# Patient Record
Sex: Male | Born: 1955 | Race: White | Hispanic: No | Marital: Single | Smoking: Former smoker
Health system: Southern US, Community
[De-identification: ages and names within clinical notes are randomized; demographics above are authoritative.]

## PROBLEM LIST (undated history)

## (undated) DIAGNOSIS — E669 Obesity, unspecified: Secondary | ICD-10-CM

## (undated) DIAGNOSIS — K219 Gastro-esophageal reflux disease without esophagitis: Secondary | ICD-10-CM

## (undated) DIAGNOSIS — I10 Essential (primary) hypertension: Secondary | ICD-10-CM

## (undated) DIAGNOSIS — E785 Hyperlipidemia, unspecified: Secondary | ICD-10-CM

## (undated) DIAGNOSIS — R0602 Shortness of breath: Secondary | ICD-10-CM

## (undated) DIAGNOSIS — I959 Hypotension, unspecified: Secondary | ICD-10-CM

## (undated) DIAGNOSIS — N183 Chronic kidney disease, stage 3 (moderate): Secondary | ICD-10-CM

## (undated) DIAGNOSIS — I2510 Atherosclerotic heart disease of native coronary artery without angina pectoris: Secondary | ICD-10-CM

## (undated) DIAGNOSIS — Z87891 Personal history of nicotine dependence: Secondary | ICD-10-CM

## (undated) DIAGNOSIS — T462X5A Adverse effect of other antidysrhythmic drugs, initial encounter: Secondary | ICD-10-CM

## (undated) DIAGNOSIS — N189 Chronic kidney disease, unspecified: Secondary | ICD-10-CM

## (undated) DIAGNOSIS — I739 Peripheral vascular disease, unspecified: Secondary | ICD-10-CM

## (undated) DIAGNOSIS — Z9989 Dependence on other enabling machines and devices: Secondary | ICD-10-CM

## (undated) DIAGNOSIS — I779 Disorder of arteries and arterioles, unspecified: Secondary | ICD-10-CM

## (undated) DIAGNOSIS — F329 Major depressive disorder, single episode, unspecified: Secondary | ICD-10-CM

## (undated) DIAGNOSIS — J984 Other disorders of lung: Secondary | ICD-10-CM

## (undated) DIAGNOSIS — I428 Other cardiomyopathies: Secondary | ICD-10-CM

## (undated) DIAGNOSIS — F32A Depression, unspecified: Secondary | ICD-10-CM

## (undated) DIAGNOSIS — G4733 Obstructive sleep apnea (adult) (pediatric): Secondary | ICD-10-CM

## (undated) DIAGNOSIS — I4819 Other persistent atrial fibrillation: Secondary | ICD-10-CM

## (undated) DIAGNOSIS — Z7901 Long term (current) use of anticoagulants: Secondary | ICD-10-CM

## (undated) DIAGNOSIS — E781 Pure hyperglyceridemia: Secondary | ICD-10-CM

## (undated) HISTORY — DX: Dependence on other enabling machines and devices: Z99.89

## (undated) HISTORY — DX: Other disorders of lung: T46.2X5A

## (undated) HISTORY — DX: Long term (current) use of anticoagulants: Z79.01

## (undated) HISTORY — DX: Hyperlipidemia, unspecified: E78.5

## (undated) HISTORY — DX: Atherosclerotic heart disease of native coronary artery without angina pectoris: I25.10

## (undated) HISTORY — DX: Peripheral vascular disease, unspecified: I73.9

## (undated) HISTORY — DX: Obesity, unspecified: E66.9

## (undated) HISTORY — DX: Pure hyperglyceridemia: E78.1

## (undated) HISTORY — DX: Gastro-esophageal reflux disease without esophagitis: K21.9

## (undated) HISTORY — DX: Essential (primary) hypertension: I10

## (undated) HISTORY — DX: Depression, unspecified: F32.A

## (undated) HISTORY — DX: Personal history of nicotine dependence: Z87.891

## (undated) HISTORY — DX: Other persistent atrial fibrillation: I48.19

## (undated) HISTORY — DX: Disorder of arteries and arterioles, unspecified: I77.9

## (undated) HISTORY — DX: Chronic kidney disease, unspecified: N18.9

## (undated) HISTORY — DX: Major depressive disorder, single episode, unspecified: F32.9

## (undated) HISTORY — DX: Obstructive sleep apnea (adult) (pediatric): G47.33

## (undated) HISTORY — DX: Other cardiomyopathies: I42.8

## (undated) HISTORY — DX: Other disorders of lung: J98.4

## (undated) HISTORY — DX: Chronic kidney disease, stage 3 (moderate): N18.3

---

## 1981-07-04 HISTORY — PX: APPENDECTOMY: SHX54

## 1997-10-02 ENCOUNTER — Encounter (HOSPITAL_COMMUNITY): Admission: RE | Admit: 1997-10-02 | Discharge: 1997-12-31 | Payer: Self-pay | Admitting: Cardiology

## 1997-10-11 ENCOUNTER — Inpatient Hospital Stay (HOSPITAL_COMMUNITY): Admission: EM | Admit: 1997-10-11 | Discharge: 1997-10-16 | Payer: Self-pay | Admitting: Emergency Medicine

## 1997-10-29 ENCOUNTER — Ambulatory Visit (HOSPITAL_COMMUNITY): Admission: RE | Admit: 1997-10-29 | Discharge: 1997-10-29 | Payer: Self-pay | Admitting: Family Medicine

## 1998-09-07 ENCOUNTER — Ambulatory Visit (HOSPITAL_COMMUNITY): Admission: RE | Admit: 1998-09-07 | Discharge: 1998-09-07 | Payer: Self-pay | Admitting: Cardiology

## 1998-09-07 ENCOUNTER — Encounter: Payer: Self-pay | Admitting: Cardiology

## 1999-09-09 ENCOUNTER — Ambulatory Visit (HOSPITAL_COMMUNITY): Admission: RE | Admit: 1999-09-09 | Discharge: 1999-09-09 | Payer: Self-pay | Admitting: Cardiology

## 1999-09-09 ENCOUNTER — Encounter: Payer: Self-pay | Admitting: Cardiology

## 2002-05-21 ENCOUNTER — Emergency Department (HOSPITAL_COMMUNITY): Admission: EM | Admit: 2002-05-21 | Discharge: 2002-05-21 | Payer: Self-pay | Admitting: Emergency Medicine

## 2002-05-21 ENCOUNTER — Encounter: Payer: Self-pay | Admitting: Emergency Medicine

## 2003-02-05 ENCOUNTER — Ambulatory Visit: Admission: RE | Admit: 2003-02-05 | Discharge: 2003-02-05 | Payer: Self-pay | Admitting: Family Medicine

## 2004-05-03 ENCOUNTER — Encounter: Admission: RE | Admit: 2004-05-03 | Discharge: 2004-05-03 | Payer: Self-pay | Admitting: Cardiology

## 2004-05-07 ENCOUNTER — Ambulatory Visit (HOSPITAL_COMMUNITY): Admission: RE | Admit: 2004-05-07 | Discharge: 2004-05-07 | Payer: Self-pay | Admitting: Cardiology

## 2004-05-11 ENCOUNTER — Inpatient Hospital Stay (HOSPITAL_COMMUNITY): Admission: RE | Admit: 2004-05-11 | Discharge: 2004-05-16 | Payer: Self-pay | Admitting: Cardiothoracic Surgery

## 2004-05-11 HISTORY — PX: CORONARY ARTERY BYPASS GRAFT: SHX141

## 2004-06-11 ENCOUNTER — Encounter: Admission: RE | Admit: 2004-06-11 | Discharge: 2004-06-11 | Payer: Self-pay | Admitting: Cardiothoracic Surgery

## 2005-12-16 ENCOUNTER — Ambulatory Visit: Payer: Self-pay | Admitting: Family Medicine

## 2006-03-24 ENCOUNTER — Ambulatory Visit: Payer: Self-pay | Admitting: Family Medicine

## 2006-11-10 ENCOUNTER — Ambulatory Visit: Payer: Self-pay | Admitting: Family Medicine

## 2006-11-28 ENCOUNTER — Ambulatory Visit: Payer: Self-pay | Admitting: Family Medicine

## 2007-04-06 ENCOUNTER — Ambulatory Visit: Payer: Self-pay | Admitting: Family Medicine

## 2007-05-07 ENCOUNTER — Ambulatory Visit: Payer: Self-pay | Admitting: Family Medicine

## 2007-06-29 ENCOUNTER — Ambulatory Visit: Payer: Self-pay | Admitting: Family Medicine

## 2007-10-25 ENCOUNTER — Ambulatory Visit: Payer: Self-pay | Admitting: Family Medicine

## 2007-10-26 ENCOUNTER — Ambulatory Visit: Payer: Self-pay | Admitting: Family Medicine

## 2008-01-03 ENCOUNTER — Ambulatory Visit: Payer: Self-pay | Admitting: Family Medicine

## 2008-06-30 ENCOUNTER — Ambulatory Visit: Payer: Self-pay | Admitting: Family Medicine

## 2008-08-14 ENCOUNTER — Ambulatory Visit: Payer: Self-pay | Admitting: Family Medicine

## 2009-03-10 ENCOUNTER — Ambulatory Visit: Payer: Self-pay | Admitting: Family Medicine

## 2009-08-13 ENCOUNTER — Ambulatory Visit: Payer: Self-pay | Admitting: Family Medicine

## 2009-08-25 ENCOUNTER — Encounter (INDEPENDENT_AMBULATORY_CARE_PROVIDER_SITE_OTHER): Payer: Self-pay

## 2009-08-26 ENCOUNTER — Ambulatory Visit: Payer: Self-pay | Admitting: Gastroenterology

## 2009-09-17 HISTORY — PX: NM MYOVIEW LTD: HXRAD82

## 2009-09-22 ENCOUNTER — Ambulatory Visit: Payer: Self-pay | Admitting: Gastroenterology

## 2009-09-24 ENCOUNTER — Encounter: Payer: Self-pay | Admitting: Gastroenterology

## 2010-04-09 ENCOUNTER — Ambulatory Visit: Payer: Self-pay | Admitting: Family Medicine

## 2010-04-26 ENCOUNTER — Ambulatory Visit: Payer: Self-pay | Admitting: Family Medicine

## 2010-08-03 NOTE — Miscellaneous (Signed)
Summary: Lec previsit  Clinical Lists Changes  Medications: Added new medication of MOVIPREP 100 GM  SOLR (PEG-KCL-NACL-NASULF-NA ASC-C) As per prep instructions. - Signed Rx of MOVIPREP 100 GM  SOLR (PEG-KCL-NACL-NASULF-NA ASC-C) As per prep instructions.;  #1 x 0;  Signed;  Entered by: Ulis Rias RN;  Authorized by: Mardella Layman MD [facility];  Method used: [facility] [phone] *, [address] [place], [place]  [address], Ph: [phone] or [phone], Fax: [phone] Observations: Added new observation of NKA: T (08/26/2009 14:19)    Prescriptions: MOVIPREP 100 GM  SOLR (PEG-KCL-NACL-NASULF-NA ASC-C) As per prep instructions.  #1 x 0   Entered by:   Ulis Rias RN   Authorized by:   Mardella Layman MD [facility]   Signed by:   Ulis Rias RN on 08/26/2009   Method used:   [facility]ronically to        [facility] [phone] * (retail)       [address]       [place], [place]  [address]       Ph: [phone] or [phone]       Fax: [phone]   RxID:   [id number]

## 2010-08-03 NOTE — Letter (Signed)
Summary: [facility] Instructions  [facility]  [address] [place], [place] [address]   Phone: [phone]  Fax: [phone]       Kennie Galati    September 25, 1955    MRN: [id number]        Procedure Day /Date:  Tuesday 09/22/2009     Arrival Time: 8:00 am      Procedure Time: 9:00 am     Location of Procedure:                    _x _  [facility] (4th Floor)                        PREPARATION FOR COLONOSCOPY WITH MOVIPREP   Starting 5 days prior to your procedure Thursday 3/17 do not eat nuts, seeds, popcorn, corn, beans, peas,  salads, or any raw vegetables.  Do not take any fiber supplements (e.g. Metamucil, Citrucel, and Benefiber).  THE DAY BEFORE YOUR PROCEDURE         DATE: Monday 3/21 1.  Drink clear liquids the entire day-NO SOLID FOOD  2.  Do not drink anything colored red or purple.  Avoid juices with pulp.  No orange juice.  3.  Drink at least 64 oz. (8 glasses) of fluid/clear liquids during the day to prevent dehydration and help the prep work efficiently.  CLEAR LIQUIDS INCLUDE: Water Jello Ice Popsicles Tea (sugar ok, no milk/cream) Powdered fruit flavored drinks Coffee (sugar ok, no milk/cream) Gatorade Juice: apple, white grape, white cranberry  Lemonade Clear bullion, consomm, broth Carbonated beverages (any kind) Strained chicken noodle soup Hard Candy                             4.  In the morning, mix first dose of MoviPrep solution:    Empty 1 Pouch A and 1 Pouch B into the disposable container    Add lukewarm drinking water to the top line of the container. Mix to dissolve    Refrigerate (mixed solution should be used within 24 hrs)  5.  Begin drinking the prep at 5:00 p.m. The MoviPrep container is divided by 4 marks.   Every 15 minutes drink the solution down to the next mark (approximately 8 oz) until the full liter is complete.   6.  Follow completed prep with 16 oz of clear liquid of your choice (Nothing red  or purple).  Continue to drink clear liquids until bedtime.  7.  Before going to bed, mix second dose of MoviPrep solution:    Empty 1 Pouch A and 1 Pouch B into the disposable container    Add lukewarm drinking water to the top line of the container. Mix to dissolve    Refrigerate  THE DAY OF YOUR PROCEDURE      DATE: Tuesday 3/22  Beginning at 4:00 a.m. (5 hours before procedure):         1. Every 15 minutes, drink the solution down to the next mark (approx 8 oz) until the full liter is complete.  2. Follow completed prep with 16 oz. of clear liquid of your choice.    3. You may drink clear liquids until 7:00 am  (2 HOURS BEFORE PROCEDURE).   MEDICATION INSTRUCTIONS  Unless otherwise instructed, you should take regular prescription medications with a small sip of water   as early as possible the morning of  your procedure..  .  Additional medication instructions: Do not take Diovan am of procedure         OTHER INSTRUCTIONS  You will need a responsible adult at least 56 years of age to accompany you and drive you home.   This person must remain in the waiting room during your procedure.  Wear loose fitting clothing that is easily removed.  Leave jewelry and other valuables at home.  However, you may wish to bring a book to read or  an iPod/MP3 player to listen to music as you wait for your procedure to start.  Remove all body piercing jewelry and leave at home.  Total time from sign-in until discharge is approximately 2-3 hours.  You should go home directly after your procedure and rest.  You can resume normal activities the  day after your procedure.  The day of your procedure you should not:   Drive   Make legal decisions   Operate machinery   Drink alcohol   Return to work  You will receive specific instructions about eating, activities and medications before you leave.    The above instructions have been reviewed and explained to me by   Ulis Rias RN  August 26, 2009 2:36 PM     I fully understand and can verbalize these instructions _____________________________ Date _________

## 2010-08-03 NOTE — Procedures (Signed)
Summary: Colonoscopy  Patient: [name] [name] Note: All result statuses are Final unless otherwise noted.  Tests: (1) Colonoscopy (COL)   COL Colonoscopy           DONE     [facility]     [address]     [place], [place]  [address]           COLONOSCOPY PROCEDURE REPORT           PATIENT:  [name] [name], [name] [name]  MR#:  [id number]     BIRTHDATE:  1955-08-31, 53 yrs. old  GENDER:  male           ENDOSCOPIST:  Refoel Palladino. Jarold Motto, MD, [facility]     Referred by:  Sharlot Gowda, M.D.           PROCEDURE DATE:  09/22/2009     PROCEDURE:  Colonoscopy with snare polypectomy     ASA CLASS:  Class II     INDICATIONS:  Routine Risk Screening           MEDICATIONS:   Fentanyl 50 mcg IV, Versed 5 mg           DESCRIPTION OF PROCEDURE:   After the risks benefits and     alternatives of the procedure were thoroughly explained, informed     consent was obtained.  Digital rectal exam was performed and     revealed no abnormalities.   The LB CF-[id number] endoscope     was introduced through the anus and advanced to the cecum, which     was identified by both the appendix and ileocecal valve, without     limitations.  The quality of the prep was excellent, using     MoviPrep.  The instrument was then slowly withdrawn as the colon     was fully examined.     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     Moderate diverticulosis was found sigmoid to descending  There     were multiple polyps identified and removed. sigmoid to descending     2-6 mm flat polyps hot snare excised  This was otherwise a normal     examination of the colon.   Retroflexed views in the rectum     revealed no abnormalities.    The scope was then withdrawn from     the patient and the procedure completed.           COMPLICATIONS:  None           ENDOSCOPIC IMPRESSION:     1) Av malformation     2)  Moderate diverticulosis in the sigmoid to descending     3) Polyps, multiple in the sigmoid to descending     4) Otherwise normal examination     RECOMMENDATIONS:     1) If the polyp(s) removed today are proven to be adenomatous     (pre-cancerous) polyps, you will need a repeat colonoscopy in 5     years. Otherwise you should continue to follow colorectal cancer     screening guidelines for "routine risk" patients with colonoscopy     in 10 years.     2) high fiber diet           REPEAT EXAM:  No  ______________________________     Vania Rea. Jarold Motto, MD, [facility]           CC:           n.     eSIGNED:   Vania Rea. Patterson at 09/22/2009 09:26 AM           Marjorie Smolder, [id number]  Note: An exclamation mark (!) indicates a result that was not dispersed into the flowsheet. Document Creation Date: 09/22/2009 9:26 AM _______________________________________________________________________  (1) Order result status: Final Collection or observation date-time: 09/22/2009 09:19 Requested date-time:  Receipt date-time:  Reported date-time:  Referring Physician:   Ordering Physician: Sheryn Bison [phone]) Specimen Source:  Source: Launa Grill Order Number: [id number] Lab site:   Appended Document: Colonoscopy     Procedures Next Due Date:    Colonoscopy: 09/2014

## 2010-08-03 NOTE — Letter (Signed)
Summary: Patient Notice- Polyp Results  [facility]  [address] [place], [place] [address]   Phone: [phone]  Fax: [phone]        September 24, 2009 MRN: [id number]    [name] [name] [address] [place], [place]  [address]    Dear Mr. Mertens,  I am pleased to inform you that the colon polyp(s) removed during your recent colonoscopy was (were) found to be benign (no cancer detected) upon pathologic examination.  I recommend you have a repeat colonoscopy examination in 5_ years to look for recurrent polyps, as having colon polyps increases your risk for having recurrent polyps or even colon cancer in the future.  Should you develop new or worsening symptoms of abdominal pain, bowel habit changes or bleeding from the rectum or bowels, please schedule an evaluation with either your primary care physician or with me.  Additional information/recommendations:  xx__ No further action with gastroenterology is needed at this time. Please      follow-up with your primary care physician for your other healthcare      needs.  __ Please call [phone] to schedule a return visit to review your      situation.  __ Please keep your follow-up visit as already scheduled.  __ Continue treatment plan as outlined the day of your exam.  Please call us if you are having persistent problems or have questions about your condition that have not been fully answered at this time.  Sincerely,  Mardella Layman MD [facility]  This letter has been electronically signed by your physician.  Appended Document: Patient Notice- Polyp Results Letter mailed 3.25.11

## 2010-08-09 ENCOUNTER — Encounter (INDEPENDENT_AMBULATORY_CARE_PROVIDER_SITE_OTHER): Payer: Managed Care, Other (non HMO) | Admitting: Family Medicine

## 2010-08-09 DIAGNOSIS — I10 Essential (primary) hypertension: Secondary | ICD-10-CM

## 2010-08-09 DIAGNOSIS — F17200 Nicotine dependence, unspecified, uncomplicated: Secondary | ICD-10-CM

## 2010-08-09 DIAGNOSIS — I2510 Atherosclerotic heart disease of native coronary artery without angina pectoris: Secondary | ICD-10-CM

## 2010-08-09 DIAGNOSIS — Z79899 Other long term (current) drug therapy: Secondary | ICD-10-CM

## 2010-09-02 ENCOUNTER — Ambulatory Visit (INDEPENDENT_AMBULATORY_CARE_PROVIDER_SITE_OTHER): Payer: Managed Care, Other (non HMO) | Admitting: Family Medicine

## 2010-09-02 DIAGNOSIS — T887XXA Unspecified adverse effect of drug or medicament, initial encounter: Secondary | ICD-10-CM

## 2010-11-16 ENCOUNTER — Other Ambulatory Visit: Payer: Self-pay | Admitting: Cardiology

## 2010-11-16 ENCOUNTER — Ambulatory Visit
Admission: RE | Admit: 2010-11-16 | Discharge: 2010-11-16 | Disposition: A | Discharge status: Home / Self Care | Payer: Managed Care, Other (non HMO) | Source: Ambulatory Visit | Attending: Cardiology | Admitting: Cardiology

## 2010-11-16 DIAGNOSIS — I2510 Atherosclerotic heart disease of native coronary artery without angina pectoris: Secondary | ICD-10-CM

## 2010-11-18 ENCOUNTER — Ambulatory Visit (HOSPITAL_COMMUNITY)
Admission: RE | Admit: 2010-11-18 | Discharge: 2010-11-19 | Disposition: A | Discharge status: Home / Self Care | Payer: Managed Care, Other (non HMO) | Source: Ambulatory Visit | Attending: Cardiology | Admitting: Cardiology

## 2010-11-18 DIAGNOSIS — F3289 Other specified depressive episodes: Secondary | ICD-10-CM | POA: Insufficient documentation

## 2010-11-18 DIAGNOSIS — Z9861 Coronary angioplasty status: Secondary | ICD-10-CM | POA: Insufficient documentation

## 2010-11-18 DIAGNOSIS — E785 Hyperlipidemia, unspecified: Secondary | ICD-10-CM | POA: Insufficient documentation

## 2010-11-18 DIAGNOSIS — I10 Essential (primary) hypertension: Secondary | ICD-10-CM | POA: Insufficient documentation

## 2010-11-18 DIAGNOSIS — I739 Peripheral vascular disease, unspecified: Secondary | ICD-10-CM | POA: Insufficient documentation

## 2010-11-18 DIAGNOSIS — Q602 Renal agenesis, unspecified: Secondary | ICD-10-CM | POA: Insufficient documentation

## 2010-11-18 DIAGNOSIS — R9439 Abnormal result of other cardiovascular function study: Secondary | ICD-10-CM | POA: Insufficient documentation

## 2010-11-18 DIAGNOSIS — F17200 Nicotine dependence, unspecified, uncomplicated: Secondary | ICD-10-CM | POA: Insufficient documentation

## 2010-11-18 DIAGNOSIS — Z951 Presence of aortocoronary bypass graft: Secondary | ICD-10-CM | POA: Insufficient documentation

## 2010-11-18 DIAGNOSIS — I2510 Atherosclerotic heart disease of native coronary artery without angina pectoris: Secondary | ICD-10-CM | POA: Insufficient documentation

## 2010-11-18 DIAGNOSIS — Q605 Renal hypoplasia, unspecified: Secondary | ICD-10-CM | POA: Insufficient documentation

## 2010-11-18 DIAGNOSIS — F329 Major depressive disorder, single episode, unspecified: Secondary | ICD-10-CM | POA: Insufficient documentation

## 2010-11-18 HISTORY — PX: CARDIAC CATHETERIZATION: SHX172

## 2010-11-18 LAB — BASIC METABOLIC PANEL
BUN: 32 mg/dL — ABNORMAL HIGH (ref 6–23)
CO2: 25 mEq/L (ref 19–32)
Calcium: 9.2 mg/dL (ref 8.4–10.5)
Chloride: 99 mEq/L (ref 96–112)
Creatinine, Ser: 1.86 mg/dL — ABNORMAL HIGH (ref 0.4–1.5)
GFR calc Af Amer: 46 mL/min — ABNORMAL LOW (ref >60)
GFR calc non Af Amer: 38 mL/min — ABNORMAL LOW (ref >60)
Glucose, Bld: 136 mg/dL — ABNORMAL HIGH (ref 70–99)
Potassium: 3.8 mEq/L (ref 3.5–5.1)
Sodium: 134 mEq/L — ABNORMAL LOW (ref 135–145)

## 2010-11-19 LAB — BASIC METABOLIC PANEL
BUN: 27 mg/dL — ABNORMAL HIGH (ref 6–23)
CO2: 27 mEq/L (ref 19–32)
Calcium: 8.8 mg/dL (ref 8.4–10.5)
Chloride: 101 mEq/L (ref 96–112)
Creatinine, Ser: 1.67 mg/dL — ABNORMAL HIGH (ref 0.4–1.5)
GFR calc Af Amer: 52 mL/min — ABNORMAL LOW (ref >60)
GFR calc non Af Amer: 43 mL/min — ABNORMAL LOW (ref >60)
Glucose, Bld: 107 mg/dL — ABNORMAL HIGH (ref 70–99)
Potassium: 4.3 mEq/L (ref 3.5–5.1)
Sodium: 137 mEq/L (ref 135–145)

## 2010-11-19 NOTE — Discharge Summary (Signed)
NAME:  ANIELLO, CHRISTOPOULOS NO.:  [id number]   MEDICAL RECORD NO.:  [id number]          PATIENT TYPE:  INP   LOCATION:  2023                         FACILITY:  [facility]   PHYSICIAN:  Kerin Perna, M.D.  DATE OF BIRTH:  1955/07/25   DATE OF ADMISSION:  05/11/2004  DATE OF DISCHARGE:  05/16/2004                                 DISCHARGE SUMMARY   ANTICIPATED DATE OF DISCHARGE:  May 16, 2004.   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   DISCHARGE/SECONDARY DIAGNOSES:  1.  Class IV progressive angina with severe three-vessel coronary artery      disease.  2.  Hypertension.  3.  Gout.  4.  Tobacco dependence.  5.  Status post cervical laminectomy for degenerative arthritis.  6.  Status post appendectomy.  7.  Gastrointestinal reflux disease.  8.  Hyperlipidemia.  9.  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  No renal artery stenosis identified.  10. No known drug allergies.  11. Postoperative left base consolidation with history of recent pneumonia.      He was started on a 10-day course of Avelox on May 14, 2004.  12. History of carotid artery bruits with carotid Dopplers at [facility] in October of 2005 showing no significant      carotid artery disease.   PROCEDURES:  On May 11, 2004, coronary artery bypass graft x 4 using a  left internal mammary artery to the left anterior descending, saphenous vein  graft to the diagonal, sequential saphenous vein graft to the first obtuse  marginal and second obtuse marginal, endoscopic vein harvesting from the  right leg.  Surgeon:  Kerin Perna, M.D.   BRIEF HISTORY:  Mr. Dragan is a 55 year old Caucasian male with a history of  smoking.  He has a prior history of coronary artery disease and anterior  myocardial infarction in 1999.  He was treated with an LAD stent.  He  saw  Gaspar Garbe B. Little, M.D., in October of this year with recurrent chest pain.  He had a positive stress test and was therefore scheduled for a cardiac  catheterization by Dr. Clarene Duke, which was done on May 07, 2004.  This  demonstrated restenosis of the LAD and high-grade disease of the dominant  circumflex system.  His ejection fraction was preserved and he was felt to  be a candidate for surgical revascularization.  Kerin Perna, M.D., of  [facility] was consulted.  Dr. Donata Clay examined the patient in the post  catheterization holding area to review the results of the cardiac  catheterization with the patient.  He felt that Mr. Reicher would benefit  from coronary artery bypass graft surgery.  After discussing the risks,  benefits and alternatives, Mr. Gaertner agreed to proceed.  Both Dr. Donata Clay  and Dr. Clarene Duke felt that Mr. Greenstreet was stable for discharge home with plans  to proceed with surgery on May 11, 2004.   HOSPITAL  COURSE:  On May 11, 2004, Mr. Hudock was electively admitted to  [facility] to undergo coronary artery bypass grafting as discussed  above.  Overall he tolerated the procedure well.  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  Dr. Donata Clay felt that exposure of any vessel other  than the LAD for future operations or followup would be futile.  He did not  receive any blood products during the operation.  The mammary artery was a  small vessel with good flow.  The quality of the vein harvested was good.  Postoperatively he was transferred to the surgical intensive care unit.  Later that evening, he remained sedated on the ventilator.  His chest tube  output was minimal.  Postoperative laboratories were stable other than a  slightly elevated blood glucose.  His chest x-ray was also stable.  It was  noted by anesthesia that he was a difficult intubation.  Precautions were   taken prior to extubating him.  However, by postoperative day #1, he had  been extubated and was neurologically intact.  He was hemodynamically stable  in sinus rhythm.  He did show evidence of mild volume excess and diuretic  therapy was initiated.  His followup EKG showed normal sinus rhythm with T-  wave inversion in leads 2, 3 and aVF.  Serial cardiac enzymes were checked  as a precautions.  Although his troponin was elevated at 3.2, it was noted  to decrease to 2.7.  This was felt stable considering he had just undergone  coronary artery bypass grafting.  Otherwise Mr. Eves denied chest pain  other than incisional soreness.  Mr. Dondero remained in the intensive care  unit until postoperative day #2.   By postoperative day #3, Mr. Siegman was on unit 2000.  He was  hemodynamically stable.  His blood pressure was 136/80.  His heart rate was  in the 80s and in sinus rhythm with occasional premature atrial  contractions.  His Lopressor had just been increased to 25 mg twice daily.  He was afebrile.  He was on 2 L per nasal cannula saturating 97%.  Laboratories showed a decreasing blood glucose at 115.  His H&H were stable  at 10.9 and 31.2.  Kidney function was also stable and he was voiding  without difficulty.  His morning chest x-ray, however, showed a tiny left  apical pneumothorax after removal of his chest tubes.  There were also  effusions with mild interstitial edema and a consolidation at the left base.  He was also noted to have a coarse cough.  It was felt he might be  developing a postoperative bronchitis and he was empirically started on  Avelox and methophenazine.  He was also treated with albuterol and Atrovent  nebulizer treatments postoperatively.  On exam, his heart had a regular rate  and rhythm.  His lungs had a few intermittent faint wheezes throughout.  His abdominal exam was benign.  His bowels were working appropriately.  His  extremities showed trace edema.  His  incisions were clean and dry without  signs of infection.  By this time, he was ambulating out in the hallways  with cardiac rehabilitation and was noted to have a steady gait.  He had  also undergo a tobacco cessation consult.  It was felt that if his  respiratory status remained stable over the next 48 hours, he was weaned  from  supplementation oxygen and his heart rhythm remained stable that he  would be ready for discharge home on May 16, 2004.   LABORATORY DATA:  Laboratories on May 14, 2004, showed a decreasing  white blood count of 17.2, a hemoglobin of 11.6, hematocrit 33.7 and  platelet count 168.  Sodium 136, potassium 4.0, blood glucose 115, BUN 16,  creatinine 1.0, chloride 99 and CO2 29.  Liver enzymes on May 07, 2004,  showed a total bilirubin of 0.6, alkaline phosphatase 96, SGOT 14 and SGPT  14.  His total protein was 6.0, albumin 3.3 and calcium 9.0.   DISCHARGE MEDICATIONS:  1.  Enteric-coated aspirin 325 mg one p.o. daily.  2.  Lopressor 25 mg one p.o. b.i.d.  3.  Lipitor 40 mg one p.o. daily.  4.  Allopurinol 300 mg one p.o. daily.  5.  Buspirone 50 mg one p.o. b.i.d.  6.  Celexa 20 mg one p.o. daily.  7.  Prevacid 30 mg one p.o. daily.  8.  Avelox 400 mg one p.o. daily x 7 days.  9.  Guaifenesin 600 mg one p.o. q.12h. p.r.n.  10. Albuterol MDI two puffs q.4h. p.r.n.  11. Tylox one to two tablets p.o. q.4h. p.r.n. pain.   ACTIVITY:  He is instructed to avoid driving or heavy lifting more than 10  pounds.  He was encouraged to continue daily walking and breathing  exercises.   DIET:  He is to follow a low-fat, low-salt diet.   WOUND CARE:  He may shower daily and clean his incisions gently with mild  soap and water.  He should notify the [facility] office if he develops a fever  greater than 101 degrees or redness or drainage from his incision site.   FOLLOWUP:  He is to call to schedule a two-week followup with Gaspar Garbe B.  Little, M.D.  He is to  follow up with Dr. Donata Clay at the [facility] office in  approximately three weeks.  The [facility] office will contact him regarding a  specific appointment date and time.  He is to have a chest x-ray one before  this appointment at the [facility].  He was instructed to  bring his chest x-ray films with him to his appointment with Dr. Donata Clay.  He should schedule a two to four-week followup to see Sharlot Gowda, M.D., to  reevaluate his blood sugars and address any other medical issues.      Revonda Standard   AWZ/MEDQ  D:  05/14/2004  T:  05/15/2004  Job:  [id number]   cc:   Thereasa Solo. Little, M.D.  [address]  [address]  [place]  [place] [address]  Fax: [phone]   Sharlot Gowda, M.D.  [address]  [place], [place] [address]  Fax: [phone]

## 2010-11-19 NOTE — Cardiovascular Report (Signed)
NAME:  [name] [name], [name] [name] NO.:  [id number]   MEDICAL RECORD NO.:  [id number]          PATIENT TYPE:  OIB   LOCATION:  2899                         FACILITY:  [facility]   PHYSICIAN:  Thereasa Solo. Little, M.D. DATE OF BIRTH:  13-Jul-1955   DATE OF PROCEDURE:  05/07/2004  DATE OF DISCHARGE:                              CARDIAC CATHETERIZATION   This 55 year old male has known coronary disease having had a stent placed  in his LAD in 1999.  He had somewhat atypical chest pain and was evaluated  on April 12, 2004.  Nuclear study was performed that showed subtle  anterior ischemia and because of this the patient is brought in for  outpatient cardiac catheterization.  He is a cigarette smoker, has  hypertension, hyperlipidemia, and had a family member die at age 59 of  coronary artery disease.   PROCEDURE:  After obtaining informed consent, the patient was prepped and  draped in the usual sterile fashion.  Following local anesthetic with 1%  Xylocaine, the Seldinger technique was employed and a 5 [place] introducer  sheath was placed into the right femoral artery.  Selective left and right  coronary arteriography and ventriculography in the RAO projection,  evaluation of the left internal mammary artery, and a distal aortogram was  performed.   COMPLICATIONS:  None.   EQUIPMENT:  5 French diagnostic Judkins configuration catheters.   TOTAL CONTRAST:  120 mL.   RESULTS:   HEMODYNAMIC MONITORING:  Central aortic pressure was 123/74.  Left  ventricular pressure was 120/13.  There was no aortic valve gradient noted  at time of pullback.   VENTRICULOGRAPHY:  Ventriculography in the RAO projection revealed posterior  basilar segment to be mildly hypokinetic.  The ejection fraction was 50%.  The end-diastolic pressure was 27, and there was mitral valve prolapse  without mitral regurgitation seen.   DISTAL AORTOGRAM:  Distal aortogram showed a single renal artery to the  right  kidney with no renal artery stenosis.  There is no left kidney.  There  was no significant iliac disease.   Evaluation of the left internal mammary artery showed it to be widely  patent.   On fluoroscopy, there was a stent in the LAD and mild calcification in the  same vessel on fluoro.   1.  Left main normal.  2.  LAD:  The left anterior descending was a very large dominant vessel.  It      crossed the apex of the heart and supplied the entire posterior septum.      There was stent in the most proximal portion almost at the ostium.  In      the distal portion of the stent was a napkin-ring hypolucent area.      There was TIMI-3 flow through this.  There was a small first diagonal.  3.  Circumflex:  The circumflex was a dominant vessel.  The ostium of the      first OM was 70% narrowed and this was a large vessel.  The ongoing      circumflex at the takeoff of OM-2  had a long 90% area proximal to and      distal to the takeoff of OM-2, and OM-2 was a large vessel.  The ostium      of this vessel was also involved.  The ongoing circumflex past this      lesion appeared to be free of significant disease.  4.  The right coronary artery was a small nondominant vessel with moderate      irregularities.   CONCLUSION:  1.  Low normal left ventricular function with 50% ejection fraction.  2.  Elevated left ventricular end-diastolic pressure at 27.  3.  Single right kidney.  4.  No significant aortoiliac disease.  5.  Patent left internal mammary artery.  6.  Napkin-ring lucent area in the terminal portion of the stent in the      proximal left anterior descending in a very dominant left anterior      descending and severe disease in the circumflex involving the ostium of      OM-1 and the ostium of OM-2 with long lesion in the circumflex itself.   At this point, I have recommended he be evaluated by [facility] for consideration  of revascularization.  I would consider letting him go home and  come back  first of the week for revascularization.       ABL/MEDQ  D:  05/07/2004  T:  05/07/2004  Job:  [id number]   cc:   Sharlot Gowda, M.D.  [address]  [place], [place] [address]  Fax: [phone]

## 2010-11-19 NOTE — Consult Note (Signed)
NAME:  [name] [name], WISLER NO.:  [id number]   MEDICAL RECORD NO.:  [id number]          PATIENT TYPE:  OIB   LOCATION:  2899                         FACILITY:  [facility]   PHYSICIAN:  Kathlee Nations Trigt III, M.D.DATE OF BIRTH:  Jul 15, 1955   DATE OF CONSULTATION:  05/07/2004  DATE OF DISCHARGE:                                   CONSULTATION   PHYSICIAN REQUESTING CONSULTATION:  Thereasa Solo. Little, M.D.   PRIMARY CARE PHYSICIAN:  Sharlot Gowda, M.D.   REASON FOR CONSULTATION:  Severe three-vessel coronary artery disease with  class IV angina.   CHIEF COMPLAINT:  Chest pain.   HISTORY OF PRESENT ILLNESS:  I was asked to evaluate this 55 year old white  male for potential surgical coronary revascularization for recently  diagnosed severe three-vessel coronary artery disease.  The patient has a  history of coronary artery disease and sustained an inferior MI in 1999.  At  that time, he was treated with a stent in the proximal LAD, which is a  dominant vessel wrapping around the apex and supplying the inferior wall.  He has been followed by Dr. Clarene Duke with intermittent stress tests.  A  cardiac cath was last done in 2003.  Over the past several weeks, he has had  exertional chest pain relieved by rest.  A stress test showed possible  ischemic changes from his prior studies and a diagnostic catheterization was  performed today.  This demonstrated ejection fraction of 50% with inferior  hypokinesia.  His LVEDP was 24 mmHg.  His LAD was a large dominant vessel  supplying the apex and the inferior wall, and septum.  There is a stenosis  in the proximal stent.  The circumflex was a large vessel with 70% stenosis  of the OM-1, 90% stenosis of the OM-2 and 80% stenosis of the distal  circumflex.  The right coronary was non-dominant and small.  There is no  evidence of aortic regurgitation or mitral regurgitation.  Based on the  patient's coronary anatomy, symptoms, and stress test, he  was felt to be a  candidate for surgical revascularization.  He is currently stable in the  post-cath holding area.   PAST MEDICAL HISTORY:  1.  Hypertension.  2.  Gout.  3.  Active smoking.  4.  No known drug allergies.  5.  Status post cervical laminectomy for degenerative arthritis.  6.  Status post appendectomy.  7.  GERD.   HOME MEDICATIONS:  1.  Lipitor 40 mg daily.  2.  Allopurinol 300 mg daily.  3.  Hydrochlorothiazide 12.5 mg daily.  4.  Diovan 160 mg daily.  5.  Celexa 20 mg daily.  6.  Atenolol 25 mg daily.  7.  Prevacid 30 mg daily.  8.  Aspirin 81 mg daily.  9.  Buspirone 15 mg b.i.d.   SOCIAL HISTORY:  The patient is employed as a Control and instrumentation engineer.  He travels extensively.  He smokes 1 pack of cigarettes a day and he is a  nonuser of alcohol.  He is not married, but has friends which will help with  his postoperative support.   FAMILY HISTORY:  Family history negative for stroke, diabetes or cancer,  positive for coronary disease.   REVIEW OF SYSTEMS:  He denies any fever or weight loss.  He denies any  active dental problems or difficulty swallowing.  He denies headaches or  change in vision.  Pulmonary review is negative for thoracic trauma or  history of abnormal chest x-ray.  Cardiac review is positive for his history  of coronary disease since 1999.  GI review is negative for GI bleeding,  hepatitis or jaundice.  Musculoskeletal review is positive for his cervical  laminectomy, now resolved neck pain, and vascular review is significant for  normal carotid studies in the past, and normal lower extremity ABI.  Neurologic review is negative for seizure or stroke.   PHYSICAL EXAM:  VITALS:  The patient is 5 foot 9 inches and weighs 210  pounds.  Blood pressure 145/72, heart rate 80 and regular, respirations 18.  GENERAL:  General appearance is that of a pleasant, middle-aged white male  in the hospital following cardiac catheterization, in no  acute distress.  HEENT:  Exam reveals him to be normocephalic with the exception of a  recessed chin and jaw, and a wide neck which is short.  MUSCULOSKELETAL:  His body morphology indicates short arm and legs.  NECK:  Neck is without mass, JVD or carotid bruit.  LYMPHATICS:  Lymphatics reveal no palpable supraclavicular or axillary  adenopathy.  LUNGS:  Lungs are clear.  CARDIAC:  Exam reveals a regular rhythm without S3, gallop, murmur or rub.  ABDOMINAL EXAM:  Soft, nontender, without organomegaly.  EXTREMITIES:  Extremities reveal 2+ pulses.  He has no cyanosis, clubbing or  edema.  SKIN:  Skin is without rash or lesion.  RECTAL:  Exam is deferred.  NEUROLOGIC:  He is alert and oriented without focal motor deficit.   LABORATORY AND ACCESSORY CLINICAL DATA:  His chest x-ray performed on  May 03, 2004 indicates cardiomegaly consistent with hypertension with  mild bronchovascular markings consistent with chronic bronchitis, no acute  infiltrate.   His carotid Doppler studies performed at [facility] in October of this year indicate no significant carotid disease.   His blood sugar is 138, his hematocrit is 44%, and his BUN and creatinine  are normal.   IMPRESSION AND PLAN:  The patient has significant three-vessel coronary  artery disease with symptoms of exertional angina which are progressive.  He  is not a candidate for percutaneous intervention and surgical  revascularization would provide him with good long-term relief of symptoms  and preservation of left ventricular function, and improved survival.   We will plan surgical revascularization with bypass grafts to his left  anterior descending, first obtuse marginal, second obtuse marginal and  distal circumflex on Tuesday, May 11, 2004.  I have discussed the  indications and expected benefits of surgery with Mr. Ladamien Rammel, and he understands the alternatives and associated risks as  well  (bleeding, myocardial infarction, stroke, infection, and death).  He agrees  to proceed with the operation under what I feel is an informed consent.   Thank you very much for the consultation.      Pete   PV/MEDQ  D:  05/07/2004  T:  05/07/2004  Job:  [id number]   cc:   [facility]   Sharlot Gowda, M.D.  [address]  [place], [place] [address]  Fax: [phone]   CVTS Office

## 2010-11-19 NOTE — Op Note (Signed)
NAME:  CASHMERE, HARMES NO.:  [id number]   MEDICAL RECORD NO.:  [id number]          PATIENT TYPE:  INP   LOCATION:  2312                         FACILITY:  [facility]   PHYSICIAN:  Kathlee Nations Trigt III, M.D.DATE OF BIRTH:  [birth year]   DATE OF PROCEDURE:  05/11/2004  DATE OF DISCHARGE:                                 OPERATIVE REPORT   OPERATION:  Coronary artery bypass grafting x 4 (left internal mammary  artery to left anterior descending, saphenous vein graft to diagonal,  sequential saphenous vein graft to first obtuse marginal and second obtuse  marginal).   PREOPERATIVE DIAGNOSIS:  Class IV progressive angina with severe three-  vessel coronary artery disease.   POSTOPERATIVE DIAGNOSIS:  Class IV progressive angina with severe three-  vessel coronary artery disease.   SURGEON:  Kerin Perna, M.D.   ASSISTANT:  Jerold Coombe, P.A.-C.   ANESTHESIA:  General.   INDICATIONS:  The patient is a 55 year old male smoker who has a prior  history of coronary disease and an anterior MI in 1999, treated with LAD  stent.  He returned with recurrent chest pain and a positive stress test.  Cardiac catheterization by Dr. Clarene Duke demonstrated restenosis of the LAD and  high-grade disease of his dominant circumflex system.  His EF was preserved,  and he was felt to be a candidate for surgical revascularization.  Prior to  surgery, I examined the patient in the postcatheterization holding area and  reviewed the results of the cardiac catheterization with the patient.  I  discussed the indications and expected benefits of coronary artery bypass  surgery for treatment of his coronary artery disease.  I reviewed the  alternatives to surgical therapy for treatment of his coronary artery  disease and the expected outcome of those alternative therapies.  I  discussed with the patient the major aspects of the planned procedure,  including the choice of conduit to include the  mammary artery and  endoscopically harvested saphenous vein,  the use of general anesthesia and  cardiopulmonary bypass, and the expected postoperative hospital recovery.  I  discussed with him the risks to him of coronary artery bypass surgery,  including those risks of MI, CVA, bleeding, blood transfusion requirement,  infection, and death.  He understood these implications for the surgery and  agreed to proceed with the operation as planned under what I felt was an  informed consent after our lengthy discussion.   OPERATIVE FINDINGS:  The patient's body habitus made exposure of the  posterior aspect of the heart very difficult.  He has a very short aorta, a  round dilated heart, and sternal abnormality as well.  Exposure of any  vessel other than the LAD for future operations would probably be futile.  The patient received no blood products for the operation.  The mammary  artery was a small vessel, but it had good flow.  The vein was of good  quality and was harvested endoscopically from the right thigh.  The distal  circumflex vessel was in the AV groove and was too small to graft.  The  diagonal was a small vessel but was graftable.   PROCEDURE:  The patient was brought to the operating room and placed supine  on the operating table, where general anesthesia was induced under invasive  hemodynamic monitoring.  The chest, abdomen, and legs were prepped with  Betadine and draped as a sterile field.  A sternal incision was made as the  saphenous vein was harvested from the right leg endoscopically.  The  internal mammary artery was harvested as a pedicle graft from its origin at  the subclavian vessels, and it was a good vessel, with excellent flow.  Heparin was administered, and the ACT was documented as being therapeutic.  The sternal retractor was placed.  The pericardium was opened and suspended.  Pursestrings were placed in the ascending aorta and right atrium.  The  patient was  cannulated and placed on bypass and cooled to 32 degrees. The  coronaries were identified for grafting, and the mammary artery and vein  grafts were prepared for the distal anastomoses.  A cardioplegia cannula was  placed for antegrade aortic cardioplegia administration.  The patient was  cooled to 30 degrees.  The aortic cross-clamp was applied.  Seven hundred cc  total of cold blood cardioplegia was delivered to the aortic root, with good  cardioplegic arrest, and septal temperature dropping less than 12 degrees.  Topical ice saline was used to augment myocardial preservation, and a  pericardial insulator pad was used to protect the left phrenic nerve.   The distal coronary anastomoses were then performed.  The first distal  anastomosis was to the diagonal.  This was a 1.5 mm vessel, proximal 90%  calcified stenosis.  A reverse saphenous vein was sewn end-to-side with a  running 7-0 Prolene, with a good flow through the graft.  The second and  third distal anastomoses consisted of a sequential vein graft to the OM1,  continuing to the OM2.  The OM1 was a smaller 1.5 mm vessel with proximal  90% stenosis.  A side-to-side anastomosis with the vein was sewn using  running 7-0 Prolene, and there was good flow through the graft.  The third  distal anastomosis was a continuation of the sequential vein graft to the  OM2.  This was a larger 1.7 mm vessel with a proximal 80% calcified  stenosis.  The vessel was sclerotic.  An end-to-side anastomosis with the  vein was constructed using running 7-0 Prolene.  There was excellent flow  through the graft.  Cardioplegia was re-dosed.  The fourth distal  anastomosis was the distal third of the LAD.  This was a 1.8 mm vessel with  a proximal 80% stenosis.  The left internal mammary artery was brought  through an opening in the left lateral pericardium and was brought down onto the LAD and sewn end-to-side with a running 8-0 Prolene.  There was   excellent flow through the anastomosis after briefly releasing the pedicle  clamp on the mammary artery.  The mammary pedicle was secured to the  epicardium.  Cardioplegia was re-dosed.   It should be noted that the left lung was examined while harvesting the  mammary artery and bringing the mammary artery through the mediastinum.  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  These bullae are adherent to the pericardium and were not taken  down or intervened upon.   After cardioplegia was administered, two proximal vein anastomoses were  placed on the  ascending aorta while the aortic cross-clamp remained in  place.  A 4 mm punch was used with a running 6-0 Prolene.  Air was vented  from the left side of the heart before tying down the last proximal  anastomosis, and then the aortic cross-clamp was removed.   The heart resumed a spontaneous rhythm.  The patient was rewarmed to 37  degrees.  Temporary pacing wires were applied after the proximal and distal  anastomoses were checked and found to be hemostatic.  The lungs were  reexpanded, and the ventilator was resumed.  The patient was weaned from  bypass without difficulty.  Cardiac output and blood pressure were stable.  Protamine was administered without adverse reaction.  The cannulas were  removed.  The mediastinum was irrigated with warm antibiotic irrigation.  The leg incision was irrigated and closed in a standard fashion.  The  pericardium was loosely approximated superiorly.  Two mediastinal and a left  pleural chest tube were placed and brought out through separate incisions.  The sternum was closed with interrupted steel  wire.  The pectoralis fascia was closed with a running #1 Vicryl.  The  subcutaneous and skin layers were closed with a running Vicryl, and sterile  dressings were applied.  Total bypass time was 110 minutes, with cross-clamp  time of 70 minutes.      Pete    PV/MEDQ  D:  05/11/2004  T:  05/12/2004  Job:  [id number]   cc:   CVTS Office   Thereasa Solo. Little, M.D.  [address]  [address]  [place]  [place] [address]  Fax: [phone]

## 2010-11-25 ENCOUNTER — Encounter: Payer: Self-pay | Admitting: Family Medicine

## 2010-11-25 ENCOUNTER — Ambulatory Visit (INDEPENDENT_AMBULATORY_CARE_PROVIDER_SITE_OTHER): Payer: Managed Care, Other (non HMO) | Admitting: Family Medicine

## 2010-11-25 ENCOUNTER — Other Ambulatory Visit: Payer: Self-pay | Admitting: Family Medicine

## 2010-11-25 DIAGNOSIS — I152 Hypertension secondary to endocrine disorders: Secondary | ICD-10-CM | POA: Insufficient documentation

## 2010-11-25 DIAGNOSIS — E1159 Type 2 diabetes mellitus with other circulatory complications: Secondary | ICD-10-CM | POA: Insufficient documentation

## 2010-11-25 DIAGNOSIS — G4730 Sleep apnea, unspecified: Secondary | ICD-10-CM

## 2010-11-25 DIAGNOSIS — E1169 Type 2 diabetes mellitus with other specified complication: Secondary | ICD-10-CM

## 2010-11-25 DIAGNOSIS — E119 Type 2 diabetes mellitus without complications: Secondary | ICD-10-CM

## 2010-11-25 DIAGNOSIS — F32A Depression, unspecified: Secondary | ICD-10-CM | POA: Insufficient documentation

## 2010-11-25 DIAGNOSIS — E785 Hyperlipidemia, unspecified: Secondary | ICD-10-CM

## 2010-11-25 DIAGNOSIS — F329 Major depressive disorder, single episode, unspecified: Secondary | ICD-10-CM | POA: Insufficient documentation

## 2010-11-25 DIAGNOSIS — I10 Essential (primary) hypertension: Secondary | ICD-10-CM

## 2010-11-25 DIAGNOSIS — N261 Atrophy of kidney (terminal): Secondary | ICD-10-CM | POA: Insufficient documentation

## 2010-11-25 DIAGNOSIS — I2510 Atherosclerotic heart disease of native coronary artery without angina pectoris: Secondary | ICD-10-CM

## 2010-11-25 DIAGNOSIS — Q964 Mosaicism, 45, X/other cell line(s) with abnormal sex chromosome: Secondary | ICD-10-CM | POA: Insufficient documentation

## 2010-11-25 DIAGNOSIS — R5383 Other fatigue: Secondary | ICD-10-CM

## 2010-11-25 LAB — POCT GLYCOSYLATED HEMOGLOBIN (HGB A1C): Hemoglobin A1C: 6.6

## 2010-11-25 LAB — TESTOSTERONE: Testosterone: 261.77 ng/dL (ref 250–890)

## 2010-11-25 MED ORDER — ALLOPURINOL 300 MG PO TABS: 300.0000 mg | ORAL_TABLET | Freq: Every day | ORAL | Status: DC

## 2010-11-25 NOTE — Progress Notes (Signed)
  Subjective:    Patient ID: GREENE DIODATO, male    DOB: [birth year], 55 y.o.   MRN: [id number]  HPI he is here for consultation concerning recent hospitalization. He did have a catheterization done which was essentially negative. He was mainly in her because of continued difficulty with fatigue he states has been present for the last 2 months. He also notes decreased energy as well as stamina and libido. He did have a TSH done in the hospital. He also states that he has occasionally had his hemoglobin A1c checked while he's been out working. He notes a reading of 6.9. He does have a history of sleep apnea. He has had difficulty recently with smoking and is now back on Chantix.    Review of Systems     Objective:   Physical Exam alert and in no distress otherwise not examined. Hemoglobin A1c 6.6.        Assessment & Plan:  New onset diabetes. Fatigue possibly related to hypo-gonad. ASHD. Hypertension. Dyslipidemia. Cigarette abuse. I discussed the new onset of diabetes with him in regard to his overall health and well-being. Discussed diet, exercise, cigarettes. He is well aware of the diagnosis of diabetes and the ramifications of this. He does not plan to make any major diet changes and would like to become more physically active. He plans to continue to work on smoking. I will draw a testosterone. Followup pending results of this.

## 2010-11-27 NOTE — Discharge Summary (Signed)
NAME:  [name] [name], [name] [name] NO.:  [id number]  MEDICAL RECORD NO.:  [id number]           PATIENT TYPE:  O  LOCATION:  6524                         FACILITY:  [facility]  PHYSICIAN:  Landry Corporal, MDDATE OF BIRTH:  May 31, 1956  DATE OF ADMISSION:  11/18/2010 DATE OF DISCHARGE:  11/19/2010                              DISCHARGE SUMMARY   DISCHARGE DIAGNOSES: 1. Anginal symptoms, left heart catheterization reveals patent grafts. 2. History of hypertension. 3. Hyperlipidemia. 4. History of depression. 5. Tobacco abuse. 6. Peripheral vascular disease.  HOSPITAL COURSE:  [name] 55 year old Caucasian male with a history of coronary artery disease status post bypass surgery in 2005 which included an internal mammary artery to the LAD, vein graft to the diagonal, and vein graft to the OM-1 and OM-2.  The right coronary artery is nondominant.  He has complained over the last 5 months increasing fatigue and increasing dyspnea on exertion and stated that he can walk more than 75 feet without becoming winded.  He also reports claudication symptoms to the lower extremities.  He is scheduled for left heart cath on Nov 18, 2010.  This revealed patent grafts with no high-grade coronary artery disease.  He had a previous nuclear study that was false positive.  Currently, the patient is stable.  Blood pressure 114/66, pulse 68, respiratory rate 13, temperature 97.6, O2 sat 100% on CPAP device at the time of breathing, and the patient has no complaints.  He was discharged home today with follow up with Dr. Clarene Duke.  DISCHARGE LABORATORY DATA:  Sodium 137, potassium 4.3, chloride 101, carbon dioxide 27, BUN 27, creatinine 1.67, glucose 107, calcium 8.8.  STUDIES/PROCEDURES: 1. Chest x-ray on Nov 16, 2010, since prior to admission showed a     stable cardiomegaly.  Mild probable pulmonary vascular congestion. 2. Left heart catheterization on Nov 18, 2010, left main was normal     and bifurcated.  The LAD across the apex of the heart had supplied     the entire inferior septum.  There was a stent in the midportion of     the LAD that was widely patent.  There was minor irregularities     proximal.  The distal vessel in the area that was wrapped around     the apex supplied the inferior septum.  All appeared to be free of     disease.  The circumflex was only a small short piece of an ongoing     circumflex.  No OM vessels were visualized.  The right coronary     artery was a nondominant vessel, free of significant disease.     Internal mammary artery questionable to the LAD.  The internal     mammary artery was widely patent; however, it appears it inserts     into the diagonal.  I clinically did not see any inserting into the     LAD.  The saphenous vein graft was sequentially to the OM-1 and OM-     2.  The graft was large and widely patent.  The OM-1 and OM-2 were  free of significant disease.  Saphenous vein graft to the diagonal.     This graft had minor irregularities.  The diagonal was free of     significant disease but relatively small and short.  DISCHARGE MEDICATIONS: 1. Acetaminophen 325 mg 2 tablets by mouth every 4 hours as needed for     pain. 2. Allopurinol 300 mg 1 tablet by mouth daily. 3. Amlodipine 5 mg 1 tablet by mouth daily. 4. Aspirin 81 mg 1 tablet by mouth daily. 5. Atenolol 50 mg 1 tablet by mouth daily. 6. Buspirone 15 mg 1 tablet by mouth twice daily. 7. Chantix 1 mg 1 tablet by mouth twice daily. 8. Citalopram 20 mg 1 tablet by mouth daily. 9. Diovan HCT 325/12.5 mg 1 tablet by mouth daily. 10.Lovaza 1 g 2 capsules by mouth twice daily. 11.Prevacid 30 mg 1 tablet by mouth daily. 12.Simcor 1000/40 mg 1 tablet by mouth daily. 13.Trilipix 135 mg 1 tablet by mouth daily.  DISPOSITION:  [name] will be discharged home in stable condition. It was recommended he increase his activity slowly.  He may shower and bathe.  No  lifting or driving for 2 days.  It was recommended he eat a low-sodium, heart-healthy diet.  If the catheter site becomes red, painful, swollen, or discharges fluid or pus, he is to call our office. He will follow up with Dr. Clarene Duke in approximately a month.  Our office will call him with the appointment time.    ______________________________ Wilburt Finlay, PA   ______________________________ Landry Corporal, MD    BH/MEDQ  D:  11/19/2010  T:  11/20/2010  Job:  [id number]  cc:   Thereasa Solo. Little, M.D. Sharlot Gowda, M.D.  Electronically Signed by Wilburt Finlay PA on 11/22/2010 01:50:41 PM Electronically Signed by Bryan Lemma MD on 11/27/2010 12:03:05 PM

## 2010-11-30 ENCOUNTER — Telehealth: Payer: Self-pay

## 2010-11-30 NOTE — Telephone Encounter (Signed)
Pt has apt to come in the morning

## 2010-12-01 ENCOUNTER — Other Ambulatory Visit: Payer: Managed Care, Other (non HMO)

## 2010-12-01 DIAGNOSIS — R7989 Other specified abnormal findings of blood chemistry: Secondary | ICD-10-CM

## 2010-12-01 LAB — TESTOSTERONE: Testosterone: 325.65 ng/dL (ref 250–890)

## 2010-12-02 ENCOUNTER — Telehealth: Payer: Self-pay

## 2010-12-02 NOTE — Telephone Encounter (Signed)
Pt has a apt tomorrow

## 2010-12-03 ENCOUNTER — Encounter: Payer: Self-pay | Admitting: Family Medicine

## 2010-12-03 ENCOUNTER — Ambulatory Visit (INDEPENDENT_AMBULATORY_CARE_PROVIDER_SITE_OTHER): Payer: Managed Care, Other (non HMO) | Admitting: Family Medicine

## 2010-12-03 VITALS — BP 132/74 | HR 60 | Ht 68.5 in | Wt 230.0 lb

## 2010-12-03 DIAGNOSIS — E291 Testicular hypofunction: Secondary | ICD-10-CM

## 2010-12-03 MED ORDER — TESTOSTERONE 10 MG/ACT (2%) TD GEL: 4.0000 | TRANSDERMAL | Status: DC

## 2010-12-03 NOTE — Progress Notes (Signed)
  Subjective:    Patient ID: [name] [name], male    DOB: Nov 09, 1955, 55 y.o.   MRN: [id number]  HPI he is here for a consultation concerning recent testosterone testing. Both the numbers were low with one being below 300. He does have difficulty with lack of energy, stamina and low libido. He would like to be placed on medication.    Review of Systems     Objective:   Physical Exam alert and in no distress otherwise not examined        Assessment & Plan:  Hypogonadism. I will place him on Fortesta. I discussed proper dosing with him. He did have a previous PSA. He'll return here in one month for recheck.

## 2010-12-03 NOTE — Patient Instructions (Signed)
His medication daily and return here in one month for recheck.

## 2010-12-18 NOTE — Cardiovascular Report (Signed)
NAME:  [name] [name], [name] [name] NO.:  [id number]  MEDICAL RECORD NO.:  [id number]           PATIENT TYPE:  O  LOCATION:  6524                         FACILITY:  [facility]  PHYSICIAN:  Thereasa Solo. Little, M.D. DATE OF BIRTH:  [birth year]  DATE OF PROCEDURE:  11/18/2010 DATE OF DISCHARGE:                           CARDIAC CATHETERIZATION   PROCEDURE:  Cardiac catheterization.  OPERATOR:  Thereasa Solo. Little, MD  INDICATIONS FOR TEST:  This 55 year old male had bypass surgery in 2005 by Dr. Donata Clay.  He had a nuclear study performed that showed a 49% ejection fraction with mild anterior septal ischemia.  Because of this, he is brought in for outpatient cardiac catheterization.  He has a solitary kidney.  His creatinine is normally 1.5-1.7.  He was dehydrated for the last 36 hours and it is 1.8.  Because of the elevated creatinine, I did not do a ventriculogram and I tried to conserve as much contrast as possible.  PROCEDURE IN DETAIL:  The patient was prepped and draped in the usual sterile fashion exposing the right groin.  Following local anesthetic with 1% Xylocaine, the Seldinger technique was employed and a 5-French introducer sheath was placed in the right femoral artery.  Left and right coronary arteriography and graft visualization was performed. Ventriculography was not performed.  COMPLICATIONS:  None.  TOTAL CONTRAST USED:  55 mL.  EQUIPMENT:  5-French Judkins configuration catheters.  It took an RCB to cannulate the vein graft to the diagonal.  RESULTS:  Hemodynamic monitoring:  Central aortic pressure was 155/81.  Coronary arteriography: 1. Left main, normal, bifurcated. 2. LAD:  The LAD crossed the apex of the heart and supplied the entire     inferior septum.  There was stent in the midportion of the LAD that     was widely patent and there was minor irregularities proximal.  The     distal vessel and the area that wrapped around the apex and  supplied the inferior septum, all appeared to be free of disease. 3. Circumflex:  The circumflex was only a small short piece of an     ongoing circumflex.  No OM vessels were visualized. 4. Right coronary artery:  Nondominant vessel, free of significant     disease.  Grafts: 1. Internal mammary artery questionable to the LAD.  The internal     mammary artery was widely patent, however, it appears that it     inserts into a diagonal.  I clearly did not see it inserting into     the LAD. 2. Saphenous vein graft sequentially to OM-1 and OM-2.  The graft was     large and widely patent.  OM-1 and OM-2 were free of significant     disease. 3. Saphenous vein graft to the diagonal.  The graft had minor     irregularities.  The diagonal was free of significant disease but     relatively small and short.  CONCLUSION:  Widely patent grafts with no high-grade coronary artery disease.  It appears that this is a false positive nuclear study. Because of his solitary kidney and  a creatinine of 1.8, I plan to keep him overnight for aggressive hydration.  He should be ready for discharge in the morning.          ______________________________ Thereasa Solo. Little, M.D.     ABL/MEDQ  D:  11/18/2010  T:  11/19/2010  Job:  [id number]  cc:   Sharlot Gowda, M.D.  Electronically Signed by Julieanne Manson M.D. on 12/18/2010 10:59:14 AM

## 2011-01-18 ENCOUNTER — Encounter: Payer: Self-pay | Admitting: Family Medicine

## 2011-01-19 ENCOUNTER — Ambulatory Visit (INDEPENDENT_AMBULATORY_CARE_PROVIDER_SITE_OTHER): Payer: Managed Care, Other (non HMO) | Admitting: Family Medicine

## 2011-01-19 ENCOUNTER — Encounter: Payer: Self-pay | Admitting: Family Medicine

## 2011-01-19 VITALS — BP 130/80 | HR 68 | Wt 230.0 lb

## 2011-01-19 DIAGNOSIS — Z79899 Other long term (current) drug therapy: Secondary | ICD-10-CM

## 2011-01-19 DIAGNOSIS — E291 Testicular hypofunction: Secondary | ICD-10-CM

## 2011-01-19 LAB — TESTOSTERONE: Testosterone: 667.12 ng/dL (ref 250–890)

## 2011-01-19 NOTE — Progress Notes (Signed)
  Subjective:    Patient ID: [name] [name], male    DOB: 03-22-1956, 55 y.o.   MRN: [id number]  HPI He is here for recheck on his testosterone. He has noted increase in energy as well as stamina but no change in his libido   Review of Systems     Objective:   Physical Exam Alert and in no distress otherwise not examined       Assessment & Plan:  Hypogonadism Testosterone level drawn.

## 2011-01-19 NOTE — Patient Instructions (Signed)
Continue on testosterone. I will call you with results tomorrow

## 2011-01-20 ENCOUNTER — Telehealth: Payer: Self-pay

## 2011-01-20 NOTE — Telephone Encounter (Signed)
CALLED PT TO GIVE RESULTS OF LAB PT INFORMED

## 2011-03-12 ENCOUNTER — Other Ambulatory Visit: Payer: Self-pay | Admitting: Family Medicine

## 2011-05-07 ENCOUNTER — Other Ambulatory Visit: Payer: Self-pay | Admitting: Family Medicine

## 2011-05-09 NOTE — Telephone Encounter (Signed)
Is this okay to refill? 

## 2011-06-23 ENCOUNTER — Ambulatory Visit (INDEPENDENT_AMBULATORY_CARE_PROVIDER_SITE_OTHER): Payer: Managed Care, Other (non HMO) | Admitting: Family Medicine

## 2011-06-23 VITALS — BP 148/80 | HR 56 | Wt 232.0 lb

## 2011-06-23 DIAGNOSIS — E119 Type 2 diabetes mellitus without complications: Secondary | ICD-10-CM

## 2011-06-23 DIAGNOSIS — M26629 Arthralgia of temporomandibular joint, unspecified side: Secondary | ICD-10-CM

## 2011-06-23 DIAGNOSIS — N529 Male erectile dysfunction, unspecified: Secondary | ICD-10-CM

## 2011-06-23 DIAGNOSIS — E291 Testicular hypofunction: Secondary | ICD-10-CM

## 2011-06-23 DIAGNOSIS — G4730 Sleep apnea, unspecified: Secondary | ICD-10-CM

## 2011-06-23 DIAGNOSIS — Z209 Contact with and (suspected) exposure to unspecified communicable disease: Secondary | ICD-10-CM

## 2011-06-23 DIAGNOSIS — M2669 Other specified disorders of temporomandibular joint: Secondary | ICD-10-CM

## 2011-06-23 DIAGNOSIS — F329 Major depressive disorder, single episode, unspecified: Secondary | ICD-10-CM

## 2011-06-23 LAB — HEMOGLOBIN A1C
Hgb A1c MFr Bld: 6.8 % — ABNORMAL HIGH (ref <5.7)
Mean Plasma Glucose: 148 mg/dL — ABNORMAL HIGH (ref <117)

## 2011-06-23 MED ORDER — VARDENAFIL HCL 20 MG PO TABS: 20.0000 mg | ORAL_TABLET | ORAL | Status: DC | PRN

## 2011-06-23 MED ORDER — ZOLPIDEM TARTRATE 10 MG PO TABS: 10.0000 mg | ORAL_TABLET | Freq: Every evening | ORAL | Status: AC | PRN

## 2011-06-23 NOTE — Progress Notes (Signed)
  Subjective:    Patient ID: [name] [name], male    DOB: Jul 20, 1955, 55 y.o.   MRN: [id number]  HPI He is here for consult concerning right jaw pain. He recently saw his dentist. He apparently is grinding his teeth and is scheduled to have a bite plane. He is under a lot of stress due to the holidays and a heavy work schedule. He would like an anxiety medication. Review his record indicates he is already on BuSpar and Celexa. He does have underlying sleep apnea which he states he is doing well with. He has no difficulty with his sleep. He does have a history of diabetes however has not done much to address this. He is sexually active and would like to have HIV testing. He also continues on his testosterone replacement and needs to have this checked.   Review of Systems     Objective:   Physical Exam Alert and in no distress otherwise not examined      Assessment & Plan:   1. TMJ pain dysfunction syndrome    2. Sleep apnea    3. Hypogonadism male  Testosterone  4. Depression    5. Diabetes mellitus, new onset  HgB A1c  6. Contact with or exposure to unspecified communicable disease  HIV Antibody ( Reflex)  7. ED (erectile dysfunction)     Over 40 minutes spent discussing all these issues with him. I will give him Ambien to see if this will help. Told him I did not fill comfortable adding another anti-anxiety medication to his regimen. He will followup with his dentist. Followup pending blood results. Levitra was also renewed.

## 2011-06-23 NOTE — Patient Instructions (Signed)
You can make Tylenol and Aleve. 2 Tylenol 4 times a day and 2 Aleve twice a day.

## 2011-06-24 LAB — HIV ANTIBODY (ROUTINE TESTING W REFLEX): HIV: NONREACTIVE

## 2011-06-24 LAB — TESTOSTERONE: Testosterone: 883.36 ng/dL (ref 250–890)

## 2011-06-25 ENCOUNTER — Other Ambulatory Visit: Payer: Self-pay | Admitting: Family Medicine

## 2011-06-26 ENCOUNTER — Other Ambulatory Visit: Payer: Self-pay | Admitting: Family Medicine

## 2011-06-26 MED ORDER — AMOXICILLIN 875 MG PO TABS: 875.0000 mg | ORAL_TABLET | Freq: Two times a day (BID) | ORAL | Status: AC

## 2011-06-26 NOTE — Progress Notes (Signed)
Amoxil called in for treatment of a sinus infection

## 2011-07-07 ENCOUNTER — Encounter: Payer: Self-pay | Admitting: Family Medicine

## 2011-07-07 ENCOUNTER — Ambulatory Visit (INDEPENDENT_AMBULATORY_CARE_PROVIDER_SITE_OTHER): Payer: Managed Care, Other (non HMO) | Admitting: Family Medicine

## 2011-07-07 VITALS — BP 138/80 | HR 80 | Ht 68.5 in | Wt 225.0 lb

## 2011-07-07 DIAGNOSIS — E119 Type 2 diabetes mellitus without complications: Secondary | ICD-10-CM | POA: Insufficient documentation

## 2011-07-07 DIAGNOSIS — F17200 Nicotine dependence, unspecified, uncomplicated: Secondary | ICD-10-CM | POA: Insufficient documentation

## 2011-07-07 DIAGNOSIS — J069 Acute upper respiratory infection, unspecified: Secondary | ICD-10-CM

## 2011-07-07 DIAGNOSIS — B356 Tinea cruris: Secondary | ICD-10-CM

## 2011-07-07 MED ORDER — TERBINAFINE HCL 1 % EX CREA: TOPICAL_CREAM | Freq: Two times a day (BID) | CUTANEOUS | Status: AC

## 2011-07-07 NOTE — Progress Notes (Signed)
Patient presents with complaint of burning pain and rash in the groin.  There is a rash that is red, swollen and "nasty", weeping.  Has had the rash for 2-3 days.  Has been using OTC Tinactin several times a day for 2 days, without improvement.  Burns and stings to use the spray.  He recently finished amoxacillin for a sinus infection (had called Dr. Susann Givens in the evening).  Tooth pain is now improved/resolved.  Denies fevers, but head remains congested, muffled hearing.  Using Tylenol sinus with good relief.  Mucus from nose is yellow, although was bloody before the antibiotics.    Past Medical History  Diagnosis Date  . Hypertension   . Obesity   . Gout   . GERD (gastroesophageal reflux disease)   . Depression   . Sleep apnea   . ASHD (arteriosclerotic heart disease)   . Diabetes mellitus     Past Surgical History  Procedure Date  . Coronary artery bypass graft 2006  . Appendectomy 1983    History   Social History  . Marital Status: Single    Spouse Name: N/A    Number of Children: N/A  . Years of Education: N/A   Occupational History  . Psychologist, occupational (for labs)    Social History Main Topics  . Smoking status: Current Everyday Smoker -- 1.0 packs/day    Types: Cigarettes  . Smokeless tobacco: Never Used   Comment: almost ready to quit; used Chantix in the past  . 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 Drug Use: No  . Sexually Active: Not on file   Other Topics Concern  . Not on file   Social History Narrative  . No narrative on file   Current Outpatient Prescriptions on File Prior to Visit  Medication Sig Dispense Refill  . allopurinol (ZYLOPRIM) 300 MG tablet Take 1 tablet (300 mg total) by mouth daily.  30 tablet  12  . amLODipine (NORVASC) 5 MG tablet Take 5 mg by mouth daily.        Marland Kitchen aspirin 81 MG tablet Take 81 mg by mouth daily.        Marland Kitchen atenolol (TENORMIN) 50 MG tablet Take 50 mg by mouth daily.        . busPIRone (BUSPAR) 15 MG tablet  Take 15 mg by mouth 2 (two) times daily.        . Choline Fenofibrate (TRILIPIX) 135 MG capsule Take 135 mg by mouth daily.        . citalopram (CELEXA) 20 MG tablet Take 20 mg by mouth daily.        Marland Kitchen FORTESTA 10 MG/ACT (2%) GEL place 4 SQUIRTS ON SKIN once daily 1 dose OVER 24 HOURS.  60 g  5  . omega-3 acid ethyl esters (LOVAZA) 1 G capsule Take 2 g by mouth 2 (two) times daily.       Marland Kitchen SIMCOR 1000-40 MG TB24 take 1 tablet by mouth once daily  30 tablet  11  . valsartan-hydrochlorothiazide (DIOVAN-HCT) 320-12.5 MG per tablet Take 1 tablet by mouth daily.        . vardenafil (LEVITRA) 20 MG tablet Take 1 tablet (20 mg total) by mouth as needed for erectile dysfunction.  3 tablet  11  . zolpidem (AMBIEN) 10 MG tablet Take 1 tablet (10 mg total) by mouth at bedtime as needed for sleep.  15 tablet  1  . amoxicillin (AMOXIL) 875  MG tablet Take 1 tablet (875 mg total) by mouth 2 (two) times daily.  20 tablet  0  . varenicline (CHANTIX CONTINUING MONTH PAK) 1 MG tablet Take 1 mg by mouth 2 (two) times daily.         No Known Allergies  ROS:  Denies fevers, nausea, vomiting, diarrhea or other skin rashes/lesions  PHYSICAL EXAM: BP 138/80  Pulse 80  Ht 5' 8.5" (1.74 m)  Wt 225 lb (102.059 kg)  BMI 33.71 kg/m2  Bilateral groin (L>R) erythematous, weeping.  No satellite lesions or pustules.  Sharply demarcated area of redness (almost to the point of looking like an abrasion), macerated.  Sounds congested, dry cough HEENT:  PERRL, EOMI, conjunctiva clear.  TM's and EAC's normal.  OP clear.  Sinuses nontender. Nasal mucosa, mildly edematous, clear mucus Neck: no lymphadenopathy Heart: regular rate and rhythm without murmur Lungs: clear bilaterally  ASSESSMENT/PLAN:  1. Tinea cruris  terbinafine (LAMISIL) 1 % cream  2. URI (upper respiratory infection)    3. Tobacco use disorder    4. Type II or unspecified type diabetes mellitus without mention of complication, not stated as uncontrolled       Tinea cruris--either related to diabetes and/or recent antibiotics.  Not responding to OTC Tinactin. Trial of Lamisil twice daily for 2-3 weeks. If not improving, consider weekly diflucan  DM--low sugar diet, low carb diet, daily exercise and weight loss. F/u with Dr. Susann Givens within the next 2-3 months  Smoker--encouraged to quit, risks reviewed  Recent sinusitis/URI--encouraged sinus rinses.  F/u if symptoms persist/worsen. No evidence of bacterial infection on today's ENT exam.

## 2011-07-07 NOTE — Patient Instructions (Addendum)
Please quit smoking. Try sinus rinses to help with sinus pressure and drainage. It is important to get sugars down in order for fungal infection to heal--exercise daily, lose weight, avoid sweets, carbs, sugar.  Follow up if rash isn't resolving with twice daily use of Lamisil.  It is important to keep the area clean and dry.  If redness is spreading, along with warmth, fevers, etc., it may be that there is an infection, and return for re-evaluation.    Jock Itch Jock itch is a fungal infection of the skin in the groin area. It is sometimes called "ringworm" even though it is not caused by a worm. A fungus is a type of germ that thrives in dark, damp places.  CAUSES  This infection may spread from:  A fungus infection elsewhere on the body (such as athlete's foot)   Sharing towels, clothing, etc.  This infection is more common in:  Hot, humid climates.   People who wear tight-fitting clothing or wet bathing suits for long periods of time.   Athletes.   Overweight people.   People with diabetes.  SYMPTOMS  Jock itch causes the following symptoms:  Red, pink or brown rash in the groin. Rash may spread to the thighs, anus and buttocks.   Itching  DIAGNOSIS  Your caregiver may make the diagnosis by looking at the rash. Sometimes a skin scraping will be sent to test for fungus. Testing can be done either by looking under the microscope or by doing a culture (test to try to grow the fungus). A culture can take up to 2 weeks to come back. TREATMENT  Jock itch may be treated with:  Skin cream or ointment to kill fungus.   Medicine by mouth to kill fungus.   Skin cream or ointment to calm the itching.   Compresses or medicated powders to dry the infected skin.  HOME CARE INSTRUCTIONS   Be sure to treat the rash completely. Follow your caregiver's instructions. It can take a couple of weeks to treat. If you do not treat the infection long enough, the rash can come back.   Wear  loose fitting clothing.   Men should wear cotton boxer shorts   Women should wear cotton underwear.   Avoid hot baths.   Dry the groin area well after bathing.  SEEK MEDICAL CARE IF:   Your rash is worse.   Your rash is spreading.   Your rash returns after treatment is finished.   Your rash is not gone in 4 weeks. Fungal infections are slow to respond to treatment. Some redness may remain for several weeks after the fungus is gone.  SEEK IMMEDIATE MEDICAL CARE IF:  The area becomes red, warm, tender, and swollen.   You have a fever.  Document Released: 06/10/2002 Document Revised: 03/02/2011 Document Reviewed: 05/09/2008 [facility] Patient Information 2012 [place], [place].

## 2011-08-19 ENCOUNTER — Other Ambulatory Visit: Payer: Self-pay | Admitting: Family Medicine

## 2011-08-22 ENCOUNTER — Other Ambulatory Visit: Payer: Self-pay | Admitting: Family Medicine

## 2011-08-22 NOTE — Telephone Encounter (Signed)
Is this ok?

## 2011-08-26 ENCOUNTER — Other Ambulatory Visit: Payer: Self-pay | Admitting: Family Medicine

## 2011-09-09 ENCOUNTER — Other Ambulatory Visit: Payer: Self-pay | Admitting: Family Medicine

## 2011-09-09 NOTE — Telephone Encounter (Signed)
Is this ok?

## 2011-09-19 ENCOUNTER — Encounter: Payer: Self-pay | Admitting: Family Medicine

## 2011-09-19 ENCOUNTER — Ambulatory Visit (INDEPENDENT_AMBULATORY_CARE_PROVIDER_SITE_OTHER): Payer: Managed Care, Other (non HMO) | Admitting: Family Medicine

## 2011-09-19 DIAGNOSIS — G4730 Sleep apnea, unspecified: Secondary | ICD-10-CM

## 2011-09-19 DIAGNOSIS — E785 Hyperlipidemia, unspecified: Secondary | ICD-10-CM

## 2011-09-19 DIAGNOSIS — E1169 Type 2 diabetes mellitus with other specified complication: Secondary | ICD-10-CM

## 2011-09-19 DIAGNOSIS — F17200 Nicotine dependence, unspecified, uncomplicated: Secondary | ICD-10-CM

## 2011-09-19 DIAGNOSIS — M109 Gout, unspecified: Secondary | ICD-10-CM

## 2011-09-19 DIAGNOSIS — E119 Type 2 diabetes mellitus without complications: Secondary | ICD-10-CM

## 2011-09-19 DIAGNOSIS — I10 Essential (primary) hypertension: Secondary | ICD-10-CM

## 2011-09-19 LAB — POCT GLYCOSYLATED HEMOGLOBIN (HGB A1C): Hemoglobin A1C: 6.1

## 2011-09-19 MED ORDER — VALSARTAN-HYDROCHLOROTHIAZIDE 320-12.5 MG PO TABS: 1.0000 | ORAL_TABLET | Freq: Every day | ORAL | Status: DC

## 2011-09-19 MED ORDER — ALLOPURINOL 300 MG PO TABS: 300.0000 mg | ORAL_TABLET | Freq: Every day | ORAL | Status: DC

## 2011-09-19 MED ORDER — ATENOLOL 50 MG PO TABS: 50.0000 mg | ORAL_TABLET | Freq: Every day | ORAL | Status: DC

## 2011-09-19 MED ORDER — VARDENAFIL HCL 20 MG PO TABS: 20.0000 mg | ORAL_TABLET | ORAL | Status: DC | PRN

## 2011-09-19 MED ORDER — NIACIN-SIMVASTATIN ER 1000-40 MG PO TB24: 1.0000 | ORAL_TABLET | ORAL | Status: DC

## 2011-09-19 MED ORDER — AMLODIPINE BESYLATE 5 MG PO TABS: 5.0000 mg | ORAL_TABLET | Freq: Every day | ORAL | Status: DC

## 2011-09-19 MED ORDER — TESTOSTERONE 10 MG/ACT (2%) TD GEL: 4.0000 | TRANSDERMAL | Status: DC

## 2011-09-19 MED ORDER — VARENICLINE TARTRATE 1 MG PO TABS: 1.0000 mg | ORAL_TABLET | Freq: Two times a day (BID) | ORAL | Status: DC

## 2011-09-19 MED ORDER — CITALOPRAM HYDROBROMIDE 20 MG PO TABS: 20.0000 mg | ORAL_TABLET | Freq: Every day | ORAL | Status: DC

## 2011-09-19 MED ORDER — BUSPIRONE HCL 15 MG PO TABS: 15.0000 mg | ORAL_TABLET | Freq: Two times a day (BID) | ORAL | Status: DC

## 2011-09-19 MED ORDER — OMEGA-3-ACID ETHYL ESTERS 1 G PO CAPS: 1.0000 g | ORAL_CAPSULE | Freq: Every day | ORAL | Status: DC

## 2011-09-19 MED ORDER — CHOLINE FENOFIBRATE 135 MG PO CPDR: 135.0000 mg | DELAYED_RELEASE_CAPSULE | Freq: Every day | ORAL | Status: DC

## 2011-09-19 NOTE — Patient Instructions (Signed)
Keep working on diet and exercise. If your blood pressure does not improve, we will need to make some further changes. Keep working on handling stress differently so you don't start smoking again

## 2011-09-19 NOTE — Progress Notes (Signed)
  Subjective:    Patient ID: [name] [name], male    DOB: Oct 03, 1955, 56 y.o.   MRN: [id number]  HPI He is here for followup visit. He has started smoking again and would like to start back on Chantix. He recognizes that he started back due to having a stressful situation. His exercise is quite minimal. He does not check his blood sugars regularly and plans to set up an appointment with his eye doctor in the near future. He continues on medications listed in the chart. He has followup with cardiology regularly. He continues to travel a lot with work. He has had no more difficulty with gout. He does have sleep apnea.  Review of Systems     Objective:   Physical Exam And in no distress. Hemoglobin A1c is 6.1       Assessment & Plan:   1. Diabetes mellitus  POCT HgB A1C  2. Hypertension associated with diabetes    3. Hyperlipidemia LDL goal <70    4. Tobacco use disorder    5. Type II or unspecified type diabetes mellitus without mention of complication, not stated as uncontrolled    6. Sleep apnea    7. Gout     I will place him back on Chantix. Discussed the need for him to find a different way to handle stressful situations. Discussed several options with him. Also encouraged him to get more physically active. Celebrex medications were called in.

## 2011-11-04 ENCOUNTER — Telehealth: Payer: Self-pay | Admitting: Internal Medicine

## 2011-11-04 NOTE — Telephone Encounter (Signed)
Patient cant come in he is back out of town Monday at 6 am he said he would go to one of the urgent cares in the morning

## 2011-11-04 NOTE — Telephone Encounter (Signed)
Tell him that I am not comfortable calling in an antibiotic without seeing him. Have him schedule to come in the first of the week

## 2011-11-18 ENCOUNTER — Ambulatory Visit (INDEPENDENT_AMBULATORY_CARE_PROVIDER_SITE_OTHER): Payer: Managed Care, Other (non HMO) | Admitting: Family Medicine

## 2011-11-18 ENCOUNTER — Encounter: Payer: Self-pay | Admitting: Family Medicine

## 2011-11-18 VITALS — BP 140/70 | HR 80 | Temp 98.0°F | Wt 226.0 lb

## 2011-11-18 DIAGNOSIS — J209 Acute bronchitis, unspecified: Secondary | ICD-10-CM

## 2011-11-18 MED ORDER — ALBUTEROL SULFATE HFA 108 (90 BASE) MCG/ACT IN AERS: 2.0000 | INHALATION_SPRAY | Freq: Four times a day (QID) | RESPIRATORY_TRACT | Status: DC | PRN

## 2011-11-18 NOTE — Progress Notes (Signed)
  Subjective:    Patient ID: [name] [name], male    DOB: [birth year], 56 y.o.   MRN: [id number]  HPI In early May he developed URI symptoms of chest congestion coughing and wheezing. On May 6 he was seen in an urgent care Center and given a Z-Pak as well as a steroid injection and told to take Mucinex. He did get slightly better with this however over the next 9 days his symptoms recurred with increasing shortness of breath, malaise, coughing. He did check his pulse ox which was 92. He was seen in another urgent care Center. Chest x-ray did show cardiomegaly as well as questionable pneumonia however further evaluation by radiology stated this was not pneumonia. He was given a steroid injection as well as a Dosepak and placed on Cefdinir 300 mg twice a day. He is here for reevaluation.  Review of Systems     Objective:   Physical Exam Alert and in no distress with normal respirations. Lungs show scattered rhonchi. Cardiac exam shows a regular rhythm without murmurs or gallops.      Assessment & Plan:   1. Bronchitis, acute  albuterol (PROVENTIL HFA;VENTOLIN HFA) 108 (90 BASE) MCG/ACT inhaler   he is to continue on his present medication regimen and touch bases with me on Monday.

## 2011-11-29 ENCOUNTER — Ambulatory Visit
Admission: RE | Admit: 2011-11-29 | Discharge: 2011-11-29 | Disposition: A | Discharge status: Home / Self Care | Payer: Managed Care, Other (non HMO) | Source: Ambulatory Visit | Attending: Family Medicine | Admitting: Family Medicine

## 2011-11-29 ENCOUNTER — Ambulatory Visit (INDEPENDENT_AMBULATORY_CARE_PROVIDER_SITE_OTHER): Payer: Managed Care, Other (non HMO) | Admitting: Family Medicine

## 2011-11-29 VITALS — BP 130/90 | HR 98 | Temp 98.1°F | Wt 225.0 lb

## 2011-11-29 DIAGNOSIS — J209 Acute bronchitis, unspecified: Secondary | ICD-10-CM

## 2011-11-29 LAB — CBC WITH DIFFERENTIAL/PLATELET
Basophils Absolute: 0 10*3/uL (ref 0.0–0.1)
Basophils Relative: 0 % (ref 0–1)
HCT: 43.8 % (ref 39.0–52.0)
Hemoglobin: 14.8 g/dL (ref 13.0–17.0)
Lymphocytes Relative: 31 % (ref 12–46)
MCHC: 33.8 g/dL (ref 30.0–36.0)
Monocytes Relative: 6 % (ref 3–12)
Neutro Abs: 10.3 10*3/uL — ABNORMAL HIGH (ref 1.7–7.7)
Neutrophils Relative %: 63 % (ref 43–77)
WBC: 16.3 10*3/uL — ABNORMAL HIGH (ref 4.0–10.5)

## 2011-11-29 LAB — COMPREHENSIVE METABOLIC PANEL
AST: 19 U/L (ref 0–37)
Albumin: 3.6 g/dL (ref 3.5–5.2)
Alkaline Phosphatase: 42 U/L (ref 39–117)
Calcium: 8.8 mg/dL (ref 8.4–10.5)
Chloride: 107 mEq/L (ref 96–112)
Glucose, Bld: 114 mg/dL — ABNORMAL HIGH (ref 70–99)
Potassium: 4.2 mEq/L (ref 3.5–5.3)
Sodium: 137 mEq/L (ref 135–145)
Total Protein: 6.3 g/dL (ref 6.0–8.3)

## 2011-11-29 NOTE — Progress Notes (Signed)
  Subjective:    Patient ID: [name] [name], male    DOB: 12/01/1955, 56 y.o.   MRN: [id number]  HPI He was seen approximately one week ago. Proventil inhaler was given and he was to stay on his antibiotic. He did get somewhat better however all the symptoms as per previous encounter note have reoccurred. He is here for recheck.   Review of Systems     Objective:   Physical Exam alert and in no distress. Tympanic membranes and canals are normal. Throat is clear. Tonsils are normal. Neck is supple without adenopathy or thyromegaly. Cardiac exam shows an irregular sinus rhythm without murmurs or gallops. Lungs are clear to auscultation. Chest x-ray shows cardiomegaly but no other major changes.        Assessment & Plan:   1. Bronchitis, acute  CBC with Differential, Comprehensive metabolic panel, DG Chest 2 View   I will await the results of the blood work and treat either with Levaquin or Augmentin unless the blood work indicates otherwise

## 2011-11-30 ENCOUNTER — Other Ambulatory Visit: Payer: Self-pay

## 2011-11-30 MED ORDER — AMOXICILLIN-POT CLAVULANATE 875-125 MG PO TABS: 1.0000 | ORAL_TABLET | Freq: Two times a day (BID) | ORAL | Status: AC

## 2011-11-30 NOTE — Telephone Encounter (Signed)
Sent antibiotic in per [employer]

## 2012-02-09 ENCOUNTER — Encounter: Payer: Self-pay | Admitting: Family Medicine

## 2012-02-09 ENCOUNTER — Ambulatory Visit (INDEPENDENT_AMBULATORY_CARE_PROVIDER_SITE_OTHER): Payer: Managed Care, Other (non HMO) | Admitting: Family Medicine

## 2012-02-09 VITALS — BP 124/80 | Wt 224.0 lb

## 2012-02-09 DIAGNOSIS — E119 Type 2 diabetes mellitus without complications: Secondary | ICD-10-CM

## 2012-02-09 DIAGNOSIS — J209 Acute bronchitis, unspecified: Secondary | ICD-10-CM

## 2012-02-09 DIAGNOSIS — F17200 Nicotine dependence, unspecified, uncomplicated: Secondary | ICD-10-CM

## 2012-02-09 MED ORDER — AMOXICILLIN 875 MG PO TABS: 875.0000 mg | ORAL_TABLET | Freq: Two times a day (BID) | ORAL | Status: AC

## 2012-02-09 NOTE — Progress Notes (Signed)
  Subjective:    Patient ID: [name] [name], male    DOB: Jun 30, 1956, 56 y.o.   MRN: [id number]  HPI Slowly 5 days ago he developed a dry cough followed by malaise, bad taste in his mouth, continued PND. No fever, chills, sore throat, earache and no productive cough he is getting ready to travel for the next month. These symptoms are very similar to previous symptoms that ended up being bronchitis requiring an antibiotic. He does continue to smoke and at this point is not interested in quitting  Review of Systems     Objective:   Physical Exam alert and in no distress. Tympanic membranes and canals are normal. Throat is clear. Tonsils are normal. Neck is supple without adenopathy or thyromegaly. Cardiac exam shows a regular sinus rhythm without murmurs or gallops. Lungs are clear to auscultation.        Assessment & Plan:   1. Acute bronchitis  amoxicillin (AMOXIL) 875 MG tablet  2. Diabetes mellitus, new onset    3. Tobacco use disorder

## 2012-03-21 ENCOUNTER — Telehealth: Payer: Self-pay | Admitting: Family Medicine

## 2012-03-21 ENCOUNTER — Encounter: Payer: Self-pay | Admitting: Family Medicine

## 2012-03-21 ENCOUNTER — Ambulatory Visit (INDEPENDENT_AMBULATORY_CARE_PROVIDER_SITE_OTHER): Payer: Managed Care, Other (non HMO) | Admitting: Family Medicine

## 2012-03-21 ENCOUNTER — Ambulatory Visit
Admission: RE | Admit: 2012-03-21 | Discharge: 2012-03-21 | Disposition: A | Discharge status: Home / Self Care | Payer: Managed Care, Other (non HMO) | Source: Ambulatory Visit | Attending: Family Medicine | Admitting: Family Medicine

## 2012-03-21 VITALS — BP 132/88 | HR 76 | Temp 97.9°F | Ht 66.5 in | Wt 219.0 lb

## 2012-03-21 DIAGNOSIS — R05 Cough: Secondary | ICD-10-CM

## 2012-03-21 DIAGNOSIS — J209 Acute bronchitis, unspecified: Secondary | ICD-10-CM

## 2012-03-21 DIAGNOSIS — F17200 Nicotine dependence, unspecified, uncomplicated: Secondary | ICD-10-CM

## 2012-03-21 MED ORDER — PREDNISONE 20 MG PO TABS: 20.0000 mg | ORAL_TABLET | Freq: Two times a day (BID) | ORAL | Status: DC

## 2012-03-21 MED ORDER — VARENICLINE TARTRATE 1 MG PO TABS: 1.0000 mg | ORAL_TABLET | Freq: Two times a day (BID) | ORAL | Status: DC

## 2012-03-21 MED ORDER — VARENICLINE TARTRATE 0.5 MG X 11 & 1 MG X 42 PO MISC: ORAL | Status: DC

## 2012-03-21 MED ORDER — AZITHROMYCIN 250 MG PO TABS: ORAL_TABLET | ORAL | Status: DC

## 2012-03-21 NOTE — Patient Instructions (Addendum)
Go to get your chest x-ray today.  We will contact you later this afternoon with your results and treatment recommendations.  Please quit smoking--we re-prescribed the Chantix.  I think you'll do better starting with another starter kit--lower dose, less nausea.  Plan to follow up with Dr. Susann Givens next week if you aren't 100% better, or as directed based on your x-ray results.

## 2012-03-21 NOTE — Telephone Encounter (Signed)
Can potentially prolong QT--we discussed cutting citalopram to 1/2 dose while on meds.  Was this okay? Done/handled?

## 2012-03-21 NOTE — Progress Notes (Signed)
Chief Complaint  Patient presents with  . Cough    has had cough and congestion since Sept 1,2013. Also notes that he has had fever, inner ear problems also.   HPI:  He got sick 9/1 after getting his car cleaned and sprayed with "new car spray".  Had fevers, congestion, vertigo.  Has never completely resolved, having ongoing congestion and cough. Periodically gets lowgrade fevers in the evenings.  Vertigo has improved.  Ongoing cough and shortness of breath.  He has tried using albuterol inhaler but it didn't seem to help at all.  Phlegm is clear in color.  Initially nasal mucus was green, but is now clear and pale yellow, sometimes bloody.  Occasional sinus headaches, inconsistent.  He was taking Mucinex DM but it seems to "aggravate" the cough.  Also using [facility] antihistamines.  Hasn't done sinus rinses due to traveling.  Smoker--Previously tried Chantix twice.  First time seemed to work well, took it for 3 months, stayed off cigarettes for about 6 months.  Restarted during stressful time when around a lot of smokers.  Restarted Chantix--but used continuation pak, started at only once daily dose, but still caused too much nausea to tolerate.  Past Medical History  Diagnosis Date  . Hypertension   . Obesity   . Gout   . GERD (gastroesophageal reflux disease)   . Depression   . Sleep apnea   . ASHD (arteriosclerotic heart disease)   . Diabetes mellitus    Past Surgical History  Procedure Date  . Coronary artery bypass graft 2006  . Appendectomy 1983   History   Social History  . Marital Status: Single    Spouse Name: N/A    Number of Children: N/A  . Years of Education: N/A   Occupational History  . Psychologist, occupational (for labs)    Social History Main Topics  . Smoking status: Current Every Day Smoker -- 1.0 packs/day    Types: Cigarettes  . Smokeless tobacco: Never Used   Comment: almost ready to quit; used Chantix in the past  . 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 Drug Use: No  . Sexually Active: Yes   Other Topics Concern  . Not on file   Social History Narrative  . No narrative on file    Current Outpatient Prescriptions on File Prior to Visit  Medication Sig Dispense Refill  . allopurinol (ZYLOPRIM) 300 MG tablet Take 1 tablet (300 mg total) by mouth daily.  30 tablet  12  . amLODipine (NORVASC) 5 MG tablet Take 1 tablet (5 mg total) by mouth daily.  30 tablet  11  . aspirin 81 MG tablet Take 81 mg by mouth daily.        Marland Kitchen atenolol (TENORMIN) 50 MG tablet Take 1 tablet (50 mg total) by mouth daily.  30 tablet  11  . busPIRone (BUSPAR) 15 MG tablet Take 1 tablet (15 mg total) by mouth 2 (two) times daily at 10 AM and 5 PM.  60 tablet  5  . Choline Fenofibrate (TRILIPIX) 135 MG capsule Take 1 capsule (135 mg total) by mouth daily.  30 capsule  11  . citalopram (CELEXA) 20 MG tablet Take 1 tablet (20 mg total) by mouth daily.  30 tablet  11  . Niacin-Simvastatin [facility]) 1000-40 MG TB24 Take 1 capsule by mouth 1 day or 1 dose.  30 tablet  11  . Testosterone (FORTESTA) 10 MG/ACT (2%) GEL  Apply 4 Squirts topically 1 day or 1 dose.  60 g  5  . valsartan-hydrochlorothiazide (DIOVAN-HCT) 320-12.5 MG per tablet Take 1 tablet by mouth daily.  30 tablet  11  . vardenafil (LEVITRA) 20 MG tablet Take 1 tablet (20 mg total) by mouth as needed for erectile dysfunction.  3 tablet  11  . DISCONTD: omega-3 acid ethyl esters (LOVAZA) 1 G capsule Take 1 capsule (1 g total) by mouth daily.  120 capsule  11  . albuterol (PROVENTIL HFA;VENTOLIN HFA) 108 (90 BASE) MCG/ACT inhaler Inhale 2 puffs into the lungs every 6 (six) hours as needed for wheezing.  1 Inhaler  0  . terbinafine (LAMISIL) 1 % cream Apply topically 2 (two) times daily. Use until completely resolved (up to 2-4 weeks)  30 g  0  . varenicline (CHANTIX CONTINUING MONTH PAK) 1 MG tablet Take 1 tablet (1 mg total) by mouth 2 (two) times daily.  60 tablet  1   No Known Allergies  ROS: Denies  chest pain, nausea, vomiting, diarrhea, skin rash, edema. Dizziness/vertigo improved. +cough, shortness of breath. No abdominal pain or other concerns.   PHYSICAL EXAM: BP 132/88  Pulse 76  Temp 97.9 F (36.6 C) (Oral)  Ht 5' 6.5" (1.689 m)  Wt 219 lb (99.338 kg)  BMI 34.82 kg/m2 Well developed, pleasant male in no distress.  Speaking easily in full sentences. HEENT:  PERRL, EOMI, conjunctiva clear.  TM's and EAC's normal.  OP clear. Nasal mucosa mildly edematous, no purulence, no significant erythema.  Sinuses nontender. Neck: no lymphadenopathy Heart: regular rhythm with frequent ectopic/extra beats Lungs:  Coarse breath sounds, especially anteriorly.  Some crackles noted at right base.  No wheezing Skin: no rash Psych: normal mood, affect, hygiene and grooming.   ASSESSMENT/PLAN: 1. Bronchitis, acute  azithromycin (ZITHROMAX) 250 MG tablet, predniSONE (DELTASONE) 20 MG tablet  2. Tobacco use disorder  varenicline (CHANTIX CONTINUING MONTH PAK) 1 MG tablet, varenicline (CHANTIX STARTING MONTH PAK) 0.5 MG X 11 & 1 MG X 42 tablet  3. Cough  DG Chest 2 View    Cough--r/o pneumonia given crackles at R base.   Await CXR results to determine antibiotic choice (levaquin if +pna). Also plan on short course of steroids (20mg  BID x 5 days)  Addendum: CXR--no infiltrate.  Treat with z-pak and prednisone (chart reviewed, previously rx'd amoxacillin and Augmentin through this office)  Tobacco abuse--willing to retry Chantix with another starter kit (might not have tolerated second try at it due to starting at a higher dose).   Risks and side effects reviewed.

## 2012-03-21 NOTE — Telephone Encounter (Signed)
I did speak with the pharmacist, Almira Coaster and gave her these instructions. The rx was filled and patient is aware of what to do.

## 2012-03-22 ENCOUNTER — Other Ambulatory Visit: Payer: Self-pay | Admitting: Family Medicine

## 2012-03-22 NOTE — Telephone Encounter (Signed)
Renew this 

## 2012-03-22 NOTE — Telephone Encounter (Signed)
Med called in per jcl 

## 2012-03-22 NOTE — Telephone Encounter (Signed)
Is this ok?

## 2012-04-03 HISTORY — PX: OTHER SURGICAL HISTORY: SHX169

## 2012-05-28 ENCOUNTER — Encounter: Payer: Self-pay | Admitting: Internal Medicine

## 2012-06-25 ENCOUNTER — Telehealth: Payer: Self-pay | Admitting: Internal Medicine

## 2012-06-25 MED ORDER — OMEGA-3-ACID ETHYL ESTERS 1 G PO CAPS: 4.0000 g | ORAL_CAPSULE | Freq: Every day | ORAL | Status: DC

## 2012-06-25 NOTE — Telephone Encounter (Signed)
[facility] said p t has previovsly been taking 4 caps QD been on file, can we please get a new rx with correct sig. of Lovaza 1G.

## 2012-06-26 ENCOUNTER — Other Ambulatory Visit: Payer: Self-pay

## 2012-06-30 ENCOUNTER — Other Ambulatory Visit: Payer: Self-pay | Admitting: Family Medicine

## 2012-07-30 ENCOUNTER — Ambulatory Visit (INDEPENDENT_AMBULATORY_CARE_PROVIDER_SITE_OTHER): Payer: Managed Care, Other (non HMO) | Admitting: Family Medicine

## 2012-07-30 ENCOUNTER — Encounter: Payer: Self-pay | Admitting: Family Medicine

## 2012-07-30 VITALS — BP 118/80 | HR 76 | Wt 230.0 lb

## 2012-07-30 DIAGNOSIS — E1159 Type 2 diabetes mellitus with other circulatory complications: Secondary | ICD-10-CM

## 2012-07-30 DIAGNOSIS — F17200 Nicotine dependence, unspecified, uncomplicated: Secondary | ICD-10-CM

## 2012-07-30 DIAGNOSIS — E119 Type 2 diabetes mellitus without complications: Secondary | ICD-10-CM

## 2012-07-30 DIAGNOSIS — I10 Essential (primary) hypertension: Secondary | ICD-10-CM

## 2012-07-30 DIAGNOSIS — E1169 Type 2 diabetes mellitus with other specified complication: Secondary | ICD-10-CM

## 2012-07-30 DIAGNOSIS — Z209 Contact with and (suspected) exposure to unspecified communicable disease: Secondary | ICD-10-CM

## 2012-07-30 DIAGNOSIS — E785 Hyperlipidemia, unspecified: Secondary | ICD-10-CM

## 2012-07-30 LAB — POCT GLYCOSYLATED HEMOGLOBIN (HGB A1C): Hemoglobin A1C: 6.5

## 2012-07-30 MED ORDER — VARENICLINE TARTRATE 1 MG PO TABS: 1.0000 mg | ORAL_TABLET | Freq: Two times a day (BID) | ORAL | Status: DC

## 2012-07-30 NOTE — Progress Notes (Signed)
  Subjective:    Patient ID: [name] [name], male    DOB: 08/30/1955, 57 y.o.   MRN: [id number]  HPI Is here for followup visit. He does occasionally check his blood sugars but does not exercise regularly. He continues on medications listed in the chart. He continues to work on smoking cessation and has been cigarette free since January. He would like to continue on his Chantix. He has tried quitting in the past and knows that he needs to stay on the Chantix for least 6 months. He has been sexually active and would like to be HIV tested again. He travels a lot with work. He is in a long-term relationship but is thinking about getting out of this.   Review of Systems     Objective:   Physical Exam Alert and in no distress. Hemoglobin A1c is 6.5.       Assessment & Plan:   1. Type II or unspecified type diabetes mellitus without mention of complication, not stated as uncontrolled  POCT glycosylated hemoglobin (Hb A1C)  2. Tobacco use disorder  varenicline (CHANTIX CONTINUING MONTH PAK) 1 MG tablet  3. Contact with or exposure to unspecified communicable disease  HIV Antibody  4. Diabetes mellitus, new onset    5. Hypertension associated with diabetes    6. Hyperlipidemia LDL goal <70     discussed smoking cessation with him. Will continue him on Chantix. HIV testing will be done. I again encouraged him to become more physically active. He did say that he is at least contemplating doing this. Over 20 minutes spent discussing all these issues with him.

## 2012-07-31 NOTE — Progress Notes (Signed)
Quick Note:  CALLED PT TO INFORM HIM OF HIS LABS PT VERBALIZED UNDERSTANDING ______

## 2012-07-31 NOTE — Progress Notes (Signed)
Quick Note:  The blood work is normal ______ 

## 2012-08-23 ENCOUNTER — Telehealth: Payer: Self-pay | Admitting: Family Medicine

## 2012-08-24 ENCOUNTER — Telehealth: Payer: Self-pay | Admitting: Family Medicine

## 2012-08-28 ENCOUNTER — Telehealth: Payer: Self-pay | Admitting: Family Medicine

## 2012-08-28 NOTE — Telephone Encounter (Signed)
Switch to Testim one tube per day and recheck in one month

## 2012-08-28 NOTE — Telephone Encounter (Signed)
JCL DO YOU WANT TO SWITCH TO ANDROGEL OR TESTIM

## 2012-08-29 MED ORDER — TESTOSTERONE 50 MG/5GM (1%) TD GEL: 5.0000 g | Freq: Every day | TRANSDERMAL | Status: DC

## 2012-08-29 NOTE — Telephone Encounter (Signed)
LM

## 2012-08-30 NOTE — Telephone Encounter (Signed)
LM

## 2012-09-14 ENCOUNTER — Other Ambulatory Visit: Payer: Self-pay | Admitting: Family Medicine

## 2012-09-15 ENCOUNTER — Other Ambulatory Visit: Payer: Self-pay | Admitting: Family Medicine

## 2012-09-22 ENCOUNTER — Other Ambulatory Visit: Payer: Self-pay | Admitting: Family Medicine

## 2012-10-09 ENCOUNTER — Encounter: Payer: Self-pay | Admitting: Family Medicine

## 2012-10-09 ENCOUNTER — Ambulatory Visit (INDEPENDENT_AMBULATORY_CARE_PROVIDER_SITE_OTHER): Payer: Managed Care, Other (non HMO) | Admitting: Family Medicine

## 2012-10-09 VITALS — BP 120/80 | HR 115 | Wt 237.0 lb

## 2012-10-09 DIAGNOSIS — I499 Cardiac arrhythmia, unspecified: Secondary | ICD-10-CM

## 2012-10-09 DIAGNOSIS — I2510 Atherosclerotic heart disease of native coronary artery without angina pectoris: Secondary | ICD-10-CM

## 2012-10-09 DIAGNOSIS — I447 Left bundle-branch block, unspecified: Secondary | ICD-10-CM

## 2012-10-09 DIAGNOSIS — I4891 Unspecified atrial fibrillation: Secondary | ICD-10-CM

## 2012-10-09 DIAGNOSIS — E119 Type 2 diabetes mellitus without complications: Secondary | ICD-10-CM

## 2012-10-09 LAB — CBC WITH DIFFERENTIAL/PLATELET
Eosinophils Relative: 0 % (ref 0–5)
HCT: 43.6 % (ref 39.0–52.0)
Lymphocytes Relative: 31 % (ref 12–46)
Lymphs Abs: 2.9 10*3/uL (ref 0.7–4.0)
MCV: 93.8 fL (ref 78.0–100.0)
Monocytes Absolute: 0.9 10*3/uL (ref 0.1–1.0)
Monocytes Relative: 10 % (ref 3–12)
RBC: 4.65 MIL/uL (ref 4.22–5.81)
WBC: 9.5 10*3/uL (ref 4.0–10.5)

## 2012-10-09 LAB — COMPREHENSIVE METABOLIC PANEL
BUN: 28 mg/dL — ABNORMAL HIGH (ref 6–23)
CO2: 28 mEq/L (ref 19–32)
Calcium: 9 mg/dL (ref 8.4–10.5)
Chloride: 101 mEq/L (ref 96–112)
Creat: 1.78 mg/dL — ABNORMAL HIGH (ref 0.50–1.35)

## 2012-10-09 LAB — LIPID PANEL
Cholesterol: 186 mg/dL (ref 0–200)
LDL Cholesterol: 89 mg/dL (ref 0–99)
Triglycerides: 306 mg/dL — ABNORMAL HIGH (ref <150)

## 2012-10-09 LAB — TSH: TSH: 6.558 u[IU]/mL — ABNORMAL HIGH (ref 0.350–4.500)

## 2012-10-09 NOTE — Progress Notes (Signed)
  Subjective:    Patient ID: [name] [name], male    DOB: 05-Nov-1955, 57 y.o.   MRN: [id number]  HPI He quit smoking January 1 and since then has noted more difficulty with shortness of breath especially with activities. He also notes when he lies down at night he will get short of breath. No chest pain, diaphoresis,  weakness,PND. He was last seen by cardiology 2 years ago. He continues on other medications listed in the chart.   Review of Systems     Objective:   Physical Exam Alert and in no distress. A regular rate noted. Lungs are clear to auscultation. EKG shows atrial fib with LBBB.       Assessment & Plan:  Arrhythmia - Plan: EKG 12-Lead  Atrial fibrillation - Plan: Lipid panel, CBC with Differential, Comprehensive metabolic panel, TSH  LBBB (left bundle branch block)  ASHD (arteriosclerotic heart disease)  Type II or unspecified type diabetes mellitus without mention of complication, not stated as uncontrolled  He will be referred to cardiology for further evaluation and treatment.

## 2012-10-12 ENCOUNTER — Other Ambulatory Visit: Payer: Self-pay | Admitting: *Deleted

## 2012-10-15 ENCOUNTER — Encounter: Payer: Self-pay | Admitting: Family Medicine

## 2012-10-15 ENCOUNTER — Ambulatory Visit (INDEPENDENT_AMBULATORY_CARE_PROVIDER_SITE_OTHER): Payer: Managed Care, Other (non HMO) | Admitting: Family Medicine

## 2012-10-15 VITALS — BP 116/60 | HR 70 | Temp 97.6°F | Wt 232.0 lb

## 2012-10-15 DIAGNOSIS — I4891 Unspecified atrial fibrillation: Secondary | ICD-10-CM

## 2012-10-15 DIAGNOSIS — J069 Acute upper respiratory infection, unspecified: Secondary | ICD-10-CM

## 2012-10-15 DIAGNOSIS — I4819 Other persistent atrial fibrillation: Secondary | ICD-10-CM | POA: Insufficient documentation

## 2012-10-15 MED ORDER — LEVALBUTEROL TARTRATE 45 MCG/ACT IN AERO: 1.0000 | INHALATION_SPRAY | RESPIRATORY_TRACT | Status: AC | PRN

## 2012-10-15 NOTE — Patient Instructions (Signed)
You can use Afrin nasal spray at night to help with breathing. NyQuil is okay but no decongestants. If you're not starting to get better by Wednesday give me a call.

## 2012-10-15 NOTE — Progress Notes (Signed)
  Subjective:    Patient ID: [name] [name], male    DOB: [birth year], 57 y.o.   MRN: [id number]  HPI Since last being seen, he was seen in cardiology. He was placed  on Xarelto and he had difficulty with bleeding gums and stopped taking it as of today. He has a call into cardiology. He also is here for evaluation of a five-day history of sore throat, nasal congestion, dry cough, fever and chills. He did stop smoking in January.   Review of Systems     Objective:   Physical Exam alert and in no distress. Tympanic membranes and canals are normal. Throat is clear. Tonsils are normal. Neck is supple without adenopathy or thyromegaly. Cardiac exam shows an irregular  rhythm without murmurs or gallops. Lungs show scattered rhonchi        Assessment & Plan:  Atrial fibrillation  URI, acute - Plan: levalbuterol (XOPENEX HFA) 45 MCG/ACT inhaler instructed to use Afrin at night tends to avoid decongestants.

## 2012-10-16 ENCOUNTER — Other Ambulatory Visit: Payer: Self-pay | Admitting: *Deleted

## 2012-10-16 NOTE — Addendum Note (Signed)
Addended by: [name] on: 10/16/2012 12:59 PM   Modules accepted: Orders

## 2012-10-17 ENCOUNTER — Encounter (HOSPITAL_COMMUNITY): Payer: Self-pay

## 2012-10-17 ENCOUNTER — Encounter (HOSPITAL_COMMUNITY)
Admission: RE | Disposition: A | Discharge status: Home / Self Care | Payer: Self-pay | Source: Ambulatory Visit | Attending: Internal Medicine

## 2012-10-17 ENCOUNTER — Ambulatory Visit (HOSPITAL_COMMUNITY)
Admission: RE | Admit: 2012-10-17 | Discharge: 2012-10-17 | Disposition: A | Discharge status: Home / Self Care | Payer: Managed Care, Other (non HMO) | Source: Ambulatory Visit | Attending: Internal Medicine | Admitting: Internal Medicine

## 2012-10-17 ENCOUNTER — Encounter (HOSPITAL_COMMUNITY): Payer: Self-pay | Admitting: Anesthesiology

## 2012-10-17 ENCOUNTER — Telehealth: Payer: Self-pay | Admitting: Family Medicine

## 2012-10-17 DIAGNOSIS — Z7901 Long term (current) use of anticoagulants: Secondary | ICD-10-CM | POA: Insufficient documentation

## 2012-10-17 DIAGNOSIS — Z538 Procedure and treatment not carried out for other reasons: Secondary | ICD-10-CM | POA: Insufficient documentation

## 2012-10-17 DIAGNOSIS — G4730 Sleep apnea, unspecified: Secondary | ICD-10-CM | POA: Insufficient documentation

## 2012-10-17 DIAGNOSIS — I4891 Unspecified atrial fibrillation: Secondary | ICD-10-CM | POA: Insufficient documentation

## 2012-10-17 LAB — BASIC METABOLIC PANEL
BUN: 41 mg/dL — ABNORMAL HIGH (ref 6–23)
CO2: 24 mEq/L (ref 19–32)
GFR calc non Af Amer: 30 mL/min — ABNORMAL LOW (ref >90)
Glucose, Bld: 135 mg/dL — ABNORMAL HIGH (ref 70–99)
Potassium: 3.1 mEq/L — ABNORMAL LOW (ref 3.5–5.1)
Sodium: 138 mEq/L (ref 135–145)

## 2012-10-17 SURGERY — CANCELLED PROCEDURE

## 2012-10-17 MED ORDER — AMOXICILLIN-POT CLAVULANATE 875-125 MG PO TABS: 1.0000 | ORAL_TABLET | Freq: Two times a day (BID) | ORAL | Status: DC

## 2012-10-17 MED ORDER — SODIUM CHLORIDE 0.9 % IV SOLN
INTRAVENOUS | Status: DC
  Administered 2012-10-17: 500 mL via INTRAVENOUS

## 2012-10-17 NOTE — Anesthesia Preprocedure Evaluation (Addendum)
Anesthesia Evaluation  Patient identified by MRN, date of birth, ID band Patient awake    Reviewed: Allergy & Precautions, H&P , NPO status , Patient's Chart, lab work & pertinent test results  History of Anesthesia Complications Negative for: history of anesthetic complications  Airway       Dental   Pulmonary sleep apnea , Current Smoker,          Cardiovascular hypertension, Pt. on medications + CAD and + CABG + dysrhythmias Atrial Fibrillation     Neuro/Psych PSYCHIATRIC DISORDERS Depression    GI/Hepatic GERD-  ,  Endo/Other  diabetesMorbid obesity  Renal/GU Renal InsufficiencyRenal disease     Musculoskeletal   Abdominal   Peds  Hematology negative hematology ROS (+)   Anesthesia Other Findings   Reproductive/Obstetrics                          Anesthesia Physical Anesthesia Plan  ASA: III  Anesthesia Plan: General   Post-op Pain Management:    Induction: Intravenous  Airway Management Planned: Mask  Additional Equipment:   Intra-op Plan:   Post-operative Plan:   Informed Consent:   Plan Discussed with: CRNA, Anesthesiologist and Surgeon  Anesthesia Plan Comments:         Anesthesia Quick Evaluation

## 2012-10-17 NOTE — Progress Notes (Signed)
Mr. Levene presented today for TEE/Cardioversion today after being seen in the office by Wilburt Finlay, PA-C. He has been on xarelto for 1 week, but stopped the medication Sunday due to gingival bleeding and continues to have significant bleeding. He reports that he had not had such bleeding on warfarin in the past. In addition, Mr. Okerlund is morbidly obese with a short, wide neck and history of sleep apnea - he is at high risk for airway complication with moderate sedation. Since his anticoagulation was interrupted, I would not recommend cardioversion today. We will need to switch him to an alternative anticoagulant, probably warfarin.  It would be safest to anticoagulate him for 1 month and then proceed with DCCV (without TEE).  If he prefers earlier cardioversion, TEE in the OR with general anesthesia would be recommended.  We will arrange follow-up with him in the office next week to change his anticoagulation.  [name], MD, [facility] Attending Cardiologist [facility]

## 2012-10-17 NOTE — Telephone Encounter (Signed)
Pt stated you wanted him to call you and tell you if he was better, he is not better, cough still there and head feels like it is in a vise.  [facility] [address]

## 2012-10-17 NOTE — H&P (Signed)
     [facility]          INTERVAL PROCEDURE H&P   History and Physical Interval Note:  10/17/2012 11:38 AM  [name] [name] has presented today for their planned procedure. The various methods of treatment have been discussed with the patient and family. After consideration of risks, benefits and other options for treatment, the patient has consented to the procedure.  The patients' outpatient history has been reviewed, patient examined, and no change in status from most recent office note within the past 30 days. I have reviewed the patients' chart and labs and will proceed as planned. Questions were answered to the patient's satisfaction.   [name] Nose, MD, [facility] Attending Cardiologist [facility]  [name] [name] C 10/17/2012, 11:38 AM

## 2012-10-17 NOTE — Telephone Encounter (Signed)
Let him know that he call the medication in.

## 2012-10-17 NOTE — Telephone Encounter (Signed)
Pt.notified

## 2012-10-30 ENCOUNTER — Other Ambulatory Visit: Payer: Self-pay | Admitting: *Deleted

## 2012-10-31 NOTE — Addendum Note (Signed)
Addended by: [name] on: 10/31/2012 12:10 PM   Modules accepted: Orders

## 2012-11-14 ENCOUNTER — Ambulatory Visit (HOSPITAL_COMMUNITY)
Admission: RE | Admit: 2012-11-14 | Discharge: 2012-11-14 | Disposition: A | Discharge status: Home / Self Care | Payer: Managed Care, Other (non HMO) | Source: Ambulatory Visit | Attending: Internal Medicine | Admitting: Internal Medicine

## 2012-11-14 ENCOUNTER — Ambulatory Visit (HOSPITAL_COMMUNITY): Payer: Managed Care, Other (non HMO) | Admitting: Anesthesiology

## 2012-11-14 ENCOUNTER — Encounter (HOSPITAL_COMMUNITY): Payer: Self-pay | Admitting: Anesthesiology

## 2012-11-14 ENCOUNTER — Encounter (HOSPITAL_COMMUNITY)
Admission: RE | Disposition: A | Discharge status: Home / Self Care | Payer: Self-pay | Source: Ambulatory Visit | Attending: Internal Medicine

## 2012-11-14 DIAGNOSIS — F329 Major depressive disorder, single episode, unspecified: Secondary | ICD-10-CM | POA: Insufficient documentation

## 2012-11-14 DIAGNOSIS — I2510 Atherosclerotic heart disease of native coronary artery without angina pectoris: Secondary | ICD-10-CM | POA: Insufficient documentation

## 2012-11-14 DIAGNOSIS — N189 Chronic kidney disease, unspecified: Secondary | ICD-10-CM | POA: Insufficient documentation

## 2012-11-14 DIAGNOSIS — F3289 Other specified depressive episodes: Secondary | ICD-10-CM | POA: Insufficient documentation

## 2012-11-14 DIAGNOSIS — I129 Hypertensive chronic kidney disease with stage 1 through stage 4 chronic kidney disease, or unspecified chronic kidney disease: Secondary | ICD-10-CM | POA: Insufficient documentation

## 2012-11-14 DIAGNOSIS — E669 Obesity, unspecified: Secondary | ICD-10-CM | POA: Insufficient documentation

## 2012-11-14 DIAGNOSIS — I4819 Other persistent atrial fibrillation: Secondary | ICD-10-CM | POA: Diagnosis present

## 2012-11-14 DIAGNOSIS — E781 Pure hyperglyceridemia: Secondary | ICD-10-CM | POA: Insufficient documentation

## 2012-11-14 DIAGNOSIS — E119 Type 2 diabetes mellitus without complications: Secondary | ICD-10-CM | POA: Insufficient documentation

## 2012-11-14 DIAGNOSIS — Z6835 Body mass index (BMI) 35.0-35.9, adult: Secondary | ICD-10-CM | POA: Insufficient documentation

## 2012-11-14 DIAGNOSIS — I4891 Unspecified atrial fibrillation: Secondary | ICD-10-CM

## 2012-11-14 DIAGNOSIS — G4730 Sleep apnea, unspecified: Secondary | ICD-10-CM | POA: Insufficient documentation

## 2012-11-14 DIAGNOSIS — Z87891 Personal history of nicotine dependence: Secondary | ICD-10-CM | POA: Insufficient documentation

## 2012-11-14 DIAGNOSIS — Z7901 Long term (current) use of anticoagulants: Secondary | ICD-10-CM | POA: Insufficient documentation

## 2012-11-14 DIAGNOSIS — Z79899 Other long term (current) drug therapy: Secondary | ICD-10-CM | POA: Insufficient documentation

## 2012-11-14 HISTORY — PX: CARDIOVERSION: SHX1299

## 2012-11-14 SURGERY — CARDIOVERSION
Anesthesia: Monitor Anesthesia Care

## 2012-11-14 MED ORDER — SODIUM CHLORIDE 0.9 % IV SOLN
INTRAVENOUS | Status: DC | PRN
  Administered 2012-11-14: 13:00:00 via INTRAVENOUS

## 2012-11-14 MED ORDER — SODIUM CHLORIDE 0.9 % IV SOLN
INTRAVENOUS | Status: DC
  Administered 2012-11-14: 500 mL via INTRAVENOUS

## 2012-11-14 MED ORDER — PROPOFOL 10 MG/ML IV BOLUS
INTRAVENOUS | Status: DC | PRN
  Administered 2012-11-14: 150 mg via INTRAVENOUS

## 2012-11-14 MED ORDER — DEXTROSE-NACL 5-0.45 % IV SOLN: INTRAVENOUS | Status: DC

## 2012-11-14 MED ORDER — LIDOCAINE HCL (CARDIAC) 20 MG/ML IV SOLN
INTRAVENOUS | Status: DC | PRN
  Administered 2012-11-14: 40 mg via INTRAVENOUS

## 2012-11-14 NOTE — Transfer of Care (Signed)
Immediate Anesthesia Transfer of Care Note  Patient: [name] [name]  Procedure(s) Performed: Procedure(s): CARDIOVERSION (N/A)  Patient Location: Short Stay  Anesthesia Type:MAC  Level of Consciousness: awake, alert  and oriented  Airway & Oxygen Therapy: Patient Spontanous Breathing and Patient connected to nasal cannula oxygen  Post-op Assessment: Report given to PACU RN  Post vital signs: Reviewed and stable  Complications: No apparent anesthesia complications

## 2012-11-14 NOTE — Preoperative (Signed)
Beta Blockers   Reason not to administer Beta Blockers:Not Applicable 

## 2012-11-14 NOTE — CV Procedure (Signed)
[facility]  CARDIOVERSION NOTE   Procedure: Electrical Cardioversion Indications:  Atrial Fibrillation  Procedure Details:  Consent: Risks of procedure as well as the alternatives and risks of each were explained to the (patient/caregiver).  Consent for procedure obtained.  Time Out: Verified patient identification, verified procedure, site/side was marked, verified correct patient position, special equipment/implants available, medications/allergies/relevent history reviewed, required imaging and test results available.  Performed  Patient placed on cardiac monitor, pulse oximetry, supplemental oxygen as necessary.  Sedation given: Propofol per anesthesia Pacer pads placed anterior and posterior chest.  Cardioverted 2 time(s).  Cardioverted at 200J biphasic x 2.  Impression: Findings: Post procedure EKG shows: Normal sinus rhythm with frequent PAC Complications: None Patient did tolerate procedure well.  Time Spent Directly with the Patient:  30 minutes   [name], MD, [facility] Attending Cardiologist [facility]  [name] [name] C 11/14/2012, 1:18 PM

## 2012-11-14 NOTE — Anesthesia Postprocedure Evaluation (Signed)
  Anesthesia Post-op Note  Patient: [name] [name]  Procedure(s) Performed: Procedure(s): CARDIOVERSION (N/A)  Patient Location: Endoscopy Unit  Anesthesia Type:MAC  Level of Consciousness: awake, alert  and oriented  Airway and Oxygen Therapy: Patient Spontanous Breathing and Patient connected to nasal cannula oxygen  Post-op Pain: none  Post-op Assessment: Post-op Vital signs reviewed, Patient's Cardiovascular Status Stable, Respiratory Function Stable, Patent Airway, No signs of Nausea or vomiting and Pain level controlled  Post-op Vital Signs: Reviewed and stable  Complications: No apparent anesthesia complications

## 2012-11-14 NOTE — Anesthesia Preprocedure Evaluation (Addendum)
Anesthesia Evaluation  Patient identified by MRN, date of birth, ID band Patient awake    Reviewed: Allergy & Precautions, H&P , NPO status , Patient's Chart, lab work & pertinent test results, reviewed documented beta blocker date and time   Airway Mallampati: III TM Distance: <3 FB Neck ROM: Limited  Mouth opening: Limited Mouth Opening  Dental  (+) Teeth Intact   Pulmonary sleep apnea ,  breath sounds clear to auscultation  Pulmonary exam normal       Cardiovascular hypertension, Pt. on medications and Pt. on home beta blockers + CAD and + CABG + dysrhythmias Atrial Fibrillation Rhythm:Irregular     Neuro/Psych PSYCHIATRIC DISORDERS Depression    GI/Hepatic GERD-  ,  Endo/Other  diabetes  Renal/GU Renal disease     Musculoskeletal   Abdominal Normal abdominal exam  (+) + obese,   Peds  Hematology   Anesthesia Other Findings   Reproductive/Obstetrics                         Anesthesia Physical Anesthesia Plan  ASA: III  Anesthesia Plan: MAC   Post-op Pain Management:    Induction: Intravenous  Airway Management Planned: Mask  Additional Equipment:   Intra-op Plan:   Post-operative Plan:   Informed Consent: I have reviewed the patients History and Physical, chart, labs and discussed the procedure including the risks, benefits and alternatives for the proposed anesthesia with the patient or authorized representative who has indicated his/her understanding and acceptance.   Dental advisory given  Plan Discussed with: CRNA, Anesthesiologist and Surgeon  Anesthesia Plan Comments:         Anesthesia Quick Evaluation

## 2012-11-14 NOTE — H&P (Signed)
     [facility]          INTERVAL PROCEDURE H&P   History and Physical Interval Note:  11/14/2012 12:38 PM  [name] [name] has presented today for their planned procedure. The various methods of treatment have been discussed with the patient and family. After consideration of risks, benefits and other options for treatment, the patient has consented to the procedure.  The patients' outpatient history has been reviewed, patient examined, and no change in status from most recent office note within the past 30 days. I have reviewed the patients' chart and labs and will proceed as planned. Questions were answered to the patient's satisfaction.   [name], MD, [facility] Attending Cardiologist The [facility]  HILTY,Kenneth C 11/14/2012, 12:38 PM

## 2012-11-15 ENCOUNTER — Encounter (HOSPITAL_COMMUNITY): Payer: Self-pay | Admitting: Internal Medicine

## 2012-11-23 ENCOUNTER — Encounter: Payer: Self-pay | Admitting: *Deleted

## 2012-11-27 ENCOUNTER — Ambulatory Visit (INDEPENDENT_AMBULATORY_CARE_PROVIDER_SITE_OTHER): Payer: Managed Care, Other (non HMO) | Admitting: Physician Assistant

## 2012-11-27 ENCOUNTER — Encounter: Payer: Self-pay | Admitting: Internal Medicine

## 2012-11-27 ENCOUNTER — Encounter: Payer: Self-pay | Admitting: Physician Assistant

## 2012-11-27 VITALS — BP 166/86 | HR 73 | Ht 68.0 in | Wt 241.0 lb

## 2012-11-27 DIAGNOSIS — E1169 Type 2 diabetes mellitus with other specified complication: Secondary | ICD-10-CM

## 2012-11-27 DIAGNOSIS — E669 Obesity, unspecified: Secondary | ICD-10-CM | POA: Insufficient documentation

## 2012-11-27 DIAGNOSIS — I4891 Unspecified atrial fibrillation: Secondary | ICD-10-CM

## 2012-11-27 DIAGNOSIS — I10 Essential (primary) hypertension: Secondary | ICD-10-CM

## 2012-11-27 DIAGNOSIS — G4730 Sleep apnea, unspecified: Secondary | ICD-10-CM

## 2012-11-27 DIAGNOSIS — E1159 Type 2 diabetes mellitus with other circulatory complications: Secondary | ICD-10-CM

## 2012-11-27 NOTE — Assessment & Plan Note (Signed)
Status post direct current cardioversion this month. Maintaining sinus rhythm. Continuing Xarelto

## 2012-11-27 NOTE — Assessment & Plan Note (Signed)
Blood pressure is higher today than normal. The patient states that he was rather upset in the lobby of the clinic due to not being checked-in in an appropriate amount of time. On good meds currently

## 2012-11-27 NOTE — Assessment & Plan Note (Signed)
Patient indicates is 100% compliant. He takes the CPAP with him and he travels

## 2012-11-27 NOTE — Progress Notes (Signed)
Date:  11/27/2012   ID:  [name] [name], DOB February 28, 1956, MRN [id number]  PCP:  Carollee Herter, MD  Primary Cardiologist:  Rennis Golden    History of Present Illness: [name] [name] is a 57 y.o. male with a history of coronary disease and bypass surgery in 2005 along with hyperlipidemia, hypertriglyceridemia with triglycerides over 3000 in the past, obesity, atrial fibrillation. Patient's last crit catheterization was 11/18/2010 and showed grafts were widely patent. An LV gram was not done at that time because he has a solitary kidney. Patient was seen in April 2014 at which time he was in atrial fibrillation. He was started on Xarelto.  He was subsequently scheduled for a cardioversion which was completed on 11/14/2012. Presents today for followup to that cardioversion. Patient states that he felt great for the first few days after cardioversion and has, dyspneic again. EKG shows normal sinus rhythm with left bundle. He denies nausea, vomiting, chest pain, orthopnea, lower extremity edema. He does report that he does not exercise. Does report gaining 25 pounds since January when he quit smoking. He also admits to drinking a lot of soda on a daily basis.    Wt Readings from Last 3 Encounters:  11/27/12 241 lb (109.317 kg)  11/14/12 235 lb (106.595 kg)  11/14/12 235 lb (106.595 kg)     Past Medical History  Diagnosis Date  . Hypertension   . Obesity     with a BMI of 35  . Gout   . GERD (gastroesophageal reflux disease)   . Depression   . ASHD (arteriosclerotic heart disease)   . Diabetes mellitus   . Hyperlipidemia   . Hypertriglyceridemia     TRIGS OVER 3000 IN THE PAST; MOST RECENT TRIGS OF 306, HDL 36, VLDL 61, LDL 89  . Arrhythmia     A-FIB; ON XARELTO  . 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 History of tobacco abuse     PT QUIT 07/04/12  . OSA on CPAP   . Cardiomegaly     PER CXR RESULTS ON 11/16/10    Current Outpatient Prescriptions  Medication Sig  Dispense Refill  . allopurinol (ZYLOPRIM) 300 MG tablet take 1 tablet by mouth once daily  30 tablet  12  . amLODipine (NORVASC) 5 MG tablet take 1 tablet by mouth once daily  30 tablet  11  . aspirin 81 MG tablet Take 81 mg by mouth daily.        Marland Kitchen atenolol (TENORMIN) 50 MG tablet take 1 tablet by mouth once daily  30 tablet  11  . busPIRone (BUSPAR) 15 MG tablet take 1 tablet by mouth twice a day AT 10AM AND 5PM.  60 tablet  2  . Cholecalciferol (VITAMIN D-3) 1000 UNITS CAPS Take 1 capsule by mouth daily.      . Choline Fenofibrate (FENOFIBRIC ACID) 135 MG CPDR take 1 capsule by mouth once daily  30 capsule  11  . citalopram (CELEXA) 20 MG tablet take 1 tablet by mouth once daily  30 tablet  11  . levalbuterol (XOPENEX HFA) 45 MCG/ACT inhaler Inhale 1-2 puffs into the lungs every 4 (four) hours as needed for wheezing.  1 Inhaler  12  . omega-3 acid ethyl esters (LOVAZA) 1 G capsule Take 4 capsules (4 g total) by mouth daily.  120 capsule  11  . Rivaroxaban (XARELTO) 15 MG TABS tablet Take 15 mg by mouth daily.      Marland Kitchen  SIMCOR 1000-40 MG TB24 take 1 tablet by mouth once daily  30 tablet  11  . testosterone (TESTIM) 50 MG/5GM GEL Place 5 g onto the skin daily.  30 Tube  5  . valsartan-hydrochlorothiazide (DIOVAN-HCT) 320-12.5 MG per tablet take 1 tablet by mouth once daily  30 tablet  11   No current facility-administered medications for this visit.    Allergies:   No Known Allergies  Social History:  The patient  reports that he quit smoking about 4 months ago. His smoking use included Cigarettes. He smoked 1.00 pack per day. He has never used smokeless tobacco. He reports that he does not drink alcohol or use illicit drugs.   ROS:  Please see the history of present illness.  All other systems reviewed and negative.   PHYSICAL EXAM: VS:  BP 166/86  Pulse 73  Ht 5\' 8"  (1.727 m)  Wt 241 lb (109.317 kg)  BMI 36.65 kg/m2 Obese well developed, in no acute distress HEENT: Pupils are equal  round react to light accommodation extraocular movements are intact.  Cardiac: Regular rate and rhythm without murmurs rubs or gallops. Lungs:  clear to auscultation bilaterally, no wheezing, rhonchi or rales Ext: no lower extremity edema.  2+ radial and dorsalis pedis pulses. Skin: warm and dry Neuro:  Grossly normal  EKG:  Normal sinus rhythm intraventricular block T wave abnormalities inferiorly and laterally.    ASSESSMENT AND PLAN:  Problem List Items Addressed This Visit   Hypertension associated with diabetes (Chronic)     Blood pressure is higher today than normal. The patient states that he was rather upset in the lobby of the clinic due to not being checked-in in an appropriate amount of time. On good meds currently    Sleep apnea (Chronic)     Patient indicates is 100% compliant. He takes the CPAP with him and he travels    Atrial fibrillation - Primary     Status post direct current cardioversion this month. Maintaining sinus rhythm. Continuing Xarelto    Relevant Orders      EKG 12-Lead   Obesity: BMI 36.8     We discussed diet and exercise extensively. Patient indicated that he just needs to cut back on sodas which he drinks a lot of. I recommended 30-60 minutes of cardio every day but to ease into it.

## 2012-11-27 NOTE — Assessment & Plan Note (Signed)
We discussed diet and exercise extensively. Patient indicated that he just needs to cut back on sodas which he drinks a lot of. I recommended 30-60 minutes of cardio every day but to ease into it.

## 2012-12-22 ENCOUNTER — Other Ambulatory Visit: Payer: Self-pay | Admitting: Family Medicine

## 2012-12-24 NOTE — Telephone Encounter (Signed)
IS THIS OK 

## 2013-02-08 ENCOUNTER — Ambulatory Visit (INDEPENDENT_AMBULATORY_CARE_PROVIDER_SITE_OTHER): Payer: Managed Care, Other (non HMO) | Admitting: Internal Medicine

## 2013-02-08 ENCOUNTER — Encounter: Payer: Self-pay | Admitting: Internal Medicine

## 2013-02-08 VITALS — BP 130/70 | HR 78 | Ht 68.0 in | Wt 246.2 lb

## 2013-02-08 DIAGNOSIS — G4730 Sleep apnea, unspecified: Secondary | ICD-10-CM

## 2013-02-08 DIAGNOSIS — I4891 Unspecified atrial fibrillation: Secondary | ICD-10-CM

## 2013-02-08 DIAGNOSIS — N269 Renal sclerosis, unspecified: Secondary | ICD-10-CM

## 2013-02-08 DIAGNOSIS — E785 Hyperlipidemia, unspecified: Secondary | ICD-10-CM

## 2013-02-08 DIAGNOSIS — I2510 Atherosclerotic heart disease of native coronary artery without angina pectoris: Secondary | ICD-10-CM

## 2013-02-08 DIAGNOSIS — N261 Atrophy of kidney (terminal): Secondary | ICD-10-CM

## 2013-02-08 NOTE — Progress Notes (Signed)
Date:  02/08/2013   ID:  [name] [name], DOB 1956/04/18, MRN [id number]  PCP:  Carollee Herter, MD  Primary Cardiologist:  Rennis Golden    History of Present Illness: [name] [name] is a 57 y.o. male with a history of coronary disease and bypass surgery in 2005 along with hyperlipidemia, hypertriglyceridemia with triglycerides over 3000 in the past, obesity, atrial fibrillation. Patient's last crit catheterization was 11/18/2010 and showed grafts were widely patent. An LV gram was not done at that time because he has a solitary kidney. Patient was seen in April 2014 at which time he was in atrial fibrillation. He was started on Xarelto.  He was subsequently scheduled for a cardioversion which was completed on 11/14/2012. He has done very well since cardioversion and seems to maintain sinus rhythm. He is taking Xarelto without any bleeding difficulties.  He is wondering how long he needs to stay on Xarelto. Actually if you look at his risk factors for stroke there are significant. For such a young man at age 75, he has had coronary bypass surgery in 2005, he has a history of chronic kidney disease and a solitary kidney, he also has obesity, obstructive sleep apnea and dyslipidemia. He was also recently diagnosed with diabetes. Therefore his CHADS2 score is 3. He is CHADS2VASC score is 4, owing to a history of carotid artery disease. Unfortunately has not managed to lose much weight.  Wt Readings from Last 3 Encounters:  02/08/13 246 lb 3.2 oz (111.676 kg)  11/27/12 241 lb (109.317 kg)  11/14/12 235 lb (106.595 kg)     Past Medical History  Diagnosis Date  . Hypertension   . Obesity     with a BMI of 35  . Gout   . GERD (gastroesophageal reflux disease)   . Depression   . ASHD (arteriosclerotic heart disease)   . Diabetes mellitus   . Hyperlipidemia   . Hypertriglyceridemia     TRIGS OVER 3000 IN THE PAST; MOST RECENT TRIGS OF 306, HDL 36, VLDL 61, LDL 89  . Arrhythmia     A-FIB; ON XARELTO   . 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 History of tobacco abuse     PT QUIT 07/04/12  . OSA on CPAP   . Cardiomegaly     PER CXR RESULTS ON 11/16/10    Current Outpatient Prescriptions  Medication Sig Dispense Refill  . allopurinol (ZYLOPRIM) 300 MG tablet take 1 tablet by mouth once daily  30 tablet  12  . amLODipine (NORVASC) 5 MG tablet take 1 tablet by mouth once daily  30 tablet  11  . aspirin 81 MG tablet Take 81 mg by mouth daily.        Marland Kitchen atenolol (TENORMIN) 50 MG tablet take 1 tablet by mouth once daily  30 tablet  11  . busPIRone (BUSPAR) 15 MG tablet take 1 tablet by mouth twice a day AT 10AM AND 5PM.  60 tablet  5  . Cholecalciferol (VITAMIN D-3) 1000 UNITS CAPS Take 1 capsule by mouth daily.      . Choline Fenofibrate (FENOFIBRIC ACID) 135 MG CPDR take 1 capsule by mouth once daily  30 capsule  11  . citalopram (CELEXA) 20 MG tablet take 1 tablet by mouth once daily  30 tablet  11  . Lansoprazole (PREVACID PO) Take by mouth daily.      Marland Kitchen levalbuterol (XOPENEX HFA) 45 MCG/ACT inhaler Inhale 1-2 puffs into the  lungs every 4 (four) hours as needed for wheezing.  1 Inhaler  12  . omega-3 acid ethyl esters (LOVAZA) 1 G capsule Take 4 capsules (4 g total) by mouth daily.  120 capsule  11  . Rivaroxaban (XARELTO) 15 MG TABS tablet Take 15 mg by mouth daily.      Marland Kitchen SIMCOR 1000-40 MG TB24 take 1 tablet by mouth once daily  30 tablet  11  . testosterone (TESTIM) 50 MG/5GM GEL Place 5 g onto the skin daily.  30 Tube  5  . valsartan-hydrochlorothiazide (DIOVAN-HCT) 320-12.5 MG per tablet take 1 tablet by mouth once daily  30 tablet  11   No current facility-administered medications for this visit.    Allergies:   No Known Allergies  Social History:  The patient  reports that he quit smoking about 7 months ago. His smoking use included Cigarettes. He smoked 1.00 pack per day. He has never used smokeless tobacco. He reports that he does not drink alcohol or use  illicit drugs.   ROS:  Please see the history of present illness.  All other systems reviewed and negative.   PHYSICAL EXAM: VS:  BP 130/70  Pulse 78  Ht 5\' 8"  (1.727 m)  Wt 246 lb 3.2 oz (111.676 kg)  BMI 37.44 kg/m2 Obese well developed, in no acute distress HEENT: Pupils are equal round react to light accommodation extraocular movements are intact.  Cardiac: Regular rate and rhythm without murmurs rubs or gallops. Lungs:  clear to auscultation bilaterally, no wheezing, rhonchi or rales Ext: no lower extremity edema.  2+ radial and dorsalis pedis pulses. Skin: warm and dry Neuro:  Grossly normal  EKG:   Normal sinus rhythm at 78, nonspecific IVCD    ASSESSMENT AND PLAN:  Patient Active Problem List   Diagnosis Date Noted  . Obesity: BMI 36.8 11/27/2012  . Atrial fibrillation 10/15/2012  . Tobacco use disorder 07/07/2011  . Type II or unspecified type diabetes mellitus without mention of complication, not stated as uncontrolled 07/07/2011  . Hypogonadism male 06/23/2011  . ASHD (arteriosclerotic heart disease) 11/25/2010  . Hypertension associated with diabetes 11/25/2010  . Hyperlipidemia LDL goal <70 11/25/2010  . Sleep apnea 11/25/2010  . Depression 11/25/2010  . Atrophy of left kidney 11/25/2010  . 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  [name] [name] seems to be fairly stable and maintaining sinus rhythm. He's had no significant bleeding on Xarelto but does report some a.m. nose bleeds. This is probably from a dry nose secondary to using CPAP. I encouraged him to use a nasal spray of saline at night which may help decrease the frequency of that. Given his elevated CHADS2 and CHADSVASC scores, he should stay on Xarelto as long as it is well tolerated in addition to aspirin which is providing and prevention from coronary disease in his carotid artery disease. We'll plan to see him back in 6 months to year as necessary.  [name], MD, [facility] Attending Cardiologist [facility]

## 2013-02-08 NOTE — Patient Instructions (Addendum)
Your physician wants you to follow-up in:  6 months. You will receive a reminder letter in the mail two months in advance. If you don't receive a letter, please call our office to schedule the follow-up appointment.   

## 2013-03-13 ENCOUNTER — Other Ambulatory Visit: Payer: Self-pay

## 2013-03-13 ENCOUNTER — Encounter: Payer: Self-pay | Admitting: Family Medicine

## 2013-03-13 ENCOUNTER — Ambulatory Visit (INDEPENDENT_AMBULATORY_CARE_PROVIDER_SITE_OTHER): Payer: Managed Care, Other (non HMO) | Admitting: Family Medicine

## 2013-03-13 VITALS — BP 130/80 | HR 88 | Wt 250.0 lb

## 2013-03-13 DIAGNOSIS — R5383 Other fatigue: Secondary | ICD-10-CM

## 2013-03-13 DIAGNOSIS — Z8679 Personal history of other diseases of the circulatory system: Secondary | ICD-10-CM

## 2013-03-13 DIAGNOSIS — I10 Essential (primary) hypertension: Secondary | ICD-10-CM

## 2013-03-13 DIAGNOSIS — R5381 Other malaise: Secondary | ICD-10-CM

## 2013-03-13 DIAGNOSIS — E119 Type 2 diabetes mellitus without complications: Secondary | ICD-10-CM

## 2013-03-13 DIAGNOSIS — E1159 Type 2 diabetes mellitus with other circulatory complications: Secondary | ICD-10-CM

## 2013-03-13 DIAGNOSIS — I2510 Atherosclerotic heart disease of native coronary artery without angina pectoris: Secondary | ICD-10-CM

## 2013-03-13 DIAGNOSIS — Z23 Encounter for immunization: Secondary | ICD-10-CM

## 2013-03-13 DIAGNOSIS — E291 Testicular hypofunction: Secondary | ICD-10-CM

## 2013-03-13 DIAGNOSIS — E785 Hyperlipidemia, unspecified: Secondary | ICD-10-CM

## 2013-03-13 DIAGNOSIS — E1169 Type 2 diabetes mellitus with other specified complication: Secondary | ICD-10-CM

## 2013-03-13 LAB — POCT GLYCOSYLATED HEMOGLOBIN (HGB A1C): Hemoglobin A1C: 7.5

## 2013-03-13 MED ORDER — GLUCOSE BLOOD VI STRP: ORAL_STRIP | Status: AC

## 2013-03-13 MED ORDER — ONETOUCH DELICA LANCETS FINE MISC: 1.0000 | Freq: Two times a day (BID) | Status: DC

## 2013-03-13 MED ORDER — TESTOSTERONE 50 MG/5GM (1%) TD GEL: 5.0000 g | Freq: Every day | TRANSDERMAL | Status: DC

## 2013-03-13 MED ORDER — METFORMIN HCL ER (MOD) 500 MG PO TB24: 500.0000 mg | ORAL_TABLET | Freq: Every day | ORAL | Status: DC

## 2013-03-13 NOTE — Telephone Encounter (Signed)
SENT IN TEST STRIPS AND DELICA LANCETS

## 2013-03-13 NOTE — Patient Instructions (Signed)
Start checking your blood sugars either before meals or 2 hours after a meal. He is physically active as she possibly can

## 2013-03-13 NOTE — Progress Notes (Signed)
  Subjective:    Patient ID: [name] [name], male    DOB: 01/16/1956, 57 y.o.   MRN: [id number]  HPI He is here for routine followup. In January his hemoglobin A1c was 6.5. Since then he has had difficulty with atrial fibrillation and has had an ablation. He states that in spite of this he continues to have difficulty with fatigue, shortness of breath and DOE. He states that he has to stop in between gates at the airport to catch his breath. This has interfered with his ability to exercise. His eating habits are unchanged and at this time he is unwilling to see the dietitian. He continues on Xarelto and does note some slight difficulty with bleeding from his gums. He continues on other medications listed in the chart. He has quit smoking which has caused an increase in his weight. He would also like to switch back to [place]. He does not like testing because is quite sticky. Review of Systems     Objective:   Physical Exam Alert and in no distress. Cardiac exam does show an irregular rhythm. EKG shows a PVC and PAC. Hb A1c is 7.5       Assessment & Plan:  Type II or unspecified type diabetes mellitus without mention of complication, not stated as uncontrolled - Plan: POCT glycosylated hemoglobin (Hb A1C), metFORMIN (GLUMETZA) 500 MG (MOD) 24 hr tablet  Need for prophylactic vaccination and inoculation against influenza - Plan: Flu Vaccine QUAD 36+ mos IM  History of atrial fibrillation  Fatigue  ASHD (arteriosclerotic heart disease)  Hypertension associated with diabetes  Hyperlipidemia LDL goal <70  Hypogonadism male - Plan: testosterone (TESTIM) 50 MG/5GM GEL  he is to start checking his blood sugars. Encouraged him to become more physically active. I will send a note to Dr. Rennis Golden concerning the possibility of doing an echo to further evaluate his shortness of breath and DOE.

## 2013-03-14 ENCOUNTER — Telehealth: Payer: Self-pay | Admitting: *Deleted

## 2013-03-14 DIAGNOSIS — R0600 Dyspnea, unspecified: Secondary | ICD-10-CM

## 2013-03-14 NOTE — Telephone Encounter (Signed)
Called patient to speak with about DOE and echo orders. Patient agreed to have test. Echo ordered and patient notified that scheduling would contact him to set test up.

## 2013-03-14 NOTE — Telephone Encounter (Signed)
Message copied by Lindell Spar on Thu Mar 14, 2013  8:37 AM ------      Message from: [name]      Created: Wed Mar 13, 2013  5:14 PM       I can go ahead an order an echo, but I suspect weight is a big factor for his shortness of breath. We'll order the echo and I'll let you know what it shows. He did not really raise the concern of shortness of breath at his last visit with me.            -[place]            ----- Message -----         From: Ronnald Nian, MD         Sent: 03/13/2013   4:13 PM           To: [name], MD            He was seen by me today. He is still having difficulty with DOE. I'm wondering if an echo might help straighten this out. His EKG showed a sinus rhythm with a PAC and PVC.      Thanks for your help      Sharlot Gowda       ------

## 2013-03-16 ENCOUNTER — Other Ambulatory Visit: Payer: Self-pay | Admitting: Family Medicine

## 2013-03-18 NOTE — Telephone Encounter (Signed)
Is this okay to refill? 

## 2013-03-18 NOTE — Telephone Encounter (Signed)
Okay to renew with 5 refills 

## 2013-03-19 NOTE — Telephone Encounter (Signed)
Called in med to pharmacy  

## 2013-03-25 ENCOUNTER — Ambulatory Visit (HOSPITAL_COMMUNITY)
Admission: RE | Admit: 2013-03-25 | Discharge: 2013-03-25 | Disposition: A | Discharge status: Home / Self Care | Payer: Managed Care, Other (non HMO) | Source: Ambulatory Visit | Attending: Internal Medicine | Admitting: Internal Medicine

## 2013-03-25 DIAGNOSIS — R0989 Other specified symptoms and signs involving the circulatory and respiratory systems: Secondary | ICD-10-CM | POA: Insufficient documentation

## 2013-03-25 DIAGNOSIS — R0609 Other forms of dyspnea: Secondary | ICD-10-CM | POA: Insufficient documentation

## 2013-03-25 DIAGNOSIS — R0600 Dyspnea, unspecified: Secondary | ICD-10-CM

## 2013-03-25 DIAGNOSIS — R0602 Shortness of breath: Secondary | ICD-10-CM

## 2013-03-25 NOTE — Progress Notes (Signed)
2D Echo Performed 03/25/2013    Tuwanna Krausz, RCS  

## 2013-03-26 ENCOUNTER — Telehealth: Payer: Self-pay | Admitting: *Deleted

## 2013-03-26 DIAGNOSIS — R0602 Shortness of breath: Secondary | ICD-10-CM

## 2013-03-26 DIAGNOSIS — Z79899 Other long term (current) drug therapy: Secondary | ICD-10-CM

## 2013-03-26 NOTE — Telephone Encounter (Signed)
Message copied by Lindell Spar on Tue Mar 26, 2013  9:43 AM ------      Message from: [name]      Created: Tue Mar 26, 2013  9:22 AM       Please notify patient that the echo shows his EF has reduced, which likely explains his shortness of breath.  This could have been related to his atrial fibrillation or may suggest a problem with the bypass grafts. Please have him get a CMP and BNP within a week and follow-up with me next week. I will likely need to start diuretics.            -Dr. Rennis Golden       ------

## 2013-03-26 NOTE — Telephone Encounter (Signed)
Called patient with echo results and need for further labs and OV with Dr. Rennis Golden. Patient verbalized understanding and agreed with plan. Orders placed for CMP & BNP and lab slips mailed to patient.

## 2013-04-02 LAB — COMPREHENSIVE METABOLIC PANEL
ALT: 15 U/L (ref 0–53)
AST: 17 U/L (ref 0–37)
Albumin: 3.4 g/dL — ABNORMAL LOW (ref 3.5–5.2)
Alkaline Phosphatase: 32 U/L — ABNORMAL LOW (ref 39–117)
BUN: 39 mg/dL — ABNORMAL HIGH (ref 6–23)
Calcium: 8.8 mg/dL (ref 8.4–10.5)
Chloride: 103 mEq/L (ref 96–112)
Glucose, Bld: 150 mg/dL — ABNORMAL HIGH (ref 70–99)
Potassium: 3.9 mEq/L (ref 3.5–5.3)
Sodium: 136 mEq/L (ref 135–145)
Total Bilirubin: 0.5 mg/dL (ref 0.3–1.2)
Total Protein: 6.2 g/dL (ref 6.0–8.3)

## 2013-04-03 ENCOUNTER — Telehealth: Payer: Self-pay | Admitting: *Deleted

## 2013-04-03 DIAGNOSIS — Z79899 Other long term (current) drug therapy: Secondary | ICD-10-CM

## 2013-04-03 DIAGNOSIS — R0602 Shortness of breath: Secondary | ICD-10-CM

## 2013-04-03 HISTORY — PX: CARDIAC CATHETERIZATION: SHX172

## 2013-04-03 MED ORDER — FUROSEMIDE 40 MG PO TABS: 40.0000 mg | ORAL_TABLET | Freq: Every day | ORAL | Status: DC

## 2013-04-03 NOTE — Telephone Encounter (Signed)
Message copied by Lindell Spar on Wed Apr 03, 2013 10:24 AM ------      Message from: [name]      Created: Wed Apr 03, 2013  8:16 AM       His labs do show congestive heart failure. I would recommend he start on lasix 40 mg daily.  I would like a repeat BMP and BNP in 1 week. Follow-up appointment with me in 2 weeks. Please let him know.            -Dr. Rennis Golden ------

## 2013-04-11 LAB — BASIC METABOLIC PANEL
BUN: 71 mg/dL — ABNORMAL HIGH (ref 6–23)
Calcium: 8.9 mg/dL (ref 8.4–10.5)
Creat: 2.6 mg/dL — ABNORMAL HIGH (ref 0.50–1.35)
Glucose, Bld: 238 mg/dL — ABNORMAL HIGH (ref 70–99)
Sodium: 136 mEq/L (ref 135–145)

## 2013-04-12 LAB — BRAIN NATRIURETIC PEPTIDE: Brain Natriuretic Peptide: 318.6 pg/mL — ABNORMAL HIGH (ref 0.0–100.0)

## 2013-04-18 ENCOUNTER — Encounter (HOSPITAL_COMMUNITY): Payer: Self-pay | Admitting: General Practice

## 2013-04-18 ENCOUNTER — Ambulatory Visit (INDEPENDENT_AMBULATORY_CARE_PROVIDER_SITE_OTHER): Payer: Managed Care, Other (non HMO) | Admitting: Internal Medicine

## 2013-04-18 ENCOUNTER — Encounter: Payer: Self-pay | Admitting: Internal Medicine

## 2013-04-18 ENCOUNTER — Inpatient Hospital Stay (HOSPITAL_COMMUNITY)
Admission: AD | Admit: 2013-04-18 | Discharge: 2013-04-27 | DRG: 286 | Disposition: A | Discharge status: Home / Self Care | Payer: Managed Care, Other (non HMO) | Source: Ambulatory Visit | Attending: Internal Medicine | Admitting: Internal Medicine

## 2013-04-18 ENCOUNTER — Inpatient Hospital Stay (HOSPITAL_COMMUNITY): Payer: Managed Care, Other (non HMO)

## 2013-04-18 VITALS — BP 128/70 | HR 113 | Ht 68.0 in | Wt 250.8 lb

## 2013-04-18 DIAGNOSIS — Z7901 Long term (current) use of anticoagulants: Secondary | ICD-10-CM

## 2013-04-18 DIAGNOSIS — I509 Heart failure, unspecified: Secondary | ICD-10-CM | POA: Diagnosis present

## 2013-04-18 DIAGNOSIS — G4730 Sleep apnea, unspecified: Secondary | ICD-10-CM | POA: Diagnosis present

## 2013-04-18 DIAGNOSIS — Z79899 Other long term (current) drug therapy: Secondary | ICD-10-CM

## 2013-04-18 DIAGNOSIS — Z951 Presence of aortocoronary bypass graft: Secondary | ICD-10-CM

## 2013-04-18 DIAGNOSIS — N269 Renal sclerosis, unspecified: Secondary | ICD-10-CM

## 2013-04-18 DIAGNOSIS — Q964 Mosaicism, 45, X/other cell line(s) with abnormal sex chromosome: Secondary | ICD-10-CM

## 2013-04-18 DIAGNOSIS — N261 Atrophy of kidney (terminal): Secondary | ICD-10-CM

## 2013-04-18 DIAGNOSIS — I255 Ischemic cardiomyopathy: Secondary | ICD-10-CM

## 2013-04-18 DIAGNOSIS — G4733 Obstructive sleep apnea (adult) (pediatric): Secondary | ICD-10-CM | POA: Diagnosis present

## 2013-04-18 DIAGNOSIS — I4891 Unspecified atrial fibrillation: Secondary | ICD-10-CM

## 2013-04-18 DIAGNOSIS — I2510 Atherosclerotic heart disease of native coronary artery without angina pectoris: Secondary | ICD-10-CM | POA: Diagnosis present

## 2013-04-18 DIAGNOSIS — F3289 Other specified depressive episodes: Secondary | ICD-10-CM

## 2013-04-18 DIAGNOSIS — E291 Testicular hypofunction: Secondary | ICD-10-CM

## 2013-04-18 DIAGNOSIS — I5023 Acute on chronic systolic (congestive) heart failure: Secondary | ICD-10-CM | POA: Diagnosis not present

## 2013-04-18 DIAGNOSIS — Z87891 Personal history of nicotine dependence: Secondary | ICD-10-CM

## 2013-04-18 DIAGNOSIS — N189 Chronic kidney disease, unspecified: Secondary | ICD-10-CM

## 2013-04-18 DIAGNOSIS — E669 Obesity, unspecified: Secondary | ICD-10-CM | POA: Diagnosis present

## 2013-04-18 DIAGNOSIS — F329 Major depressive disorder, single episode, unspecified: Secondary | ICD-10-CM | POA: Diagnosis present

## 2013-04-18 DIAGNOSIS — I5021 Acute systolic (congestive) heart failure: Secondary | ICD-10-CM

## 2013-04-18 DIAGNOSIS — I129 Hypertensive chronic kidney disease with stage 1 through stage 4 chronic kidney disease, or unspecified chronic kidney disease: Secondary | ICD-10-CM | POA: Diagnosis present

## 2013-04-18 DIAGNOSIS — M109 Gout, unspecified: Secondary | ICD-10-CM | POA: Diagnosis present

## 2013-04-18 DIAGNOSIS — I2589 Other forms of chronic ischemic heart disease: Secondary | ICD-10-CM | POA: Diagnosis present

## 2013-04-18 DIAGNOSIS — Z7982 Long term (current) use of aspirin: Secondary | ICD-10-CM

## 2013-04-18 DIAGNOSIS — E1159 Type 2 diabetes mellitus with other circulatory complications: Secondary | ICD-10-CM | POA: Diagnosis present

## 2013-04-18 DIAGNOSIS — K219 Gastro-esophageal reflux disease without esophagitis: Secondary | ICD-10-CM | POA: Diagnosis present

## 2013-04-18 DIAGNOSIS — N183 Chronic kidney disease, stage 3 unspecified: Secondary | ICD-10-CM

## 2013-04-18 DIAGNOSIS — E119 Type 2 diabetes mellitus without complications: Secondary | ICD-10-CM

## 2013-04-18 DIAGNOSIS — E785 Hyperlipidemia, unspecified: Secondary | ICD-10-CM

## 2013-04-18 DIAGNOSIS — I2789 Other specified pulmonary heart diseases: Secondary | ICD-10-CM | POA: Diagnosis present

## 2013-04-18 DIAGNOSIS — F32A Depression, unspecified: Secondary | ICD-10-CM

## 2013-04-18 DIAGNOSIS — Q602 Renal agenesis, unspecified: Secondary | ICD-10-CM

## 2013-04-18 DIAGNOSIS — I4819 Other persistent atrial fibrillation: Secondary | ICD-10-CM

## 2013-04-18 DIAGNOSIS — Q998 Other specified chromosome abnormalities: Secondary | ICD-10-CM

## 2013-04-18 DIAGNOSIS — E871 Hypo-osmolality and hyponatremia: Secondary | ICD-10-CM | POA: Diagnosis not present

## 2013-04-18 DIAGNOSIS — T502X5A Adverse effect of carbonic-anhydrase inhibitors, benzothiadiazides and other diuretics, initial encounter: Secondary | ICD-10-CM | POA: Diagnosis not present

## 2013-04-18 DIAGNOSIS — N179 Acute kidney failure, unspecified: Secondary | ICD-10-CM | POA: Diagnosis not present

## 2013-04-18 HISTORY — DX: Shortness of breath: R06.02

## 2013-04-18 HISTORY — DX: Long term (current) use of anticoagulants: Z79.01

## 2013-04-18 HISTORY — DX: Other persistent atrial fibrillation: I48.19

## 2013-04-18 LAB — URINALYSIS, ROUTINE W REFLEX MICROSCOPIC
Bilirubin Urine: NEGATIVE
Glucose, UA: NEGATIVE mg/dL
Ketones, ur: NEGATIVE mg/dL
Leukocytes, UA: NEGATIVE
Nitrite: NEGATIVE
Protein, ur: >300 mg/dL — AB
Specific Gravity, Urine: 1.013 (ref 1.005–1.030)
Urobilinogen, UA: 0.2 mg/dL (ref 0.0–1.0)
pH: 5 (ref 5.0–8.0)

## 2013-04-18 LAB — APTT: aPTT: 52 seconds — ABNORMAL HIGH (ref 24–37)

## 2013-04-18 LAB — BASIC METABOLIC PANEL
BUN: 68 mg/dL — ABNORMAL HIGH (ref 6–23)
Calcium: 9.3 mg/dL (ref 8.4–10.5)
Chloride: 99 mEq/L (ref 96–112)
GFR calc Af Amer: 30 mL/min — ABNORMAL LOW (ref >90)
GFR calc non Af Amer: 26 mL/min — ABNORMAL LOW (ref >90)
Glucose, Bld: 193 mg/dL — ABNORMAL HIGH (ref 70–99)
Potassium: 3.9 mEq/L (ref 3.5–5.1)
Sodium: 137 mEq/L (ref 135–145)

## 2013-04-18 LAB — CBC
Hemoglobin: 14.1 g/dL (ref 13.0–17.0)
MCH: 33.2 pg (ref 26.0–34.0)
MCHC: 35.5 g/dL (ref 30.0–36.0)
Platelets: 182 10*3/uL (ref 150–400)
RBC: 4.25 MIL/uL (ref 4.22–5.81)
RDW: 14.2 % (ref 11.5–15.5)
WBC: 8.8 10*3/uL (ref 4.0–10.5)

## 2013-04-18 LAB — GLUCOSE, CAPILLARY: Glucose-Capillary: 212 mg/dL — ABNORMAL HIGH (ref 70–99)

## 2013-04-18 LAB — URINE MICROSCOPIC-ADD ON

## 2013-04-18 LAB — PROTEIN / CREATININE RATIO, URINE
Creatinine, Urine: 81.22 mg/dL
Total Protein, Urine: 246.7 mg/dL

## 2013-04-18 MED ORDER — SODIUM CHLORIDE 0.9 % IV SOLN
250.0000 mL | INTRAVENOUS | Status: DC
  Administered 2013-04-18 – 2013-04-21 (×2): 250 mL via INTRAVENOUS

## 2013-04-18 MED ORDER — ASPIRIN 81 MG PO TABS: 81.0000 mg | ORAL_TABLET | Freq: Every day | ORAL | Status: DC

## 2013-04-18 MED ORDER — CITALOPRAM HYDROBROMIDE 20 MG PO TABS
20.0000 mg | ORAL_TABLET | Freq: Every day | ORAL | Status: DC
  Administered 2013-04-19 – 2013-04-27 (×9): 20 mg via ORAL
  Filled 2013-04-18 (×9): qty 1

## 2013-04-18 MED ORDER — ATENOLOL 50 MG PO TABS
50.0000 mg | ORAL_TABLET | Freq: Every day | ORAL | Status: DC
  Administered 2013-04-19 – 2013-04-20 (×2): 50 mg via ORAL
  Filled 2013-04-18 (×2): qty 1

## 2013-04-18 MED ORDER — ALLOPURINOL 300 MG PO TABS
300.0000 mg | ORAL_TABLET | Freq: Every day | ORAL | Status: DC
  Administered 2013-04-19 – 2013-04-27 (×9): 300 mg via ORAL
  Filled 2013-04-18 (×9): qty 1

## 2013-04-18 MED ORDER — IRBESARTAN 300 MG PO TABS: 300.0000 mg | ORAL_TABLET | Freq: Every day | ORAL | Status: DC

## 2013-04-18 MED ORDER — PANTOPRAZOLE SODIUM 20 MG PO TBEC
20.0000 mg | DELAYED_RELEASE_TABLET | Freq: Every day | ORAL | Status: DC
  Administered 2013-04-18 – 2013-04-27 (×10): 20 mg via ORAL
  Filled 2013-04-18 (×10): qty 1

## 2013-04-18 MED ORDER — LEVALBUTEROL TARTRATE 45 MCG/ACT IN AERO: 1.0000 | INHALATION_SPRAY | RESPIRATORY_TRACT | Status: DC | PRN

## 2013-04-18 MED ORDER — HYDROCHLOROTHIAZIDE 12.5 MG PO CAPS: 12.5000 mg | ORAL_CAPSULE | Freq: Every day | ORAL | Status: DC

## 2013-04-18 MED ORDER — HEPARIN (PORCINE) IN NACL 100-0.45 UNIT/ML-% IJ SOLN
2050.0000 [IU]/h | INTRAMUSCULAR | Status: DC
  Administered 2013-04-19: 09:00:00 1200 [IU]/h via INTRAVENOUS
  Administered 2013-04-20 – 2013-04-21 (×3): 1450 [IU]/h via INTRAVENOUS
  Administered 2013-04-21: 1700 [IU]/h via INTRAVENOUS
  Administered 2013-04-22: 01:00:00 2000 [IU]/h via INTRAVENOUS
  Administered 2013-04-22 (×2): 2050 [IU]/h via INTRAVENOUS
  Administered 2013-04-22: 2000 [IU]/h via INTRAVENOUS
  Administered 2013-04-23: 2050 [IU]/h via INTRAVENOUS
  Filled 2013-04-18 (×12): qty 250

## 2013-04-18 MED ORDER — AMIODARONE HCL IN DEXTROSE 360-4.14 MG/200ML-% IV SOLN
30.0000 mg/h | INTRAVENOUS | Status: AC
  Administered 2013-04-18 – 2013-04-19 (×3): 30 mg/h via INTRAVENOUS
  Filled 2013-04-18 (×7): qty 200

## 2013-04-18 MED ORDER — AMIODARONE LOAD VIA INFUSION
150.0000 mg | Freq: Once | INTRAVENOUS | Status: AC
  Administered 2013-04-18: 15:00:00 150 mg via INTRAVENOUS
  Filled 2013-04-18: qty 83.34

## 2013-04-18 MED ORDER — SODIUM CHLORIDE 0.9 % IJ SOLN
3.0000 mL | Freq: Two times a day (BID) | INTRAMUSCULAR | Status: DC
  Administered 2013-04-18 – 2013-04-21 (×6): 3 mL via INTRAVENOUS

## 2013-04-18 MED ORDER — AMIODARONE HCL IN DEXTROSE 360-4.14 MG/200ML-% IV SOLN
30.0000 mg/h | INTRAVENOUS | Status: DC
  Administered 2013-04-18: 19:00:00 30 mg/h via INTRAVENOUS
  Filled 2013-04-18 (×2): qty 200

## 2013-04-18 MED ORDER — ASPIRIN EC 81 MG PO TBEC
81.0000 mg | DELAYED_RELEASE_TABLET | Freq: Every day | ORAL | Status: DC
  Administered 2013-04-18 – 2013-04-27 (×9): 81 mg via ORAL
  Filled 2013-04-18 (×11): qty 1

## 2013-04-18 MED ORDER — METFORMIN HCL ER 500 MG PO TB24: 500.0000 mg | ORAL_TABLET | Freq: Every day | ORAL | Status: DC

## 2013-04-18 MED ORDER — AMIODARONE HCL IN DEXTROSE 360-4.14 MG/200ML-% IV SOLN
60.0000 mg/h | INTRAVENOUS | Status: AC
  Filled 2013-04-18: qty 200

## 2013-04-18 MED ORDER — VITAMIN D3 25 MCG (1000 UNIT) PO TABS
1000.0000 [IU] | ORAL_TABLET | Freq: Every day | ORAL | Status: DC
  Administered 2013-04-18 – 2013-04-27 (×10): 1000 [IU] via ORAL
  Filled 2013-04-18 (×10): qty 1

## 2013-04-18 MED ORDER — VALSARTAN-HYDROCHLOROTHIAZIDE 320-12.5 MG PO TABS: 1.0000 | ORAL_TABLET | Freq: Every day | ORAL | Status: DC

## 2013-04-18 MED ORDER — AMIODARONE HCL IN DEXTROSE 360-4.14 MG/200ML-% IV SOLN
60.0000 mg/h | INTRAVENOUS | Status: AC
  Administered 2013-04-18: 15:00:00 60 mg/h via INTRAVENOUS
  Filled 2013-04-18 (×2): qty 200

## 2013-04-18 MED ORDER — HYDROCORTISONE 1 % EX CREA
1.0000 "application " | TOPICAL_CREAM | Freq: Three times a day (TID) | CUTANEOUS | Status: DC | PRN
  Filled 2013-04-18: qty 28

## 2013-04-18 MED ORDER — VITAMIN D-3 25 MCG (1000 UT) PO CAPS: 1.0000 | ORAL_CAPSULE | Freq: Every day | ORAL | Status: DC

## 2013-04-18 MED ORDER — SODIUM CHLORIDE 0.9 % IJ SOLN
3.0000 mL | INTRAMUSCULAR | Status: DC | PRN
  Administered 2013-04-21 – 2013-04-22 (×2): 3 mL via INTRAVENOUS

## 2013-04-18 NOTE — Progress Notes (Signed)
ADMISSION HISTORY & PHYSICAL   Chief Complaint:  Dyspnea, fatigue, cough  Cardiologist: Li Bobo  Primary Care Physician: Carollee Herter, MD  HPI:  This is a 57 y.o. male with a past medical history significant for coronary artery disease and bypass surgery x 3 vessels in 2005 (LIMA to LAD, SVG to DIAG, and SVG to OM1/OM2). He also has hyperlipidemia and hypertriglyceridemia with triglycerides of over 3000 in the past, morbid obesity, OSA on CPAP and recently atrial fibrillation. Most recent labs showed triglycerides of 306, HDL of 36, VLDL 61 and LDL of 89. He had an outpatient cardiac catheterization on Nov 18, 2010 that showed the grafts were widely patent. Because of a solitary kidney he did not have an LVgram. His last creatinine October 11, 2012 was 1.58. The BUN was 31. The patient was seen on October 10, 2012 and was found to be in atrial fibrillation. He was scheduled for repeat cardioversion and had been placed on Xarelto at that time. Dr. Rennis Golden did not feel it was safe without general anesthesia to do the TEE due to his history of obstructive sleep apnea and relatively thick neck. The patient also had interrupted his Xarelto for 1 day due to gingival bleeding but he had restarted it on Monday, April 14, and has been taking it ever since without any recurrent bleeding. He did undergo successful cardioversion to sinus rhythm and reported that he felt better. Subsequently he had an echocardiogram which showed a newly reduced ejection fraction of 35%. This is in the setting of worsening shortness of breath. I recently started him on low-dose Lasix however his creatinine is rising and is now over 3. He has never seen a nephrologist. As above, he is known to have a solitary kidney. Today he returns and reports markedly increased shortness of breath without much improvement with diuresis. It is noted that he is in atrial fibrillation with rapid ventricular response. I suspect either this or  coronary bypass graft dysfunction may be contributing to his heart failure.   PMHx:  Past Medical History  Diagnosis Date  . Hypertension   . Obesity     with a BMI of 35  . Gout   . GERD (gastroesophageal reflux disease)   . Depression   . ASHD (arteriosclerotic heart disease)   . Diabetes mellitus   . Hyperlipidemia   . Hypertriglyceridemia     TRIGS OVER 3000 IN THE PAST; MOST RECENT TRIGS OF 306, HDL 36, VLDL 61, LDL 89  . Arrhythmia     A-FIB; ON XARELTO  . 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 History of tobacco abuse     PT QUIT 07/04/12  . OSA on CPAP   . Cardiomegaly     PER CXR RESULTS ON 11/16/10  . CHF (congestive heart failure)   . Shortness of breath     Past Surgical History  Procedure Laterality Date  . Appendectomy  1983  . Cardioversion N/A 11/14/2012    Procedure: CARDIOVERSION;  Surgeon: [name], MD;  Location: [facility] ENDOSCOPY;  Service: Cardiovascular;  Laterality: N/A;  . Coronary artery bypass graft  05/11/04    X 4; HAS A VERY SHORT AORTA, ROUND DILATED HEART AS WELL AS A STERNAL ABNORMALITY   . Cardiac catheterization  11/18/10    GRAFTS WIDELY PATENT WITH NO HIGH-GRADE CAD  . Carotids  04/03/12    CAROTID DUPLEX; RIGHT BULB/PROXIMAL ICA: MILD AMT OF FIBROUS PLAQUE  SLIGHTLY ELEVATING VELOCITIES WITHIN THE PROXIMAL SEGMENT OF THE INTERNAL CAROTID  ARTERY  . Nm myoview ltd  09/17/09    EF 49%; GLOBAL LV SYSTOLIC FUNCTION MILDLY REDUCED    FAMHx:  Family History  Problem Relation Age of Onset  . Heart disease Father   . Cancer Father 43    LUNG  . Hypertension Father   . Diabetes Brother   . Stroke Maternal Grandmother 60  . Heart disease Paternal Grandmother     SOCHx:   reports that he quit smoking about 9 months ago. His smoking use included Cigarettes. He smoked 1.00 pack per day. He has never used smokeless tobacco. He reports that he does not drink alcohol or use illicit drugs.  ALLERGIES:  No Known  Allergies  ROS: A comprehensive review of systems was negative except for: Constitutional: positive for fatigue and weight gain Respiratory: positive for cough and dyspnea on exertion Cardiovascular: positive for dyspnea and irregular heart beat  HOME MEDS: Medications Prior to Admission  Medication Sig Dispense Refill  . acetaminophen (TYLENOL) 500 MG tablet Take 1,000 mg by mouth every 6 (six) hours as needed for pain.      Marland Kitchen allopurinol (ZYLOPRIM) 300 MG tablet take 1 tablet by mouth once daily  30 tablet  12  . amLODipine (NORVASC) 5 MG tablet take 1 tablet by mouth once daily  30 tablet  11  . aspirin EC 81 MG tablet Take 81 mg by mouth at bedtime.      Marland Kitchen atenolol (TENORMIN) 50 MG tablet take 1 tablet by mouth once daily  30 tablet  11  . busPIRone (BUSPAR) 15 MG tablet take 1 tablet by mouth twice a day AT 10AM AND 5PM.  60 tablet  5  . cholecalciferol (VITAMIN D) 1000 UNITS tablet Take 1,000 Units by mouth daily.      . Choline Fenofibrate (TRILIPIX) 135 MG capsule Take 135 mg by mouth daily.      . citalopram (CELEXA) 20 MG tablet take 1 tablet by mouth once daily  30 tablet  11  . FORTESTA 10 MG/ACT (2%) GEL apply 4 PUMPS once daily to SKIN  60 g  5  . furosemide (LASIX) 40 MG tablet Take 1 tablet (40 mg total) by mouth daily.  30 tablet  11  . Lansoprazole (PREVACID PO) Take 1 capsule by mouth at bedtime.      . levalbuterol (XOPENEX HFA) 45 MCG/ACT inhaler Inhale 1-2 puffs into the lungs every 4 (four) hours as needed for wheezing.  1 Inhaler  12  . metFORMIN (GLUMETZA) 500 MG (MOD) 24 hr tablet Take 1 tablet (500 mg total) by mouth daily with breakfast.  60 tablet  5  . omega-3 acid ethyl esters (LOVAZA) 1 G capsule Take 2 g by mouth 2 (two) times daily.      . Rivaroxaban (XARELTO) 15 MG TABS tablet Take 15 mg by mouth daily.      Marland Kitchen SIMCOR 1000-40 MG TB24 take 1 tablet by mouth once daily  30 tablet  11  . valsartan-hydrochlorothiazide (DIOVAN-HCT) 320-12.5 MG per tablet take  1 tablet by mouth once daily  30 tablet  11  . glucose blood test strip Use as instructed(THIS IS FOR THE ONETOUCH VERIO IQ )  100 each  PRN  . ONETOUCH DELICA LANCETS FINE MISC 1 each by Does not apply route 2 (two) times daily.  100 each  PRN    LABS/IMAGING: Results for orders placed during the  hospital encounter of 04/18/13 (from the past 48 hour(s))  GLUCOSE, CAPILLARY     Status: Abnormal   Collection Time    04/18/13 12:12 PM      Result Value Range   Glucose-Capillary 212 (*) 70 - 99 mg/dL  CBC     Status: None   Collection Time    04/18/13 12:58 PM      Result Value Range   WBC 8.8  4.0 - 10.5 K/uL   RBC 4.25  4.22 - 5.81 MIL/uL   Hemoglobin 14.1  13.0 - 17.0 g/dL   HCT 16.1  09.6 - 04.5 %   MCV 93.4  78.0 - 100.0 fL   MCH 33.2  26.0 - 34.0 pg   MCHC 35.5  30.0 - 36.0 g/dL   RDW 40.9  81.1 - 91.4 %   Platelets 182  150 - 400 K/uL  BASIC METABOLIC PANEL     Status: Abnormal   Collection Time    04/18/13 12:58 PM      Result Value Range   Sodium 137  135 - 145 mEq/L   Potassium 3.9  3.5 - 5.1 mEq/L   Chloride 99  96 - 112 mEq/L   CO2 24  19 - 32 mEq/L   Glucose, Bld 193 (*) 70 - 99 mg/dL   BUN 68 (*) 6 - 23 mg/dL   Creatinine, Ser 7.82 (*) 0.50 - 1.35 mg/dL   Calcium 9.3  8.4 - 95.6 mg/dL   GFR calc non Af Amer 26 (*) >90 mL/min   GFR calc Af Amer 30 (*) >90 mL/min   Comment: (NOTE)     The eGFR has been calculated using the CKD EPI equation.     This calculation has not been validated in all clinical situations.     eGFR's persistently <90 mL/min signify possible Chronic Kidney     Disease.  APTT     Status: Abnormal   Collection Time    04/18/13 12:58 PM      Result Value Range   aPTT 52 (*) 24 - 37 seconds   Comment:            IF BASELINE aPTT IS ELEVATED,     SUGGEST PATIENT RISK ASSESSMENT     BE USED TO DETERMINE APPROPRIATE     ANTICOAGULANT THERAPY.   No results found.  VITALS: Filed Vitals:   04/18/13 1228  BP: 122/79  Pulse: 106  Temp:  97 F (36.1 C)  Resp: 20    EXAM: General appearance: Mildly dyspneic, cannot finish sentences, oriented HEENT: no adenopathy, no carotid bruit, no JVD, supple, symmetrical, trachea midline and thyroid not enlarged, symmetric, no tenderness/mass/nodules PULM: diminished breath sounds bilaterally. No rales CV: irregularly irregular, tachycardic GI: soft, non-tender; bowel sounds normal; no masses,  no organomegaly and obese Musculoskeletal: edema trace bilaterally Pulses: 2+ and symmetric Integument: Pale, warm,d ry Neurologic: Grossly normal Psych: Appears mildly anxious  IMPRESSION: 1. Principal Problem: 2.   Atrial fibrillation with RVR 3. Active Problems: 4.   ASHD (arteriosclerotic heart disease) 5.   Hypertension associated with diabetes 6.   Type II or unspecified type diabetes mellitus without mention of complication, not stated as uncontrolled 7.   Obesity: BMI 36.8 8.   Chronic anticoagulation 9.  Acute decompensated systolic congestive heart failure, LVEF 35%, NYHA Class IV symptoms.  PLAN: 1. Admit for rate control with diltiazem. 2. Stop amlodipine. 3. Load with IV Amiodarone for rate control., possible rhythm conversion vs.  DCCV tomorrow. NPO p MN. 4.   Hold xarelto - start IV heparin. 5.   Renal consult today - solitary kidney, ?diuresis options - does not appear markedly volume overloaded. 6.   Suspect graft failure - he will likely need repeat L/RHC once he is back in sinus.  [name], MD, [facility] Attending Cardiologist [facility]  Kresta Templeman C 04/18/2013, 1:57 PM

## 2013-04-18 NOTE — Progress Notes (Addendum)
    Subjective: Mild SOB, but no other complaints.   Objective: Vital signs in last 24 hours: Temp:  [97 F (36.1 C)] 97 F (36.1 C) (10/16 1228) Pulse Rate:  [106-113] 106 (10/16 1228) Resp:  [20] 20 (10/16 1228) BP: (122-128)/(70-79) 122/79 mmHg (10/16 1228) SpO2:  [97 %] 97 % (10/16 1228) Weight:  [246 lb 3.2 oz (111.676 kg)-250 lb 12.8 oz (113.762 kg)] 246 lb 3.2 oz (111.676 kg) (10/16 1228)    Intake/Output from previous day:   Intake/Output this shift:    Medications No current facility-administered medications for this encounter.    PE: General appearance: alert, cooperative and no distress Lungs: clear to auscultation bilaterally Heart: irregularly irregular rhythm Extremities: trace bilateral LEE Pulses: 2+ and symmetric Skin: warm and dry Neurologic: Grossly normal  Lab Results:  No results found for this basename: WBC, HGB, HCT, PLT,  in the last 72 hours BMET No results found for this basename: NA, K, CL, CO2, GLUCOSE, BUN, CREATININE, CALCIUM,  in the last 72 hours PT/INR No results found for this basename: LABPROT, INR,  in the last 72 hours Cholesterol No results found for this basename: CHOL,  in the last 72 hours Cardiac Enzymes No components found with this basename: TROPONIN,  [facility],   Studies/Results:  Assessment/Plan    Principal Problem:   Atrial fibrillation with RVR Active Problems:   ASHD (arteriosclerotic heart disease)   Hypertension associated with diabetes   Type II or unspecified type diabetes mellitus without mention of complication, not stated as uncontrolled   Obesity: BMI 36.8   Chronic anticoagulation  Plan:  Pt was directly admitted by Dr. Rennis Golden from clinic to [facility] for atrial fibrillation w/ RVR and diuretic therapy. Current HR in the 130s. BP is stable with SBP in the 120s. His only symptom is mild SOB. Will load with IV Amiodarone. If he does not spontaneously convert, will plan on DCCV in the am. Will hold Xarelto and  will initiate IV Heparin. We will consult nephrology for Lasix recommendations, as recent OP labs indicated Scr. ~3.0.  Please see Dr. Blanchie Dessert office note for full details.     LOS: 0 days    Brittainy M. Delmer Islam 04/18/2013 12:37 PM  I discussed the patient with DR. Hilty over the phone -- I agree that given his level of decompensation, admitting for rhythm control - Amiodarone with +/- DCCV tomorrow, followed by Milrinone to supplement IV Lasix diuresis.   Will need Nephrology input,but I would favor Lasix gtt.   With drop in EF, we have planned for R&LHC ~ Monday - Xarelto on hold, so bridge with IV Heparin.  Marykay Lex, MD

## 2013-04-18 NOTE — Consult Note (Signed)
[name] [name] 04/18/2013 Sabra Heck, B Requesting Physician:  Hilty  Reason for Consult:  Diuresis, AKI HPI: 26M w/ CAD s/p CABG (4V. 2005), CKD3-4, hx/o solitary kidney, morbid obesity, chronic systolic HF, OSA on CPAP admitted with dyspnea and Afib with RVR.  Pt with progressive loss of GFR over at least the past 2 years upon lab review.  Pt has had chronic exertional dyspnea and some orthopnea over past 6 months which has been mildly progressive.  She was seen by Dr. Rennis Golden in clinic just recently and found to have returned to AFib and some mild RVR and new reduced LVEF.  Given dyspnea and RVR he is admitted for cardiac evaluation.  Pt previously followed by [facility] and last seen in 1991.  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  Since then has done well.  His ARB has been present for several years and stable.  No recent medication additions.  He feels well and denies LEE, hematuria, tea colored urine, or sudsy urine.  No lower urinary symptoms.   Creat (mg/dL)  Date Value  46/03/6294 2.60*  04/02/2013 2.05*  10/09/2012 1.78*  11/29/2011 1.61*     Creatinine, Ser (mg/dL)  Date Value  28/41/3244 2.60*  10/17/2012 2.26*  11/19/2010 1.67*  11/18/2010 1.86*  ] I/Os: not available  ROS NSAIDS: no exposures IV Contrast no exposures TMP/SMX no exposures Hypotension none identified Balance of 12 systems is negative w/ exceptions as above  PMH  Past Medical History  Diagnosis Date  . Hypertension   . Obesity     with a BMI of 35  . Gout   . GERD (gastroesophageal reflux disease)   . Depression   . ASHD (arteriosclerotic heart disease)   . Diabetes mellitus   . Hyperlipidemia   . Hypertriglyceridemia     TRIGS OVER 3000 IN THE PAST; MOST RECENT TRIGS OF 306, HDL 36, VLDL 61, LDL 89  . Arrhythmia     A-FIB; ON XARELTO  . 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 History of tobacco abuse     PT QUIT 07/04/12  . OSA on CPAP   .  Cardiomegaly     PER CXR RESULTS ON 11/16/10  . CHF (congestive heart failure)   . Shortness of breath    PSH  Past Surgical History  Procedure Laterality Date  . Appendectomy  1983  . Cardioversion N/A 11/14/2012    Procedure: CARDIOVERSION;  Surgeon: [name], MD;  Location[facility] ENDOSCOPY;  Service: Cardiovascular;  Laterality: N/A;  . Coronary artery bypass graft  05/11/04    X 4; HAS A VERY SHORT AORTA, ROUND DILATED HEART AS WELL AS A STERNAL ABNORMALITY   . Cardiac catheterization  11/18/10    GRAFTS WIDELY PATENT WITH NO HIGH-GRADE CAD  . Carotids  04/03/12    CAROTID DUPLEX; RIGHT BULB/PROXIMAL ICA: MILD AMT OF FIBROUS PLAQUE SLIGHTLY ELEVATING VELOCITIES WITHIN THE PROXIMAL SEGMENT OF THE INTERNAL CAROTID  ARTERY  . Nm myoview ltd  09/17/09    EF 49%; GLOBAL LV SYSTOLIC FUNCTION MILDLY REDUCED   FH  Family History  Problem Relation Age of Onset  . Heart disease Father   . Cancer Father 70    LUNG  . Hypertension Father   . Diabetes Brother   . Stroke Maternal Grandmother 60  . Heart disease Paternal Grandmother    SH  reports that he quit smoking about 9  months ago. His smoking use included Cigarettes. He smoked 1.00 pack per day. He has never used smokeless tobacco. He reports that he does not drink alcohol or use illicit drugs. Allergies No Known Allergies Home medications Prior to Admission medications   Medication Sig Start Date End Date Taking? Authorizing Provider  acetaminophen (TYLENOL) 500 MG tablet Take 1,000 mg by mouth every 6 (six) hours as needed for pain.   Yes Historical Provider, MD  allopurinol (ZYLOPRIM) 300 MG tablet take 1 tablet by mouth once daily 09/22/12  Yes Ronnald Nian, MD  amLODipine (NORVASC) 5 MG tablet take 1 tablet by mouth once daily 09/14/12  Yes Ronnald Nian, MD  aspirin EC 81 MG tablet Take 81 mg by mouth at bedtime.   Yes Historical Provider, MD  atenolol (TENORMIN) 50 MG tablet take 1 tablet by mouth once daily 09/22/12  Yes  Ronnald Nian, MD  busPIRone (BUSPAR) 15 MG tablet take 1 tablet by mouth twice a day AT 10AM AND 5PM. 12/22/12  Yes Ronnald Nian, MD  cholecalciferol (VITAMIN D) 1000 UNITS tablet Take 1,000 Units by mouth daily.   Yes Historical Provider, MD  Choline Fenofibrate (TRILIPIX) 135 MG capsule Take 135 mg by mouth daily.   Yes Historical Provider, MD  citalopram (CELEXA) 20 MG tablet take 1 tablet by mouth once daily 09/22/12  Yes Ronnald Nian, MD  FORTESTA 10 MG/ACT (2%) GEL apply 4 PUMPS once daily to SKIN 03/16/13  Yes Ronnald Nian, MD  furosemide (LASIX) 40 MG tablet Take 1 tablet (40 mg total) by mouth daily. 04/03/13  Yes [name], MD  Lansoprazole (PREVACID PO) Take 1 capsule by mouth at bedtime.   Yes Historical Provider, MD  levalbuterol [facility] HFA) 45 MCG/ACT inhaler Inhale 1-2 puffs into the lungs every 4 (four) hours as needed for wheezing. 10/15/12  Yes Ronnald Nian, MD  metFORMIN (GLUMETZA) 500 MG (MOD) 24 hr tablet Take 1 tablet (500 mg total) by mouth daily with breakfast. 03/13/13  Yes Ronnald Nian, MD  omega-3 acid ethyl esters (LOVAZA) 1 G capsule Take 2 g by mouth 2 (two) times daily.   Yes Historical Provider, MD  Rivaroxaban (XARELTO) 15 MG TABS tablet Take 15 mg by mouth daily.   Yes Historical Provider, MD  [facility] 1000-40 MG TB24 take 1 tablet by mouth once daily 09/22/12  Yes Ronnald Nian, MD  valsartan-hydrochlorothiazide (DIOVAN-HCT) 320-12.5 MG per tablet take 1 tablet by mouth once daily 09/15/12  Yes Ronnald Nian, MD  glucose blood test strip Use as instructed(THIS IS FOR THE Letta Pate VERIO IQ ) 03/13/13   Ronnald Nian, MD  [facility] DELICA LANCETS FINE MISC 1 each by Does not apply route 2 (two) times daily. 03/13/13   Ronnald Nian, MD    Current Medications Current Facility-Administered Medications  Medication Dose Route Frequency Provider Last Rate Last Dose  . 0.9 %  sodium chloride infusion  250 mL Intravenous Continuous Brittainy Simmons, PA-C 1  mL/hr at 04/18/13 1512 250 mL at 04/18/13 1512  . [START ON 04/19/2013] allopurinol (ZYLOPRIM) tablet 300 mg  300 mg Oral Daily Brittainy Simmons, PA-C      . amiodarone (NEXTERONE) 1.8 mg/mL load via infusion 150 mg  150 mg Intravenous Once [employer], PA-C   150 mg at 04/18/13 1515   Followed by  . amiodarone (NEXTERONE PREMIX) 360 mg/200 mL dextrose IV infusion  60 mg/hr Intravenous Continuous Brittainy Simmons, PA-C 33.3  mL/hr at 04/18/13 1504 60 mg/hr at 04/18/13 1504   Followed by  . amiodarone (NEXTERONE PREMIX) 360 mg/200 mL dextrose IV infusion  30 mg/hr Intravenous Continuous Brittainy Simmons, PA-C      . aspirin EC tablet 81 mg  81 mg Oral Daily [employer] Hammons, RPH      . [START ON 04/19/2013] atenolol (TENORMIN) tablet 50 mg  50 mg Oral Daily Brittainy Simmons, PA-C      . cholecalciferol (VITAMIN D) tablet 1,000 Units  1,000 Units Oral Daily Judie Bonus Hammons, RPH      . [START ON 04/19/2013] citalopram (CELEXA) tablet 20 mg  20 mg Oral Daily Brittainy Simmons, PA-C      . [START ON 04/19/2013] heparin ADULT infusion 100 units/mL (25000 units/250 mL)  1,200 Units/hr Intravenous Continuous Herby Abraham, RPH      . hydrocortisone cream 1 % 1 application  1 application Topical TID PRN Brittainy Simmons, PA-C      . levalbuterol (XOPENEX HFA) inhaler 1-2 puff  1-2 puff Inhalation Q4H PRN Brittainy Simmons, PA-C      . pantoprazole (PROTONIX) EC tablet 20 mg  20 mg Oral Daily Brittainy Simmons, PA-C      . sodium chloride 0.9 % injection 3 mL  3 mL Intravenous Q12H Brittainy Simmons, PA-C   3 mL at 04/18/13 1517  . sodium chloride 0.9 % injection 3 mL  3 mL Intravenous PRN Robbie Lis, PA-C         CBC  Recent Labs Lab 04/18/13 1258  WBC 8.8  HGB 14.1  HCT 39.7  MCV 93.4  PLT 182   Basic Metabolic Panel  Recent Labs Lab 04/18/13 1258  NA 137  K 3.9  CL 99  CO2 24  GLUCOSE 193*  BUN 68*  CREATININE 2.60*  CALCIUM 9.3    Physical  Exam  Blood pressure 122/79, pulse 106, temperature 97 F (36.1 C), temperature source Oral, resp. rate 20, height 5\' 8"  (1.727 m), weight 111.676 kg (246 lb 3.2 oz), SpO2 97.00%. GEN: NAD, sitting in chair, nl WOB.  pleasant ENT: thick neck.  NCAT.   NECK: no appreciable at 90deg, limited exam EYES: EOMI. PERRL CV: IRIR, mild tachycardia. No rub, murmur PULM: lungs clear throughout ABD: obese, s/nt SKIN: no rashes/lesions WUJ:WJXBJ edema b/l at best in legs NEURO: nonfocal   A/P 46M admitted for recurrent AFib with RVR and new reduction in LVEF.  He has progressive CKD likely as a consequence of solitary kidney and HTN.  He appears euvolemic currently (nl lung exam, minimal LEE) but does have some dyspnea on exertion.  Cardiac evaluation likley to include cardioversion and LHC.  Given these upcoming procedures I think we should keep at least euvolemic and not agrrssively diurese the patient.    1. CKD4: Hold ARB and thiazide for now, probably will need to be restarted after acute issues.  Ok with maintenance diuretics as driven by volume status, but don't see an obvious reason right now.  Check UA for hematuria and proteinuria, UP/C to quantify.  Avoid NSAIDs.  Keep euvolemic headign into cardiac cath with minimal contrast as able.  Pt at high risk of AKI from contrast.   2. ? Of CKD-BMD: check phosphorous in AM.  Ca ok.   3. Dyspnea and AFib with RVR: per cardiology  Sabra Heck MD 04/18/2013, 3:24 PM

## 2013-04-18 NOTE — Progress Notes (Signed)
ANTICOAGULATION CONSULT NOTE - Initial Consult  Pharmacy Consult for heparin Indication: atrial fibrillation  No Known Allergies  Patient Measurements: Height: 5\' 8"  (172.7 cm) Weight: 246 lb 3.2 oz (111.676 kg) (scale c) IBW/kg (Calculated) : 68.4   Vital Signs: Temp: 97 F (36.1 C) (10/16 1228) Temp src: Oral (10/16 1228) BP: 122/79 mmHg (10/16 1228) Pulse Rate: 106 (10/16 1228)  Labs:  Recent Labs  04/18/13 1258  HGB 14.1  HCT 39.7  PLT 182  APTT 52*  HEPARINUNFRC >2.20*  CREATININE 2.60*    Estimated Creatinine Clearance: 37.5 ml/min (by C-G formula based on Cr of 2.6).   Medical History: Past Medical History  Diagnosis Date  . Hypertension   . Obesity     with a BMI of 35  . Gout   . GERD (gastroesophageal reflux disease)   . Depression   . ASHD (arteriosclerotic heart disease)   . Diabetes mellitus   . Hyperlipidemia   . Hypertriglyceridemia     TRIGS OVER 3000 IN THE PAST; MOST RECENT TRIGS OF 306, HDL 36, VLDL 61, LDL 89  . Arrhythmia     A-FIB; ON XARELTO  . 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 History of tobacco abuse     PT QUIT 07/04/12  . OSA on CPAP   . Cardiomegaly     PER CXR RESULTS ON 11/16/10  . CHF (congestive heart failure)   . Shortness of breath     Medications:  Prescriptions prior to admission  Medication Sig Dispense Refill  . acetaminophen (TYLENOL) 500 MG tablet Take 1,000 mg by mouth every 6 (six) hours as needed for pain.      Marland Kitchen allopurinol (ZYLOPRIM) 300 MG tablet take 1 tablet by mouth once daily  30 tablet  12  . amLODipine (NORVASC) 5 MG tablet take 1 tablet by mouth once daily  30 tablet  11  . aspirin EC 81 MG tablet Take 81 mg by mouth at bedtime.      Marland Kitchen atenolol (TENORMIN) 50 MG tablet take 1 tablet by mouth once daily  30 tablet  11  . busPIRone (BUSPAR) 15 MG tablet take 1 tablet by mouth twice a day AT 10AM AND 5PM.  60 tablet  5  . cholecalciferol (VITAMIN D) 1000 UNITS tablet Take  1,000 Units by mouth daily.      . Choline Fenofibrate (TRILIPIX) 135 MG capsule Take 135 mg by mouth daily.      . citalopram (CELEXA) 20 MG tablet take 1 tablet by mouth once daily  30 tablet  11  . FORTESTA 10 MG/ACT (2%) GEL apply 4 PUMPS once daily to SKIN  60 g  5  . furosemide (LASIX) 40 MG tablet Take 1 tablet (40 mg total) by mouth daily.  30 tablet  11  . Lansoprazole (PREVACID PO) Take 1 capsule by mouth at bedtime.      . levalbuterol (XOPENEX HFA) 45 MCG/ACT inhaler Inhale 1-2 puffs into the lungs every 4 (four) hours as needed for wheezing.  1 Inhaler  12  . metFORMIN (GLUMETZA) 500 MG (MOD) 24 hr tablet Take 1 tablet (500 mg total) by mouth daily with breakfast.  60 tablet  5  . omega-3 acid ethyl esters (LOVAZA) 1 G capsule Take 2 g by mouth 2 (two) times daily.      . Rivaroxaban (XARELTO) 15 MG TABS tablet Take 15 mg by mouth daily.      Marland Kitchen  SIMCOR 1000-40 MG TB24 take 1 tablet by mouth once daily  30 tablet  11  . valsartan-hydrochlorothiazide (DIOVAN-HCT) 320-12.5 MG per tablet take 1 tablet by mouth once daily  30 tablet  11  . glucose blood test strip Use as instructed(THIS IS FOR THE ONETOUCH VERIO IQ )  100 each  PRN  . ONETOUCH DELICA LANCETS FINE MISC 1 each by Does not apply route 2 (two) times daily.  100 each  PRN    Assessment: 57 yo WF admitted from Dr. Blanchie Dessert office with afib w/ RVR to start heparin drip per pharmacy.  He took his xarelto 15 mg this morning at 8am (should be taking with a large meal).  His baseline HL is > 2.2 due to interference with xarelto on the lab value.  His baseline aPTT is 52 seconds.  His creat is 2.6.  H/H 14.1/39.7, pltc 182.  Cr 2.6 with creat cl ~ 37 ml/min and wt = 111.7 kg.  Goal of Therapy:  APTT 66-102 Monitor platelets by anticoagulation protocol: Yes   Plan:  1. Start heparin drip 24 hours after last dose of xarelto with no bolus 2. Start heparin drip 10/17 at 8am at rate of 1200 units/hr 3. Check aPTT and HL 6 hrs after  drip started tomorrow at 1400 3. Daily HL and aPTT and CBC while on heparin 4. HL will be elevated b/c of xarelto so will need to f/u aPTTs until xarelto effect gone 5. Metformin stopped b/c creat > 1.5. Consider permanent DC of meftormin  Herby Abraham, Pharm.D. [phone] 04/18/2013 2:14 PM

## 2013-04-18 NOTE — Progress Notes (Signed)
Pt up to the chair, denies any chest pain or SOB at this time. Renal US completed as ordered pending results at this time. Pt continues on Amiodarone gtt as ordered decreased to 16.7 mL/hr as ordered by MD. Pt continues on Afib at this time. We'll continue to Monitor.

## 2013-04-18 NOTE — H&P (Signed)
ADMISSION HISTORY & PHYSICAL   Chief Complaint:  Dyspnea, fatigue, cough  Cardiologist: Rilee Knoll  Primary Care Physician: Carollee Herter, MD  HPI:  This is a 57 y.o. male with a past medical history significant for coronary artery disease and bypass surgery x 3 vessels in 2005 (LIMA to LAD, SVG to DIAG, and SVG to OM1/OM2). He also has hyperlipidemia and hypertriglyceridemia with triglycerides of over 3000 in the past, morbid obesity, OSA on CPAP and recently atrial fibrillation. Most recent labs showed triglycerides of 306, HDL of 36, VLDL 61 and LDL of 89. He had an outpatient cardiac catheterization on Nov 18, 2010 that showed the grafts were widely patent. Because of a solitary kidney he did not have an LVgram. His last creatinine October 11, 2012 was 1.58. The BUN was 31. The patient was seen on October 10, 2012 and was found to be in atrial fibrillation. He was scheduled for repeat cardioversion and had been placed on Xarelto at that time. Dr. Rennis Golden did not feel it was safe without general anesthesia to do the TEE due to his history of obstructive sleep apnea and relatively thick neck. The patient also had interrupted his Xarelto for 1 day due to gingival bleeding but he had restarted it on Monday, April 14, and has been taking it ever since without any recurrent bleeding. He did undergo successful cardioversion to sinus rhythm and reported that he felt better. Subsequently he had an echocardiogram which showed a newly reduced ejection fraction of 35%. This is in the setting of worsening shortness of breath. I recently started him on low-dose Lasix however his creatinine is rising and is now over 3. He has never seen a nephrologist. As above, he is known to have a solitary kidney. Today he returns and reports markedly increased shortness of breath without much improvement with diuresis. It is noted that he is in atrial fibrillation with rapid ventricular response. I suspect either this or  coronary bypass graft dysfunction may be contributing to his heart failure.   PMHx:  Past Medical History  Diagnosis Date  . Hypertension   . Obesity     with a BMI of 35  . Gout   . GERD (gastroesophageal reflux disease)   . Depression   . ASHD (arteriosclerotic heart disease)   . Diabetes mellitus   . Hyperlipidemia   . Hypertriglyceridemia     TRIGS OVER 3000 IN THE PAST; MOST RECENT TRIGS OF 306, HDL 36, VLDL 61, LDL 89  . Arrhythmia     A-FIB; ON XARELTO  . 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 History of tobacco abuse     PT QUIT 07/04/12  . OSA on CPAP   . Cardiomegaly     PER CXR RESULTS ON 11/16/10  . CHF (congestive heart failure)   . Shortness of breath     Past Surgical History  Procedure Laterality Date  . Appendectomy  1983  . Cardioversion N/A 11/14/2012    Procedure: CARDIOVERSION;  Surgeon: [name], MD;  Location: [facility];  Service: Cardiovascular;  Laterality: N/A;  . Coronary artery bypass graft  05/11/04    X 4; HAS A VERY SHORT AORTA, ROUND DILATED HEART AS WELL AS A STERNAL ABNORMALITY   . Cardiac catheterization  11/18/10    GRAFTS WIDELY PATENT WITH NO HIGH-GRADE CAD  . Carotids  04/03/12    CAROTID DUPLEX; RIGHT BULB/PROXIMAL ICA: MILD AMT OF FIBROUS PLAQUE  SLIGHTLY ELEVATING VELOCITIES WITHIN THE PROXIMAL SEGMENT OF THE INTERNAL CAROTID  ARTERY  . Nm myoview ltd  09/17/09    EF 49%; GLOBAL LV SYSTOLIC FUNCTION MILDLY REDUCED    FAMHx:  Family History  Problem Relation Age of Onset  . Heart disease Father   . Cancer Father 85    LUNG  . Hypertension Father   . Diabetes Brother   . Stroke Maternal Grandmother 60  . Heart disease Paternal Grandmother     SOCHx:   reports that he quit smoking about 9 months ago. His smoking use included Cigarettes. He smoked 1.00 pack per day. He has never used smokeless tobacco. He reports that he does not drink alcohol or use illicit drugs.  ALLERGIES:  No Known  Allergies  ROS: A comprehensive review of systems was negative except for: Constitutional: positive for fatigue and weight gain Respiratory: positive for cough and dyspnea on exertion Cardiovascular: positive for dyspnea and irregular heart beat  HOME MEDS: Medications Prior to Admission  Medication Sig Dispense Refill  . acetaminophen (TYLENOL) 500 MG tablet Take 1,000 mg by mouth every 6 (six) hours as needed for pain.      Marland Kitchen allopurinol (ZYLOPRIM) 300 MG tablet take 1 tablet by mouth once daily  30 tablet  12  . amLODipine (NORVASC) 5 MG tablet take 1 tablet by mouth once daily  30 tablet  11  . aspirin EC 81 MG tablet Take 81 mg by mouth at bedtime.      Marland Kitchen atenolol (TENORMIN) 50 MG tablet take 1 tablet by mouth once daily  30 tablet  11  . busPIRone (BUSPAR) 15 MG tablet take 1 tablet by mouth twice a day AT 10AM AND 5PM.  60 tablet  5  . cholecalciferol (VITAMIN D) 1000 UNITS tablet Take 1,000 Units by mouth daily.      . Choline Fenofibrate (TRILIPIX) 135 MG capsule Take 135 mg by mouth daily.      . citalopram (CELEXA) 20 MG tablet take 1 tablet by mouth once daily  30 tablet  11  . FORTESTA 10 MG/ACT (2%) GEL apply 4 PUMPS once daily to SKIN  60 g  5  . furosemide (LASIX) 40 MG tablet Take 1 tablet (40 mg total) by mouth daily.  30 tablet  11  . Lansoprazole (PREVACID PO) Take 1 capsule by mouth at bedtime.      . levalbuterol (XOPENEX HFA) 45 MCG/ACT inhaler Inhale 1-2 puffs into the lungs every 4 (four) hours as needed for wheezing.  1 Inhaler  12  . metFORMIN (GLUMETZA) 500 MG (MOD) 24 hr tablet Take 1 tablet (500 mg total) by mouth daily with breakfast.  60 tablet  5  . omega-3 acid ethyl esters (LOVAZA) 1 G capsule Take 2 g by mouth 2 (two) times daily.      . Rivaroxaban (XARELTO) 15 MG TABS tablet Take 15 mg by mouth daily.      Marland Kitchen SIMCOR 1000-40 MG TB24 take 1 tablet by mouth once daily  30 tablet  11  . valsartan-hydrochlorothiazide (DIOVAN-HCT) 320-12.5 MG per tablet take  1 tablet by mouth once daily  30 tablet  11  . glucose blood test strip Use as instructed(THIS IS FOR THE ONETOUCH VERIO IQ )  100 each  PRN  . ONETOUCH DELICA LANCETS FINE MISC 1 each by Does not apply route 2 (two) times daily.  100 each  PRN    LABS/IMAGING: Results for orders placed during the  hospital encounter of 04/18/13 (from the past 48 hour(s))  GLUCOSE, CAPILLARY     Status: Abnormal   Collection Time    04/18/13 12:12 PM      Result Value Range   Glucose-Capillary 212 (*) 70 - 99 mg/dL  CBC     Status: None   Collection Time    04/18/13 12:58 PM      Result Value Range   WBC 8.8  4.0 - 10.5 K/uL   RBC 4.25  4.22 - 5.81 MIL/uL   Hemoglobin 14.1  13.0 - 17.0 g/dL   HCT 16.1  09.6 - 04.5 %   MCV 93.4  78.0 - 100.0 fL   MCH 33.2  26.0 - 34.0 pg   MCHC 35.5  30.0 - 36.0 g/dL   RDW 40.9  81.1 - 91.4 %   Platelets 182  150 - 400 K/uL  BASIC METABOLIC PANEL     Status: Abnormal   Collection Time    04/18/13 12:58 PM      Result Value Range   Sodium 137  135 - 145 mEq/L   Potassium 3.9  3.5 - 5.1 mEq/L   Chloride 99  96 - 112 mEq/L   CO2 24  19 - 32 mEq/L   Glucose, Bld 193 (*) 70 - 99 mg/dL   BUN 68 (*) 6 - 23 mg/dL   Creatinine, Ser 7.82 (*) 0.50 - 1.35 mg/dL   Calcium 9.3  8.4 - 95.6 mg/dL   GFR calc non Af Amer 26 (*) >90 mL/min   GFR calc Af Amer 30 (*) >90 mL/min   Comment: (NOTE)     The eGFR has been calculated using the CKD EPI equation.     This calculation has not been validated in all clinical situations.     eGFR's persistently <90 mL/min signify possible Chronic Kidney     Disease.  APTT     Status: Abnormal   Collection Time    04/18/13 12:58 PM      Result Value Range   aPTT 52 (*) 24 - 37 seconds   Comment:            IF BASELINE aPTT IS ELEVATED,     SUGGEST PATIENT RISK ASSESSMENT     BE USED TO DETERMINE APPROPRIATE     ANTICOAGULANT THERAPY.   No results found.  VITALS: Filed Vitals:   04/18/13 1228  BP: 122/79  Pulse: 106  Temp:  97 F (36.1 C)  Resp: 20    EXAM: General appearance: Mildly dyspneic, cannot finish sentences, oriented Neck: no adenopathy, no carotid bruit, no JVD, supple, symmetrical, trachea midline and thyroid not enlarged, symmetric, no tenderness/mass/nodules Lungs: diminished breath sounds bilaterally Heart: irregularly irregular, tachycardic Abdomen: soft, non-tender; bowel sounds normal; no masses,  no organomegaly and obese Extremities: edema trace bilaterally Pulses: 2+ and symmetric Skin: Pale, warm,d ry Neurologic: Grossly normal Psych: Appears mildly anxious  IMPRESSION: 1. Principal Problem: 2.   Atrial fibrillation with RVR 3. Active Problems: 4.   ASHD (arteriosclerotic heart disease) 5.   Hypertension associated with diabetes 6.   Type II or unspecified type diabetes mellitus without mention of complication, not stated as uncontrolled 7.   Obesity: BMI 36.8 8.   Chronic anticoagulation 9.  Acute systolic congestive heart failure, LVEF 35%, NYHA Class IV symptoms.  PLAN: 1. Admit for rate control with diltiazem. 2. Stop amlodipine. 3. Load with IV Amiodarone for rate control., possible rhythm conversion vs. DCCV tomorrow. NPO  p MN. 4.   Hold xarelto - start IV heparin. 5.   Renal consult today - solitary kidney, ?diuresis options - does not appear markedly volume overloaded. 6.   Suspect graft failure - he will likely need repeat L/RHC once he is back in sinus.  [name], MD, [facility] Attending Cardiologist [facility]  Klay Sobotka C 04/18/2013, 1:57 PM

## 2013-04-18 NOTE — Progress Notes (Signed)
Utilization Review Completed.   Jojo Geving, RN, BSN Nurse Case Manager  [phone]  

## 2013-04-18 NOTE — Patient Instructions (Signed)
Please go to the admission department at [facility].

## 2013-04-19 ENCOUNTER — Encounter (HOSPITAL_COMMUNITY): Payer: Self-pay | Admitting: Critical Care Medicine

## 2013-04-19 ENCOUNTER — Encounter (HOSPITAL_COMMUNITY): Payer: Managed Care, Other (non HMO) | Admitting: Critical Care Medicine

## 2013-04-19 ENCOUNTER — Encounter: Payer: Self-pay | Admitting: Internal Medicine

## 2013-04-19 ENCOUNTER — Encounter (HOSPITAL_COMMUNITY)
Admission: AD | Disposition: A | Discharge status: Home / Self Care | Payer: Self-pay | Source: Ambulatory Visit | Attending: Internal Medicine

## 2013-04-19 ENCOUNTER — Inpatient Hospital Stay (HOSPITAL_COMMUNITY): Payer: Managed Care, Other (non HMO) | Admitting: Critical Care Medicine

## 2013-04-19 DIAGNOSIS — E1169 Type 2 diabetes mellitus with other specified complication: Secondary | ICD-10-CM

## 2013-04-19 DIAGNOSIS — I10 Essential (primary) hypertension: Secondary | ICD-10-CM

## 2013-04-19 LAB — BASIC METABOLIC PANEL
BUN: 68 mg/dL — ABNORMAL HIGH (ref 6–23)
CO2: 23 mEq/L (ref 19–32)
Chloride: 98 mEq/L (ref 96–112)
Creatinine, Ser: 2.83 mg/dL — ABNORMAL HIGH (ref 0.50–1.35)
Potassium: 3.6 mEq/L (ref 3.5–5.1)

## 2013-04-19 LAB — APTT: aPTT: 37 seconds (ref 24–37)

## 2013-04-19 LAB — HEPARIN LEVEL (UNFRACTIONATED)
Heparin Unfractionated: 1.04 IU/mL — ABNORMAL HIGH (ref 0.30–0.70)
Heparin Unfractionated: 0.38 IU/mL (ref 0.30–0.70)

## 2013-04-19 LAB — GLUCOSE, CAPILLARY
Glucose-Capillary: 159 mg/dL — ABNORMAL HIGH (ref 70–99)
Glucose-Capillary: 175 mg/dL — ABNORMAL HIGH (ref 70–99)
Glucose-Capillary: 158 mg/dL — ABNORMAL HIGH (ref 70–99)

## 2013-04-19 LAB — CBC
HCT: 43.2 % (ref 39.0–52.0)
Hemoglobin: 15.3 g/dL (ref 13.0–17.0)
MCHC: 35.4 g/dL (ref 30.0–36.0)
MCV: 93.5 fL (ref 78.0–100.0)
RDW: 14.4 % (ref 11.5–15.5)
WBC: 9.1 10*3/uL (ref 4.0–10.5)

## 2013-04-19 LAB — PHOSPHORUS: Phosphorus: 4.5 mg/dL (ref 2.3–4.6)

## 2013-04-19 SURGERY — CARDIOVERSION
Anesthesia: Monitor Anesthesia Care

## 2013-04-19 MED ORDER — ZOLPIDEM TARTRATE 5 MG PO TABS
5.0000 mg | ORAL_TABLET | Freq: Once | ORAL | Status: AC
  Administered 2013-04-19: 23:00:00 5 mg via ORAL
  Filled 2013-04-19: qty 1

## 2013-04-19 MED ORDER — LIDOCAINE HCL (CARDIAC) 20 MG/ML IV SOLN
INTRAVENOUS | Status: DC | PRN
  Administered 2013-04-19: 100 mg via INTRAVENOUS

## 2013-04-19 MED ORDER — PROPOFOL 10 MG/ML IV BOLUS
INTRAVENOUS | Status: DC | PRN
  Administered 2013-04-19: 100 mg via INTRAVENOUS

## 2013-04-19 NOTE — Anesthesia Postprocedure Evaluation (Signed)
  Anesthesia Post-op Note  Patient: [name] [name]  Procedure(s) Performed: * No procedures listed *  Patient Location: Nursing Unit  Anesthesia Type:General  Level of Consciousness: awake, alert  and oriented  Airway and Oxygen Therapy: Patient Spontanous Breathing and Patient connected to nasal cannula oxygen  Post-op Pain: none  Post-op Assessment: Post-op Vital signs reviewed, Patient's Cardiovascular Status Stable, Respiratory Function Stable, Patent Airway and No signs of Nausea or vomiting  Post-op Vital Signs: Reviewed and stable  Complications: No apparent anesthesia complications

## 2013-04-19 NOTE — CV Procedure (Signed)
Procedure: Electrical Cardioversion Indications:  Atrial Fibrillation  Procedure Details:  Consent: Risks of procedure as well as the alternatives and risks of each were explained to the (patient/caregiver).  Consent for procedure obtained.  Time Out: Verified patient identification, verified procedure, site/side was marked, verified correct patient position, special equipment/implants available, medications/allergies/relevent history reviewed, required imaging and test results available.  Performed  Patient placed on cardiac monitor, pulse oximetry, supplemental oxygen as necessary.  Sedation given: propofol 100 mg IV, Dr. Krista Blue Pacer pads placed anterior and posterior chest.  Cardioverted 2 time(s).  Cardioverted at 150J, synchronized biphasic unsuccessful. Second synchronized shock at 200J converted to NSR 70 bpm.  Evaluation: Findings: Post procedure EKG shows: NSR Complications: None Patient did tolerate procedure well.  Time Spent Directly with the Patient:  30 minutes   [name] [name] 04/19/2013, 2:19 PM

## 2013-04-19 NOTE — Progress Notes (Signed)
ANTICOAGULATION CONSULT NOTE - Follow Up Consult  Pharmacy Consult for heparin Indication: atrial fibrillation  No Known Allergies  Patient Measurements: Height: 5\' 8"  (172.7 cm) Weight: 246 lb 3.2 oz (111.676 kg) (scale c) IBW/kg (Calculated) : 68.4 Heparin Dosing Weight: 93.35  Vital Signs: BP: 94/56 mmHg (10/17 1515) Pulse Rate: 66 (10/17 1515)  Labs:  Recent Labs  04/18/13 1258 04/19/13 0405 04/19/13 1533  HGB 14.1 15.3  --   HCT 39.7 43.2  --   PLT 182 172  --   APTT 52* 40* 37  HEPARINUNFRC >2.20* 1.04* 0.38  CREATININE 2.60* 2.83*  --     Estimated Creatinine Clearance: 34.5 ml/min (by C-G formula based on Cr of 2.83).   Medications:  Scheduled:  . allopurinol  300 mg Oral Daily  . aspirin EC  81 mg Oral Daily  . atenolol  50 mg Oral Daily  . cholecalciferol  1,000 Units Oral Daily  . citalopram  20 mg Oral Daily  . pantoprazole  20 mg Oral Daily  . sodium chloride  3 mL Intravenous Q12H    Assessment: 57 yo WF admitted from Dr. Blanchie Dessert office with afib w/ RVR to start heparin drip per pharmacy. Patient was cardioverted 2x on 10/17 at ~1400. Per cardiology since patients Cr continues to trend up, may transition patient to warfarin instead of Xarelto.  Currently APTT returned subtherapeutic and anti-Xa levels have come down to goal range of 0.3-0.7. Xarelto's half life is around 5-9 hours, and patient last received a dose at 8 am 10/16. Patient has likely cleared majority of Xarelto, and heparin is likely contributing more to the current levels.   Goal of Therapy:  aPTT 66-102 seconds Heparin level 0.3-0.7 Monitor platelets by anticoagulation protocol: Yes   Plan:  Continue Heparin drip at 1200 units/hr Check aPTT and HL in 6 hours at 0000 Daily HL and aPTT and CBC while on heparin  Forestine Na M 04/19/2013,5:39 PM

## 2013-04-19 NOTE — Progress Notes (Signed)
1410 Cardioversion started  At bedside  With Dr. Burgess Estelle . Anesthesiologist , CCRNA s  In attendance . Pt afib on MVR @ 80's . Pt converted to SR 60's with 200 joules , 150 joules given beforehand but  stayed Afib.Pt  given concious sedation by anesthesia . during conversion process. Tolerated the procedure well without incedent Woke up and remained awake . Vs monitored .  1500 Ate well for lunch with good swallowing reflexes

## 2013-04-19 NOTE — Progress Notes (Signed)
Pt resting on bed comfortable with CPAP, VSS, continues SR on monitor, continues with Amiodarone IV gtt and Heparin gtt as ordered. Order for Ambien 5 mg PO gotten per patient request and given. We'll continue with POC.

## 2013-04-19 NOTE — Progress Notes (Addendum)
Subjective: pt sitting up in the chair Not dyspneic unless moves around For cardioversion at [facility] today  Objective Vital signs in last 24 hours: Filed Vitals:   04/18/13 1228 04/19/13 0800 04/19/13 1046  BP: 122/79 110/60 108/59  Pulse: 106  88  Temp: 97 F (36.1 C)    TempSrc: Oral    Resp: 20    Height: 5\' 8"  (1.727 m)    Weight: 111.676 kg (246 lb 3.2 oz)    SpO2: 97%     Weight change:   Intake/Output Summary (Last 24 hours) at 04/19/13 1235 Last data filed at 04/19/13 1100  Gross per 24 hour  Intake    460 ml  Output   1125 ml  Net   -665 ml   Physical Exam:  Blood pressure 108/59, pulse 88, temperature 97 F (36.1 C), temperature source Oral, resp. rate 20, height 5\' 8"  (1.727 m), weight 111.676 kg (246 lb 3.2 oz), SpO2 97.00%. Large neck cannot visualize neck veins Lungs clear Irreg S1S2 no S3 No murmur heard Obese NT abd Woody changes bilat LE's  Labs: Basic Metabolic Panel:  Recent Labs Lab 04/18/13 1258 04/19/13 0405  NA 137 137  K 3.9 3.6  CL 99 98  CO2 24 23  GLUCOSE 193* 144*  BUN 68* 68*  CREATININE 2.60* 2.83*  CALCIUM 9.3 9.1  PHOS  --  4.5   Recent Labs Lab 04/18/13 1258 04/19/13 0405  WBC 8.8 9.1  HGB 14.1 15.3  HCT 39.7 43.2  MCV 93.4 93.5  PLT 182 172    Recent Labs Lab 04/18/13 1212 04/18/13 2147 04/19/13 0601 04/19/13 1125  GLUCAP 212* 178* 159* 175*   Results for [name] [name], [name] [name] (MRN [id number]) as of 04/19/2013 12:41  Ref. Range 04/18/2013 17:10  Color, Urine Latest Range: YELLOW  YELLOW  APPearance Latest Range: CLEAR  CLEAR  Specific Gravity, Urine Latest Range: 1.005-1.030  1.013  pH Latest Range: 5.0-8.0  5.0  Glucose Latest Range: NEGATIVE mg/dL NEGATIVE  Bilirubin Urine Latest Range: NEGATIVE  NEGATIVE  Ketones, ur Latest Range: NEGATIVE mg/dL NEGATIVE  Protein Latest Range: NEGATIVE mg/dL >409 (A)  Urobilinogen, UA Latest Range: 0.0-1.0 mg/dL 0.2  Nitrite Latest Range: NEGATIVE  NEGATIVE  Leukocytes, UA  Latest Range: NEGATIVE  NEGATIVE  Hgb urine dipstick Latest Range: NEGATIVE  TRACE (A)  Urine-Other No range found MUCOUS PRESENT  WBC, UA Latest Range: <3 WBC/hpf 0-2  RBC / HPF Latest Range: <3 RBC/hpf 0-2  Squamous Epithelial / LPF Latest Range: RARE  RARE  Bacteria, UA Latest Range: RARE  RARE  Results for [name] [name], [name] [name] (MRN [id number]) as of 04/19/2013 12:41  Ref. Range 04/18/2013 17:10  PROTEIN CREATININE RATIO Latest Range: 0.00-0.15  3.04 (H)   Studies/Results: US Renal  04/19/2013   CLINICAL DATA:  Elevated creatinine. Solitary kidney.  EXAM: RENAL/URINARY TRACT ULTRASOUND COMPLETE  COMPARISON:  None.  FINDINGS: Right Kidney  Length: 14.6 cm. Increased echogenicity. Multiple (15-20) cysts, with the largest measuring 2.6 x 2.7 x 2.5 cm. No hydronephrosis. No visible calculi.  Left Kidney  Length: Congenitally absent.  Bladder  Appears normal for degree of bladder distention.  IMPRESSION: Solitary right kidney with increased echogenicity suggesting medical renal disease. Multiple cysts as described. No hydronephrosis.   Electronically Signed   By: Davonna Belling M.D.   On: 04/19/2013 00:53   Medications: . sodium chloride 250 mL (04/18/13 1512)  . amiodarone (NEXTERONE PREMIX) 360 mg/200 mL dextrose 30 mg/hr (04/19/13 0435)  .  heparin 1,200 Units/hr (04/19/13 0830)   . allopurinol  300 mg Oral Daily  . aspirin EC  81 mg Oral Daily  . atenolol  50 mg Oral Daily  . cholecalciferol  1,000 Units Oral Daily  . citalopram  20 mg Oral Daily  . pantoprazole  20 mg Oral Daily  . sodium chloride  3 mL Intravenous Q12H    I  have reviewed scheduled and prn medications.  ASSESSMENT/RECOMMENDATIONS  5M w/ CAD s/p CABG (4V. 2005), CKD4, hx/o solitary kidney, morbid obesity, chronic systolic HF, OSA on CPAP admitted with dyspnea and Afib with RVR, admitted with recurrent afib and newly reduced EF. Progressive loss of GFR over at least the past 2 years likely as a consequence of HTN in a  solitary kidney.   Because of elevated creatinine of 2.6 (last baseline 2.03 October 2012)  renal was consulted.  1. 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  Hold ARB and thiazide for now, probably will need to be restarted after acute issues. Ok with maintenance diuretics as driven by volume status, but don't see an obvious reason right now. Has sig proteinuria based on UPC but otherwise bland UA.  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 . Avoid NSAIDs. Keep euvolemic headign into cardiac cath with minimal contrast as able. Pt at high risk of AKI from contrast.  2. CKD-MBD - check PTH; Ca,phos both OK 3. Dyspnea and AFib with RVR: per cardiology - for cardioversion today, possible cath on Monday   Camille Bal, MD [facility] [phone] pager 04/19/2013, 12:35 PM

## 2013-04-19 NOTE — Progress Notes (Signed)
Subjective: No complaints. He denies SOB, CP and palpitations.   Objective: Vital signs in last 24 hours: Temp:  [97 F (36.1 C)] 97 F (36.1 C) (10/16 1228) Pulse Rate:  [106-113] 106 (10/16 1228) Resp:  [20] 20 (10/16 1228) BP: (122-128)/(70-79) 122/79 mmHg (10/16 1228) SpO2:  [97 %] 97 % (10/16 1228) Weight:  [246 lb 3.2 oz (111.676 kg)-250 lb 12.8 oz (113.762 kg)] 246 lb 3.2 oz (111.676 kg) (10/16 1228) Last BM Date: 04/17/13  Intake/Output from previous day: 10/16 0701 - 10/17 0700 In: 460 [P.O.:460] Out: 925 [Urine:925] Intake/Output this shift:    Medications Current Facility-Administered Medications  Medication Dose Route Frequency Provider Last Rate Last Dose  . 0.9 %  sodium chloride infusion  250 mL Intravenous Continuous Brittainy Simmons, PA-C 1 mL/hr at 04/18/13 1512 250 mL at 04/18/13 1512  . allopurinol (ZYLOPRIM) tablet 300 mg  300 mg Oral Daily Brittainy Simmons, PA-C      . amiodarone (NEXTERONE PREMIX) 360 mg/200 mL dextrose IV infusion  30 mg/hr Intravenous Continuous [name], MD 16.7 mL/hr at 04/19/13 0435 30 mg/hr at 04/19/13 0435  . aspirin EC tablet 81 mg  81 mg Oral Daily Judie Bonus Hammons, RPH   81 mg at 04/18/13 1742  . atenolol (TENORMIN) tablet 50 mg  50 mg Oral Daily Brittainy Simmons, PA-C      . cholecalciferol (VITAMIN D) tablet 1,000 Units  1,000 Units Oral Daily Judie Bonus Hammons, RPH   1,000 Units at 04/18/13 1741  . citalopram (CELEXA) tablet 20 mg  20 mg Oral Daily Brittainy Simmons, PA-C      . heparin ADULT infusion 100 units/mL (25000 units/250 mL)  1,200 Units/hr Intravenous Continuous Herby Abraham, RPH 12 mL/hr at 04/19/13 0830 1,200 Units/hr at 04/19/13 0830  . hydrocortisone cream 1 % 1 application  1 application Topical TID PRN Brittainy Simmons, PA-C      . levalbuterol (XOPENEX HFA) inhaler 1-2 puff  1-2 puff Inhalation Q4H PRN Brittainy Simmons, PA-C      . pantoprazole (PROTONIX) EC tablet 20 mg  20 mg  Oral Daily Brittainy Simmons, PA-C   20 mg at 04/18/13 1741  . sodium chloride 0.9 % injection 3 mL  3 mL Intravenous Q12H Brittainy Simmons, PA-C   3 mL at 04/18/13 2126  . sodium chloride 0.9 % injection 3 mL  3 mL Intravenous PRN Robbie Lis, PA-C        PE: General appearance: alert, cooperative, no distress and moderately obese Lungs: clear to auscultation bilaterally Heart: irregularly irregular rhythm Extremities: no LEE Pulses: 2+ and symmetric Skin: warm and dry Neurologic: Grossly normal  Lab Results:   Recent Labs  04/18/13 1258 04/19/13 0405  WBC 8.8 9.1  HGB 14.1 15.3  HCT 39.7 43.2  PLT 182 172   BMET  Recent Labs  04/18/13 1258 04/19/13 0405  NA 137 137  K 3.9 3.6  CL 99 98  CO2 24 23  GLUCOSE 193* 144*  BUN 68* 68*  CREATININE 2.60* 2.83*  CALCIUM 9.3 9.1     Assessment/Plan  Principal Problem:   Atrial fibrillation with RVR Active Problems:   ASHD (arteriosclerotic heart disease)   Hypertension associated with diabetes   Mosaicism, 45, 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 Ventricular rate is controlled on IV Amiodarone. HR in the 80s. BP is stable. He is asymptomatic. If he does not spontaenously convert back to NSR, plan for elective DCCV with Dr. Herbie Baltimore today at 2:00 pm. Continue to keep NPO. Continue IV heparin. Pt was on Xarelto prior to admission. With current renal function (SCr of 2.83) ? If warfarin would be a better option. Nephrology is following. Recs are to avoid ACE/ARB and NSAIDs. They see very little indication for aggressive diuresis at this point. Will continue to hold PO lasix.  Nephrology to follow-up with labs and studies today. We appreciate  their input.    LOS: 1 day    Brittainy M. Delmer Islam 04/19/2013 8:55 AM  I have seen and evaluated the patient this AM along with Boyce Medici, PA. I agree with her findings, examination as well as impression recommendations.  Remains in Afib, butrate is somewhat controlled.  No acute respiratory distress & I agree that he has no obvious sign of volume overload (but you simply cannot see JVP with his body habitus).  RHC would be helpful to clarify volume status.  Esp with EF ~30% by recent evaluation.  Dr. Rennis Golden feels that we also need to delineate his coronary/graft anatomy -- will need to follow Cr trend.  Holding diuretic & ARB for now.    Plan for today is DCCV as he has not chemically cardioverted. - remains on Heparin now that Xarelto has cleared. I agree that with current renal trend, we may need to convert anticoagulation to warfarin from Xarelto.   Appreciate Renal Consult & rec.   MD Time with pt: 15 min  Paislea Hatton,Nolen W, M.D., M.S. [facility] [address] [place], [place]  [address]  [phone] Pager # [phone] 04/19/2013 11:17 AM

## 2013-04-19 NOTE — Preoperative (Signed)
Beta Blockers   Reason not to administer Beta Blockers:Not Applicable, pt took atenolol 04/19/13

## 2013-04-19 NOTE — Anesthesia Preprocedure Evaluation (Addendum)
Anesthesia Evaluation  Patient identified by MRN, date of birth, ID band Patient awake    Reviewed: Allergy & Precautions, H&P , NPO status , Patient's Chart, lab work & pertinent test results, reviewed documented beta blocker date and time   Airway Mallampati: III TM Distance: >3 FB Neck ROM: Full    Dental   Pulmonary shortness of breath, sleep apnea and Continuous Positive Airway Pressure Ventilation ,  breath sounds clear to auscultation        Cardiovascular hypertension, Pt. on home beta blockers + CAD, + CABG and +CHF + dysrhythmias Atrial Fibrillation Rhythm:Irregular Rate:Tachycardia  9/14 - Echo Study Conclusions  - Left ventricle: The cavity size was moderately dilated.   Normal wall thickness. There is eccentric hypertrophy. RWT is 0.3 g/m2 - consistent with normal geometry. Global hypokinesis with regional variation - incoordinate septal motion. The estimated ejection fraction was 30-40%, with beat to beat variation. Cannot estimate diastolic function due to atrial fibrillation. The E/e' ratio is >10,suggesting elevated LV Filling pressure. The MV decel time is sharp at 113 msec, suggesting diastolic dysfunction which must be present, given the reduced ejection fraction. - Mitral valve: Mild to moderate regurgitation. - Left atrium: LA Volume / BSA = 27.1 ml/m2 The atrium was at the upper limits of normal in size. - Right ventricle: The cavity size was mildly dilated.   Dilated annulus. - Tricuspid valve: Dilated tricuspid annulus. Moderate   regurgitation. - Pulmonary arteries: PA peak pressure: 41mm Hg (S). - Inferior vena cava: The vessel was normal in size; the   respirophasic diameter changes were in the normal range (=   50%); findings are consistent with normal central venous   pressure.    Neuro/Psych PSYCHIATRIC DISORDERS Depression    GI/Hepatic GERD-  ,  Endo/Other  diabetes  Renal/GU CRFRenal disease      Musculoskeletal   Abdominal   Peds  Hematology   Anesthesia Other Findings   Reproductive/Obstetrics                         Anesthesia Physical Anesthesia Plan  ASA: III  Anesthesia Plan: General   Post-op Pain Management:    Induction: Intravenous  Airway Management Planned: Mask  Additional Equipment:   Intra-op Plan:   Post-operative Plan:   Informed Consent: I have reviewed the patients History and Physical, chart, labs and discussed the procedure including the risks, benefits and alternatives for the proposed anesthesia with the patient or authorized representative who has indicated his/her understanding and acceptance.   Dental advisory given  Plan Discussed with: Anesthesiologist, Surgeon and CRNA  Anesthesia Plan Comments:        Anesthesia Quick Evaluation

## 2013-04-19 NOTE — Transfer of Care (Signed)
Immediate Anesthesia Transfer of Care Note  Patient: [name] [name]  Procedure(s) Performed: * No procedures listed *  Patient Location: Nursing Unit  Anesthesia Type:General  Level of Consciousness: awake, alert  and oriented  Airway & Oxygen Therapy: Patient Spontanous Breathing and Patient connected to nasal cannula oxygen  Post-op Assessment: Report given to PACU RN, Post -op Vital signs reviewed and stable and Patient moving all extremities X 4  Post vital signs: Reviewed and stable  Complications: No apparent anesthesia complications

## 2013-04-20 LAB — CBC
HCT: 39.2 % (ref 39.0–52.0)
Hemoglobin: 14 g/dL (ref 13.0–17.0)
MCHC: 35.7 g/dL (ref 30.0–36.0)
MCV: 93.3 fL (ref 78.0–100.0)
RDW: 14.5 % (ref 11.5–15.5)
WBC: 6.1 10*3/uL (ref 4.0–10.5)

## 2013-04-20 LAB — RENAL FUNCTION PANEL
CO2: 24 mEq/L (ref 19–32)
Chloride: 98 mEq/L (ref 96–112)
Creatinine, Ser: 3.03 mg/dL — ABNORMAL HIGH (ref 0.50–1.35)
GFR calc Af Amer: 25 mL/min — ABNORMAL LOW (ref >90)
GFR calc non Af Amer: 21 mL/min — ABNORMAL LOW (ref >90)
Glucose, Bld: 233 mg/dL — ABNORMAL HIGH (ref 70–99)
Sodium: 136 mEq/L (ref 135–145)

## 2013-04-20 LAB — GLUCOSE, CAPILLARY
Glucose-Capillary: 146 mg/dL — ABNORMAL HIGH (ref 70–99)
Glucose-Capillary: 149 mg/dL — ABNORMAL HIGH (ref 70–99)
Glucose-Capillary: 127 mg/dL — ABNORMAL HIGH (ref 70–99)

## 2013-04-20 LAB — APTT: aPTT: 45 seconds — ABNORMAL HIGH (ref 24–37)

## 2013-04-20 MED ORDER — AMIODARONE HCL 200 MG PO TABS
400.0000 mg | ORAL_TABLET | Freq: Two times a day (BID) | ORAL | Status: AC
  Administered 2013-04-20 – 2013-04-26 (×14): 400 mg via ORAL
  Filled 2013-04-20 (×14): qty 2

## 2013-04-20 MED ORDER — ATENOLOL 25 MG PO TABS: 25.0000 mg | ORAL_TABLET | Freq: Every day | ORAL | Status: DC

## 2013-04-20 MED ORDER — ZOLPIDEM TARTRATE 5 MG PO TABS
5.0000 mg | ORAL_TABLET | Freq: Every evening | ORAL | Status: DC | PRN
  Administered 2013-04-20 – 2013-04-26 (×6): 5 mg via ORAL
  Filled 2013-04-20 (×6): qty 1

## 2013-04-20 MED ORDER — HEPARIN BOLUS VIA INFUSION
2000.0000 [IU] | Freq: Once | INTRAVENOUS | Status: AC
  Administered 2013-04-20: 2000 [IU] via INTRAVENOUS
  Filled 2013-04-20: qty 2000

## 2013-04-20 NOTE — Progress Notes (Signed)
Subjective: pt sitting up in the chair Cardioverted to sinus rhythm yesterday DOE about the same he says BP's are a little soft  Objective Vital signs in last 24 hours: Filed Vitals:   04/19/13 1515 04/19/13 2036 04/20/13 0633 04/20/13 0941  BP: 94/56 99/50 117/63 109/62  Pulse: 66 62 64   Temp:  97.4 F (36.3 C) 97 F (36.1 C)   TempSrc:  Oral Axillary   Resp:  18 18   Height:      Weight:   110 kg (242 lb 8.1 oz)   SpO2: 98% 99% 100%    Weight change: -1.675 kg (-3 lb 11.1 oz)  Intake/Output Summary (Last 24 hours) at 04/20/13 1142 Last data filed at 04/20/13 0845  Gross per 24 hour  Intake   1020 ml  Output   1050 ml  Net    -30 ml   Physical Exam:  Blood pressure 108/59, pulse 88, temperature 97 F (36.1 C), temperature source Oral, resp. rate 20, height 5\' 8"  (1.727 m), weight 111.676 kg (246 lb 3.2 oz), SpO2 97.00%. Large neck Cannot see neck veins Lungs clear to auscultation  Posteriorly Regular S1S2 o S3 Abdomen protuberant without focal tenderness Woody non pitting changes of both le's  Labs: Basic Metabolic Panel:  Recent Labs Lab 04/18/13 1258 04/19/13 0405 04/20/13 0847  NA 137 137 136  K 3.9 3.6 4.1  CL 99 98 98  CO2 24 23 24   GLUCOSE 193* 144* 233*  BUN 68* 68* 74*  CREATININE 2.60* 2.83* 3.03*  CALCIUM 9.3 9.1 8.9  PHOS  --  4.5 4.2    Recent Labs Lab 04/18/13 1258 04/19/13 0405 04/20/13 0849  WBC 8.8 9.1 6.1  HGB 14.1 15.3 14.0  HCT 39.7 43.2 39.2  MCV 93.4 93.5 93.3  PLT 182 172 151    Recent Labs Lab 04/19/13 1125 04/19/13 1544 04/19/13 2118 04/20/13 0545 04/20/13 1114  GLUCAP 175* 158* 140* 146* 149*   Results for VANSH, RECKART (MRN [id number]) as of 04/19/2013 12:41  Ref. Range 04/18/2013 17:10  Color, Urine Latest Range: YELLOW  YELLOW  APPearance Latest Range: CLEAR  CLEAR  Specific Gravity, Urine Latest Range: 1.005-1.030  1.013  pH Latest Range: 5.0-8.0  5.0  Glucose Latest Range: NEGATIVE mg/dL NEGATIVE   Bilirubin Urine Latest Range: NEGATIVE  NEGATIVE  Ketones, ur Latest Range: NEGATIVE mg/dL NEGATIVE  Protein Latest Range: NEGATIVE mg/dL >409 (A)  Urobilinogen, UA Latest Range: 0.0-1.0 mg/dL 0.2  Nitrite Latest Range: NEGATIVE  NEGATIVE  Leukocytes, UA Latest Range: NEGATIVE  NEGATIVE  Hgb urine dipstick Latest Range: NEGATIVE  TRACE (A)  Urine-Other No range found MUCOUS PRESENT  WBC, UA Latest Range: <3 WBC/hpf 0-2  RBC / HPF Latest Range: <3 RBC/hpf 0-2  Squamous Epithelial / LPF Latest Range: RARE  RARE  Bacteria, UA Latest Range: RARE  RARE  Results for OSIRIS, ODRISCOLL (MRN [id number]) as of 04/19/2013 12:41  Ref. Range 04/18/2013 17:10  PROTEIN CREATININE RATIO Latest Range: 0.00-0.15  3.04 (H)   Studies/Results: US Renal  04/19/2013   CLINICAL DATA:  Elevated creatinine. Solitary kidney.  EXAM: RENAL/URINARY TRACT ULTRASOUND COMPLETE  COMPARISON:  None.  FINDINGS: Right Kidney  Length: 14.6 cm. Increased echogenicity. Multiple (15-20) cysts, with the largest measuring 2.6 x 2.7 x 2.5 cm. No hydronephrosis. No visible calculi.  Left Kidney  Length: Congenitally absent.  Bladder  Appears normal for degree of bladder distention.  IMPRESSION: Solitary right kidney with increased echogenicity suggesting medical  renal disease. Multiple cysts as described. No hydronephrosis.   Electronically Signed   By: Davonna Belling M.D.   On: 04/19/2013 00:53   Medications: . sodium chloride 250 mL (04/18/13 1512)  . heparin 1,450 Units/hr (04/20/13 0052)   . allopurinol  300 mg Oral Daily  . amiodarone  400 mg Oral BID  . aspirin EC  81 mg Oral Daily  . cholecalciferol  1,000 Units Oral Daily  . citalopram  20 mg Oral Daily  . pantoprazole  20 mg Oral Daily  . sodium chloride  3 mL Intravenous Q12H    I  have reviewed scheduled and prn medications.  ASSESSMENT/RECOMMENDATIONS  77M w/ CAD s/p CABG (4V. 2005), CKD4, hx/o solitary kidney, morbid obesity, chronic systolic HF, OSA on CPAP  admitted with dyspnea and Afib with RVR, admitted with recurrent afib and newly reduced EF. Progressive loss of GFR over at least the past 2 years likely as a consequence of HTN in a solitary kidney.   Because of elevated creatinine of 2.6 (last baseline 2.03 October 2012)  renal was consulted.  1. 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  Suspect hemodynamic component to progressive rise as now, despite restoration of SR, BP's are soft. Agree with Dr. Elissa Hefty plan to stop beta blocker to allow for better renal perfusion.  Continue to hold ARB and thiazide for now, probably will need to be restarted after acute issues. Ok with maintenance diuretics as driven by volume status, but don't see an obvious reason right now. Has sig proteinuria based on UPC but otherwise bland UA.  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 Avoid NSAIDs. Keep euvolemic heading into cardiac cath with minimal contrast as able. Pt at high risk of AKI from contrast. Would not cath until renal function clearly improving which is not yet the case. 2. CKD-MBD - PTH still pending.  Ca,phos both OK 3. Dyspnea and AFib with RVR: per cardiology -s/p cardioversion.  Cath when renal stable.     Camille Bal, MD [facility] [phone] pager 04/20/2013, 11:42 AM

## 2013-04-20 NOTE — Progress Notes (Signed)
ANTICOAGULATION CONSULT NOTE Pharmacy Consult for heparin Indication: atrial fibrillation  No Known Allergies  Patient Measurements: Height: 5\' 8"  (172.7 cm) Weight: 246 lb 3.2 oz (111.676 kg) (scale c) IBW/kg (Calculated) : 68.4   Vital Signs: Temp: 97.4 F (36.3 C) (10/17 2036) Temp src: Oral (10/17 2036) BP: 99/50 mmHg (10/17 2036) Pulse Rate: 62 (10/17 2036)  Labs:  Recent Labs  04/18/13 1258 04/19/13 0405 04/19/13 1533 04/20/13  HGB 14.1 15.3  --   --   HCT 39.7 43.2  --   --   PLT 182 172  --   --   APTT 52* 40* 37 45*  HEPARINUNFRC >2.20* 1.04* 0.38 0.20*  CREATININE 2.60* 2.83*  --   --     Estimated Creatinine Clearance: 34.5 ml/min (by C-G formula based on Cr of 2.83).  Assessment: 57 yo male with Afib s/p DCCV for heparin   Goal of Therapy:  Heparin level 0.3-0.7 Monitor platelets by anticoagulation protocol: Yes   Plan:  Heparin 2000 units IV bolus, then increase heparin 1450 units/hr Follow-up am labs.  Geannie Risen, PharmD, BCPS  04/20/2013 12:38 AM

## 2013-04-20 NOTE — Progress Notes (Signed)
ANTICOAGULATION CONSULT NOTE - Follow Up Consult  Pharmacy Consult for heparin Indication: atrial fibrillation  No Known Allergies  Patient Measurements: Height: 5\' 8"  (172.7 cm) Weight: 242 lb 8.1 oz (110 kg) IBW/kg (Calculated) : 68.4 Heparin Dosing Weight: 93.35  Vital Signs: Temp: 97 F (36.1 C) (10/18 0633) Temp src: Axillary (10/18 0633) BP: 109/62 mmHg (10/18 0941) Pulse Rate: 64 (10/18 0633)  Labs:  Recent Labs  04/18/13 1258 04/19/13 0405 04/19/13 1533 04/20/13 04/20/13 0847 04/20/13 0849  HGB 14.1 15.3  --   --   --  14.0  HCT 39.7 43.2  --   --   --  39.2  PLT 182 172  --   --   --  151  APTT 52* 40* 37 45*  --   --   HEPARINUNFRC >2.20* 1.04* 0.38 0.20*  --  0.45  CREATININE 2.60* 2.83*  --   --  3.03*  --     Estimated Creatinine Clearance: 31.9 ml/min (by C-G formula based on Cr of 3.03).   Medications:  Scheduled:  . allopurinol  300 mg Oral Daily  . amiodarone  400 mg Oral BID  . aspirin EC  81 mg Oral Daily  . atenolol  50 mg Oral Daily  . cholecalciferol  1,000 Units Oral Daily  . citalopram  20 mg Oral Daily  . pantoprazole  20 mg Oral Daily  . sodium chloride  3 mL Intravenous Q12H   Infusions:  . sodium chloride 250 mL (04/18/13 1512)  . heparin 1,450 Units/hr (04/20/13 0052)    Assessment: 57 yo male with afib is currently on therapeutic heparin.  Heparin level is now 0.45.  The Xarelto's effect should be gone by this morning. Goal of Therapy:  Heparin level 0.3-0.7 units/ml Monitor platelets by anticoagulation protocol: Yes   Plan:  1) Continue heparin at 1450 units/hr 2) Daily CBC and heparin level  Tandi Hanko, Tsz-Yin 04/20/2013,10:07 AM

## 2013-04-20 NOTE — Progress Notes (Signed)
Patient ID: [name] [name], male   DOB: [birth year], 57 y.o.   MRN: [id number]  Subjective: No complaints. He denies SOB, CP - but no real change since DCCV.  Objective: Vital signs in last 24 hours: Temp:  [97 F (36.1 C)-97.4 F (36.3 C)] 97 F (36.1 C) (10/18 0633) Pulse Rate:  [62-74] 64 (10/18 0633) Resp:  [18] 18 (10/18 0633) BP: (70-117)/(43-63) 109/62 mmHg (10/18 0941) SpO2:  [96 %-100 %] 100 % (10/18 4098) Weight:  [242 lb 8.1 oz (110 kg)] 242 lb 8.1 oz (110 kg) (10/18 1191) Last BM Date: 04/18/13  Intake/Output from previous day: 10/17 0701 - 10/18 0700 In: 780 [P.O.:780] Out: 850 [Urine:850] Intake/Output this shift: Total I/O In: 240 [P.O.:240] Out: 400 [Urine:400]  Medications Current Facility-Administered Medications  Medication Dose Route Frequency Kryslyn Helbig Last Rate Last Dose  . 0.9 %  sodium chloride infusion  250 mL Intravenous Continuous Brittainy Simmons, PA-C 1 mL/hr at 04/18/13 1512 250 mL at 04/18/13 1512  . allopurinol (ZYLOPRIM) tablet 300 mg  300 mg Oral Daily Brittainy Simmons, PA-C   300 mg at 04/20/13 0941  . amiodarone (PACERONE) tablet 400 mg  400 mg Oral BID Marykay Lex, MD      . aspirin EC tablet 81 mg  81 mg Oral Daily Judie Bonus Hammons, RPH   81 mg at 04/20/13 0941  . cholecalciferol (VITAMIN D) tablet 1,000 Units  1,000 Units Oral Daily Judie Bonus Hammons, RPH   1,000 Units at 04/20/13 0946  . citalopram (CELEXA) tablet 20 mg  20 mg Oral Daily Brittainy Simmons, PA-C   20 mg at 04/20/13 0946  . heparin ADULT infusion 100 units/mL (25000 units/250 mL)  1,450 Units/hr Intravenous Continuous [name], MD 14.5 mL/hr at 04/20/13 0052 1,450 Units/hr at 04/20/13 0052  . hydrocortisone cream 1 % 1 application  1 application Topical TID PRN Brittainy Simmons, PA-C      . levalbuterol (XOPENEX HFA) inhaler 1-2 puff  1-2 puff Inhalation Q4H PRN Brittainy Simmons, PA-C      . pantoprazole (PROTONIX) EC tablet 20 mg  20 mg  Oral Daily Brittainy Simmons, PA-C   20 mg at 04/20/13 0941  . sodium chloride 0.9 % injection 3 mL  3 mL Intravenous Q12H Brittainy Simmons, PA-C   3 mL at 04/20/13 0947  . sodium chloride 0.9 % injection 3 mL  3 mL Intravenous PRN Robbie Lis, PA-C        PE: General appearance: alert, cooperative, no distress and moderately obese Lungs: clear to auscultation bilaterally Heart: RRR, distant S1 & S2 - no clear M/R/G. Extremities: no LEE Pulses: 2+ and symmetric Skin: warm and dry Neurologic: Grossly normal  Lab Results:   Recent Labs  04/18/13 1258 04/19/13 0405 04/20/13 0849  WBC 8.8 9.1 6.1  HGB 14.1 15.3 14.0  HCT 39.7 43.2 39.2  PLT 182 172 151   BMET  Recent Labs  04/18/13 1258 04/19/13 0405 04/20/13 0847  NA 137 137 136  K 3.9 3.6 4.1  CL 99 98 98  CO2 24 23 24   GLUCOSE 193* 144* 233*  BUN 68* 68* 74*  CREATININE 2.60* 2.83* 3.03*  CALCIUM 9.3 9.1 8.9   Assessment/Plan  Principal Problem:   Atrial fibrillation with RVR Active Problems:   ASHD (arteriosclerotic heart disease)   Hypertension associated with diabetes   Mosaicism, 45, 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  No real change in dyspnea -- is walking around without trouble.   Main concern is that the Cr is trending up as opposed to down -- Appreciate Renal Consult & rec.   RHC would be helpful to clarify volume status.  Esp with EF ~30% by recent evaluation. But at this point, would not plan LHC/Angio for fear of High RISK CIN.  Have d/c'd BB as the only remaining antihypertensive.   @ this point, will monitor renal Fxn today & consider Milrinone if worsening.  Would hope that with  restoration of NSR & better CO/CI with improved BP that his UOP would pick up & renal Fxn improves.  Probably will need home dose of PO lasix at least   Dr. Rennis Golden feels that we also need to delineate his coronary/graft anatomy -- but cannot pursue Angiography @ this point. RHC OK..    Plan for today is DCCV as he has not chemically cardioverted. - remains on Heparin now that Xarelto has cleared. I agree that with current renal trend, we may need to convert anticoagulation to warfarin from Xarelto. Hold off on long term Rx until cath.  Glycemic control fair.  Continue to ambulate.  MD Time with pt: 15 min  HARDING,Rachael W, M.D., M.S. [facility] [address] [place], [place]  [address]  [phone] Pager # [phone] 04/20/2013 12:49 PM

## 2013-04-21 DIAGNOSIS — I255 Ischemic cardiomyopathy: Secondary | ICD-10-CM | POA: Diagnosis present

## 2013-04-21 DIAGNOSIS — N183 Chronic kidney disease, stage 3 unspecified: Secondary | ICD-10-CM | POA: Diagnosis present

## 2013-04-21 DIAGNOSIS — E119 Type 2 diabetes mellitus without complications: Secondary | ICD-10-CM

## 2013-04-21 DIAGNOSIS — N189 Chronic kidney disease, unspecified: Secondary | ICD-10-CM | POA: Diagnosis not present

## 2013-04-21 DIAGNOSIS — I2589 Other forms of chronic ischemic heart disease: Secondary | ICD-10-CM

## 2013-04-21 LAB — GLUCOSE, CAPILLARY
Glucose-Capillary: 141 mg/dL — ABNORMAL HIGH (ref 70–99)
Glucose-Capillary: 123 mg/dL — ABNORMAL HIGH (ref 70–99)

## 2013-04-21 LAB — RENAL FUNCTION PANEL
Albumin: 3.3 g/dL — ABNORMAL LOW (ref 3.5–5.2)
Calcium: 8.9 mg/dL (ref 8.4–10.5)
GFR calc Af Amer: 25 mL/min — ABNORMAL LOW (ref >90)
GFR calc non Af Amer: 22 mL/min — ABNORMAL LOW (ref >90)
Phosphorus: 4.6 mg/dL (ref 2.3–4.6)
Potassium: 4.1 mEq/L (ref 3.5–5.1)
Sodium: 138 mEq/L (ref 135–145)

## 2013-04-21 LAB — CBC
MCHC: 33.5 g/dL (ref 30.0–36.0)
RDW: 15 % (ref 11.5–15.5)
WBC: 8.3 10*3/uL (ref 4.0–10.5)

## 2013-04-21 LAB — HEPARIN LEVEL (UNFRACTIONATED): Heparin Unfractionated: 0.11 IU/mL — ABNORMAL LOW (ref 0.30–0.70)

## 2013-04-21 MED ORDER — INSULIN ASPART 100 UNIT/ML ~~LOC~~ SOLN
0.0000 [IU] | Freq: Three times a day (TID) | SUBCUTANEOUS | Status: DC
  Administered 2013-04-21 (×2): 1 [IU] via SUBCUTANEOUS
  Administered 2013-04-22: 2 [IU] via SUBCUTANEOUS
  Administered 2013-04-22 – 2013-04-23 (×3): 1 [IU] via SUBCUTANEOUS
  Administered 2013-04-23: 2 [IU] via SUBCUTANEOUS
  Administered 2013-04-24 – 2013-04-26 (×4): 1 [IU] via SUBCUTANEOUS
  Administered 2013-04-26: 2 [IU] via SUBCUTANEOUS

## 2013-04-21 MED ORDER — DIAZEPAM 5 MG PO TABS: 5.0000 mg | ORAL_TABLET | ORAL | Status: DC

## 2013-04-21 MED ORDER — INSULIN ASPART 100 UNIT/ML ~~LOC~~ SOLN: 0.0000 [IU] | Freq: Every day | SUBCUTANEOUS | Status: DC

## 2013-04-21 MED ORDER — SODIUM CHLORIDE 0.45 % IV SOLN
INTRAVENOUS | Status: DC
  Administered 2013-04-22: 06:00:00 via INTRAVENOUS

## 2013-04-21 NOTE — Progress Notes (Signed)
ANTICOAGULATION CONSULT NOTE - Follow Up Consult  Pharmacy Consult for heparin Indication: atrial fibrillation  No Known Allergies  Patient Measurements: Height: 5\' 8"  (172.7 cm) Weight: 249 lb 6.4 oz (113.127 kg) (scale c) IBW/kg (Calculated) : 68.4 Heparin Dosing Weight: 93.35 kg  Vital Signs: Temp: 97.8 F (36.6 C) (10/19 1404) Temp src: Oral (10/19 1404) BP: 107/57 mmHg (10/19 1404) Pulse Rate: 66 (10/19 1404)  Labs:  Recent Labs  04/19/13 0405 04/19/13 1533 04/20/13 04/20/13 0847 04/20/13 0849 04/21/13 0535  HGB 15.3  --   --   --  14.0 13.5  HCT 43.2  --   --   --  39.2 40.3  PLT 172  --   --   --  151 165  APTT 40* 37 45*  --   --   --   HEPARINUNFRC 1.04* 0.38 0.20*  --  0.45 0.11*  CREATININE 2.83*  --   --  3.03*  --  2.95*    Estimated Creatinine Clearance: 33.3 ml/min (by C-G formula based on Cr of 2.95).   Medications:  Scheduled:  . allopurinol  300 mg Oral Daily  . amiodarone  400 mg Oral BID  . aspirin EC  81 mg Oral Daily  . cholecalciferol  1,000 Units Oral Daily  . citalopram  20 mg Oral Daily  . insulin aspart  0-5 Units Subcutaneous QHS  . insulin aspart  0-9 Units Subcutaneous TID WC  . pantoprazole  20 mg Oral Daily  . sodium chloride  3 mL Intravenous Q12H   Infusions:  . sodium chloride 250 mL (04/21/13 0536)  . heparin 1,450 Units/hr (04/21/13 1246)    Assessment: 57 yo male with afib is currently on subtherapeutic heparin.  Heparin level was <0.1.  Per RN, there was not any problem with the infusion.  Hgb 13.5 and Plt 165 K stable.  Patient was on xarelto PTA. Its effect should be out by now. Goal of Therapy:  Heparin level 0.3-0.7 units/ml Monitor platelets by anticoagulation protocol: Yes   Plan:  1) Increase heparin to 1700 units/hr. Check a 6hr heparin level 2) Daily heparin level and CBC  Bret Stamour, Tsz-Yin 04/21/2013,3:00 PM

## 2013-04-21 NOTE — Progress Notes (Signed)
Subjective: pt sitting up in the chair Has remained in sinus rhythm Off BB BP has increased some Breathing unchanged since cardioversion  Objective Vital signs in last 24 hours: Filed Vitals:   04/20/13 0941 04/20/13 1351 04/20/13 2103 04/21/13 0601  BP: 109/62 122/61 113/55 152/85  Pulse:  65 64 67  Temp:  97.8 F (36.6 C) 97.9 F (36.6 C) 97.7 F (36.5 C)  TempSrc:  Oral Oral Oral  Resp:  18 18 18   Height:      Weight:    113.127 kg (249 lb 6.4 oz)  SpO2:  97% 99% 96%   Weight change: 3.127 kg (6 lb 14.3 oz)  Intake/Output Summary (Last 24 hours) at 04/21/13 1116 Last data filed at 04/21/13 0941  Gross per 24 hour  Intake   1369 ml  Output    675 ml  Net    694 ml   Physical Exam:  Blood pressure 108/59, pulse 88, temperature 97 F (36.1 C), temperature source Oral, resp. rate 20, height 5\' 8"  (1.727 m), weight 111.676 kg (246 lb 3.2 oz), SpO2 97.00%. Large neck Cannot see neck veins Lungs clear  Regular S1S2 o S3 Abdomen protuberant without focal tenderness Woody non pitting changes of both le's unchanged  Labs: Basic Metabolic Panel:  Recent Labs Lab 04/18/13 1258 04/19/13 0405 04/20/13 0847 04/21/13 0535  NA 137 137 136 138  K 3.9 3.6 4.1 4.1  CL 99 98 98 99  CO2 24 23 24 23   GLUCOSE 193* 144* 233* 132*  BUN 68* 68* 74* 71*  CREATININE 2.60* 2.83* 3.03* 2.95*  CALCIUM 9.3 9.1 8.9 8.9  PHOS  --  4.5 4.2 4.6    Recent Labs Lab 04/18/13 1258 04/19/13 0405 04/20/13 0849 04/21/13 0535  WBC 8.8 9.1 6.1 8.3  HGB 14.1 15.3 14.0 13.5  HCT 39.7 43.2 39.2 40.3  MCV 93.4 93.5 93.3 94.6  PLT 182 172 151 165    Recent Labs Lab 04/20/13 0545 04/20/13 1114 04/20/13 1559 04/20/13 2136 04/21/13 0555  GLUCAP 146* 149* 127* 141* 121*   Results for [name] [name], [name] [name] (MRN [id number]) as of 04/19/2013 12:41  Ref. Range 04/18/2013 17:10  Color, Urine Latest Range: YELLOW  YELLOW  APPearance Latest Range: CLEAR  CLEAR  Specific Gravity, Urine Latest Range:  1.005-1.030  1.013  pH Latest Range: 5.0-8.0  5.0  Glucose Latest Range: NEGATIVE mg/dL NEGATIVE  Bilirubin Urine Latest Range: NEGATIVE  NEGATIVE  Ketones, ur Latest Range: NEGATIVE mg/dL NEGATIVE  Protein Latest Range: NEGATIVE mg/dL >829 (A)  Urobilinogen, UA Latest Range: 0.0-1.0 mg/dL 0.2  Nitrite Latest Range: NEGATIVE  NEGATIVE  Leukocytes, UA Latest Range: NEGATIVE  NEGATIVE  Hgb urine dipstick Latest Range: NEGATIVE  TRACE (A)  Urine-Other No range found MUCOUS PRESENT  WBC, UA Latest Range: <3 WBC/hpf 0-2  RBC / HPF Latest Range: <3 RBC/hpf 0-2  Squamous Epithelial / LPF Latest Range: RARE  RARE  Bacteria, UA Latest Range: RARE  RARE  Results for [name] [name], [name] [name] (MRN [id number]) as of 04/19/2013 12:41  Ref. Range 04/18/2013 17:10  PROTEIN CREATININE RATIO Latest Range: 0.00-0.15  3.04 (H)   Studies/Results: No results found. Medications: . sodium chloride 250 mL (04/21/13 0536)  . heparin 1,450 Units/hr (04/20/13 1653)   . allopurinol  300 mg Oral Daily  . amiodarone  400 mg Oral BID  . aspirin EC  81 mg Oral Daily  . cholecalciferol  1,000 Units Oral Daily  . citalopram  20 mg Oral  Daily  . insulin aspart  0-5 Units Subcutaneous QHS  . insulin aspart  0-9 Units Subcutaneous TID WC  . pantoprazole  20 mg Oral Daily  . sodium chloride  3 mL Intravenous Q12H    I  have reviewed scheduled and prn medications.  ASSESSMENT/RECOMMENDATIONS  31M w/ CAD s/p CABG (4V. 2005), CKD4, hx/o solitary kidney, morbid obesity, chronic systolic HF, OSA on CPAP admitted with dyspnea and Afib with RVR, admitted with recurrent afib and newly reduced EF. Progressive loss of GFR over at least the past 2 years perhaps as a consequence of HTN/DM in a solitary kidney, with significant proteinuria.   Because of elevated creatinine of 2.6 (last baseline 2.03 October 2012)  renal was consulted this admission.  1. 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  Suspect hemodynamic component to progressive rise and with  restoration of SR, discontinueation o f BB perhaps better renal perfusion as trending down now.  Continue to hold ARB and thiazide for now, consider whether appropriate to restart after acute issues resolved. No indication to restart diuretics at this point.  Has sig proteinuria based on UPC but otherwise bland UA.  Avoid NSAIDs. Keep euvolemic. Would not proceed with left heart cath on Monday as originally planned give creatinine trending.  Pt at high risk of AKI from contrast. Would not cath until renal function clearly improving which is not yet the case. 2. CKD-MBD - PTH still pending.  Ca,phos both OK 3. Dyspnea and AFib with RVR: per cardiology -s/p cardioversion.  Cath when renal stable.  Possible right heart cath tomorrow     Camille Bal, MD [facility] [phone] pager 04/21/2013, 11:16 AM

## 2013-04-21 NOTE — Progress Notes (Signed)
04/21/13 7p-7a Pt.is A/Ox4 and is ambulatory without assistance. He had no c/o pain and no signs of distress. Sleeps with CPAP at night. Requested prn Ambien so MD on call was notified. Has continuous heparin drip running. Around 0530 this am pt.'s IV pump was alarming occluded. Paused drip and assessed pt.'s IV site. IV catheter was kinked so IV site was re-dressed and flushed. Heaprin drip was immediately restarted. Pharmacy called this am at 0650  about pt.'s heparin level and it was explained to pharmacist about the IV site and IV pump. Was told that another level will be drawn later.

## 2013-04-21 NOTE — Progress Notes (Signed)
Subjective:  No SOB, up in chair  Objective:  Vital Signs in the last 24 hours: Temp:  [97.7 F (36.5 C)-97.9 F (36.6 C)] 97.7 F (36.5 C) (10/19 0601) Pulse Rate:  [64-67] 67 (10/19 0601) Resp:  [18] 18 (10/19 0601) BP: (113-152)/(55-85) 152/85 mmHg (10/19 0601) SpO2:  [96 %-99 %] 96 % (10/19 0601) Weight:  [249 lb 6.4 oz (113.127 kg)] 249 lb 6.4 oz (113.127 kg) (10/19 0601)  Intake/Output from previous day:  Intake/Output Summary (Last 24 hours) at 04/21/13 1110 Last data filed at 04/21/13 0941  Gross per 24 hour  Intake   1369 ml  Output    675 ml  Net    694 ml    Physical Exam: General appearance: alert, cooperative, no distress and morbidly obese Lungs: clear to auscultation bilaterally; nonlabored Heart: regular rate and rhythm, distant heart sounds but no M./R./G. Abdomen: Obese, soft/NT/ND/NA BS Extremities: No C./C./80   Rate: 68  Rhythm: normal sinus rhythm  Lab Results:  Recent Labs  04/20/13 0849 04/21/13 0535  WBC 6.1 8.3  HGB 14.0 13.5  PLT 151 165    Recent Labs  04/20/13 0847 04/21/13 0535  NA 136 138  K 4.1 4.1  CL 98 99  CO2 24 23  GLUCOSE 233* 132*  BUN 74* 71*  CREATININE 3.03* 2.95*   No results found for this basename: TROPONINI, CK, MB,  in the last 72 hours No results found for this basename: INR,  in the last 72 hours  Imaging: Imaging results have been reviewed  Cardiac Studies:  Assessment/Plan:   Principal Problem:   Atrial fibrillation with RVR- DCCV 04/19/13 to NSR Active Problems:   Acute systolic congestive heart failure, NYHA class 4   Acute on chronic renal insufficiency   Atrophy of left kidney   Chronic anticoagulation   S/P CABG x 3 2005. 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 Possible Rt heart cath in am per Dr Herbie Baltimore. Lt heart cath at some point in the future. He is on Amiodarone loading. Xarelto on hold, he is on Heparin. Renal service following.  Corine Shelter PA-C Beeper [phone] 04/21/2013, 11:10 AM  I have seen and evaluated the patient this a.m. along with Corine Shelter, PA.  I agree with his findings, examination as well as impression recommendations.  Renal function at least seems to plateau'd, as he is not acutely dyspneic, will hold off on diuretics until we see what direction his renal function goes.  I agree that I will not proceed with left heart catheterization tomorrow as originally scheduled.  I do think however that right heart catheterization may be reasonable based on Dr. Blanchie Dessert initial a medical assessment prior to hospitalization.  This will allow [place] to get bedrest and of the patient's volume status in order to determine whether he needs diuresis.  Stable glycemic control. Blood pressure has increased being of beta blocker, however at this point increased renal perfusion is the intended role.  We will continue to hold his antihypertensive agents for now, but will consider  restarting afterload reduction based on Right Heart numbers.  He remains in sinus rhythm after cardioversion.  Continue oral amiodarone load.  He is on heparin IV for now with plans for catheterization.  We'll then decide on plans for doing left heart cath or not.  Once the decision is made, we'll need to initiate long-term coagulation.  A lot of it will depend on his renal function, as Xarelto may not be as appropriate with worsening renal function.  He will also eventually need an ischemic evaluation to simply because of his EF decreasing.  With the thought of having  catheterization on hold, consider the possibility of doing a noninvasive evaluation with Myoview stress test.   Marykay Lex, M.D., M.S. [facility] [address] [place], [place]  [address]  [phone] Pager # [phone] 04/21/2013 11:30 AM

## 2013-04-22 ENCOUNTER — Inpatient Hospital Stay (HOSPITAL_COMMUNITY): Payer: Managed Care, Other (non HMO)

## 2013-04-22 ENCOUNTER — Other Ambulatory Visit: Payer: Self-pay

## 2013-04-22 ENCOUNTER — Encounter (HOSPITAL_COMMUNITY)
Admission: AD | Disposition: A | Discharge status: Home / Self Care | Payer: Self-pay | Source: Ambulatory Visit | Attending: Internal Medicine

## 2013-04-22 DIAGNOSIS — N289 Disorder of kidney and ureter, unspecified: Secondary | ICD-10-CM

## 2013-04-22 DIAGNOSIS — G4730 Sleep apnea, unspecified: Secondary | ICD-10-CM

## 2013-04-22 DIAGNOSIS — N189 Chronic kidney disease, unspecified: Secondary | ICD-10-CM

## 2013-04-22 LAB — CBC
HCT: 39.7 % (ref 39.0–52.0)
Hemoglobin: 13.6 g/dL (ref 13.0–17.0)
MCHC: 34.3 g/dL (ref 30.0–36.0)
RBC: 4.23 MIL/uL (ref 4.22–5.81)
WBC: 10.8 10*3/uL — ABNORMAL HIGH (ref 4.0–10.5)

## 2013-04-22 LAB — GLUCOSE, CAPILLARY
Glucose-Capillary: 149 mg/dL — ABNORMAL HIGH (ref 70–99)
Glucose-Capillary: 123 mg/dL — ABNORMAL HIGH (ref 70–99)
Glucose-Capillary: 126 mg/dL — ABNORMAL HIGH (ref 70–99)

## 2013-04-22 LAB — HEPARIN LEVEL (UNFRACTIONATED): Heparin Unfractionated: 0.2 IU/mL — ABNORMAL LOW (ref 0.30–0.70)

## 2013-04-22 LAB — RENAL FUNCTION PANEL
CO2: 23 mEq/L (ref 19–32)
GFR calc Af Amer: 34 mL/min — ABNORMAL LOW (ref >90)
Glucose, Bld: 134 mg/dL — ABNORMAL HIGH (ref 70–99)
Potassium: 4 mEq/L (ref 3.5–5.1)
Sodium: 135 mEq/L (ref 135–145)

## 2013-04-22 LAB — PROTIME-INR
INR: 1.07 (ref 0.00–1.49)
Prothrombin Time: 13.7 seconds (ref 11.6–15.2)

## 2013-04-22 LAB — PARATHYROID HORMONE, INTACT (NO CA): PTH: 74.8 pg/mL — ABNORMAL HIGH (ref 14.0–72.0)

## 2013-04-22 SURGERY — RIGHT HEART CATH
Anesthesia: LOCAL

## 2013-04-22 MED ORDER — REGADENOSON 0.4 MG/5ML IV SOLN
INTRAVENOUS | Status: AC
  Administered 2013-04-22: 0.4 mg via INTRAVENOUS
  Filled 2013-04-22: qty 5

## 2013-04-22 MED ORDER — ACETAMINOPHEN 325 MG PO TABS
650.0000 mg | ORAL_TABLET | ORAL | Status: DC | PRN
  Administered 2013-04-22 – 2013-04-26 (×5): 650 mg via ORAL
  Filled 2013-04-22 (×5): qty 2

## 2013-04-22 MED ORDER — TECHNETIUM TC 99M SESTAMIBI GENERIC - CARDIOLITE
30.0000 | Freq: Once | INTRAVENOUS | Status: AC | PRN
  Administered 2013-04-22: 30 via INTRAVENOUS

## 2013-04-22 MED ORDER — HEPARIN BOLUS VIA INFUSION
2000.0000 [IU] | Freq: Once | INTRAVENOUS | Status: AC
  Administered 2013-04-22: 2000 [IU] via INTRAVENOUS
  Filled 2013-04-22: qty 2000

## 2013-04-22 MED ORDER — REGADENOSON 0.4 MG/5ML IV SOLN
0.4000 mg | Freq: Once | INTRAVENOUS | Status: AC
  Administered 2013-04-22: 0.4 mg via INTRAVENOUS

## 2013-04-22 MED ORDER — TECHNETIUM TC 99M SESTAMIBI GENERIC - CARDIOLITE
10.0000 | Freq: Once | INTRAVENOUS | Status: AC | PRN
  Administered 2013-04-22: 12:00:00 10 via INTRAVENOUS

## 2013-04-22 NOTE — Progress Notes (Signed)
DAILY PROGRESS NOTE  Subjective:  No events overnight. He still reports being dyspneic, despite now being in sinus and after diuresis. He does not think he feels much better than he did when he was admitted.  Objective:  Temp:  [97.8 F (36.6 C)-98.3 F (36.8 C)] 98 F (36.7 C) (10/20 0456) Pulse Rate:  [66-67] 67 (10/19 2035) Resp:  [20-22] 20 (10/20 0456) BP: (107-160)/(57-69) 160/67 mmHg (10/20 0456) SpO2:  [98 %-100 %] 100 % (10/20 0456) Weight:  [248 lb (112.492 kg)] 248 lb (112.492 kg) (10/20 0456) Weight change: -1 lb 6.4 oz (-0.635 kg)  Intake/Output from previous day: 10/19 0701 - 10/20 0700 In: 1275.8 [P.O.:920; I.V.:355.8] Out: 1875 [Urine:1875]  Intake/Output from this shift: Total I/O In: 240 [P.O.:240] Out: 200 [Urine:200]  Medications: Current Facility-Administered Medications  Medication Dose Route Frequency Provider Last Rate Last Dose  . 0.45 % sodium chloride infusion   Intravenous Continuous Abelino Derrick, PA-C 50 mL/hr at 04/22/13 0546    . 0.9 %  sodium chloride infusion  250 mL Intravenous Continuous Brittainy Simmons, PA-C 1 mL/hr at 04/21/13 0536 250 mL at 04/21/13 0536  . allopurinol (ZYLOPRIM) tablet 300 mg  300 mg Oral Daily Brittainy Simmons, PA-C   300 mg at 04/21/13 7829  . amiodarone (PACERONE) tablet 400 mg  400 mg Oral BID Marykay Lex, MD   400 mg at 04/21/13 2144  . aspirin EC tablet 81 mg  81 mg Oral Daily Judie Bonus Hammons, RPH   81 mg at 04/21/13 5621  . cholecalciferol (VITAMIN D) tablet 1,000 Units  1,000 Units Oral Daily Judie Bonus Hammons, RPH   1,000 Units at 04/21/13 3086  . citalopram (CELEXA) tablet 20 mg  20 mg Oral Daily Brittainy Simmons, PA-C   20 mg at 04/21/13 5784  . diazepam (VALIUM) tablet 5 mg  5 mg Oral On Call Abelino Derrick, PA-C      . heparin ADULT infusion 100 units/mL (25000 units/250 mL)  2,000 Units/hr Intravenous Continuous [name], MD 20 mL/hr at 04/22/13 0457 2,000 Units/hr at  04/22/13 0457  . hydrocortisone cream 1 % 1 application  1 application Topical TID PRN Brittainy Simmons, PA-C      . insulin aspart (novoLOG) injection 0-5 Units  0-5 Units Subcutaneous QHS Luke K Kilroy, PA-C      . insulin aspart (novoLOG) injection 0-9 Units  0-9 Units Subcutaneous TID WC Abelino Derrick, PA-C   2 Units at 04/22/13 [phone]  . levalbuterol (XOPENEX HFA) inhaler 1-2 puff  1-2 puff Inhalation Q4H PRN Brittainy Simmons, PA-C      . pantoprazole (PROTONIX) EC tablet 20 mg  20 mg Oral Daily Brittainy Simmons, PA-C   20 mg at 04/21/13 0938  . sodium chloride 0.9 % injection 3 mL  3 mL Intravenous Q12H Brittainy Simmons, PA-C   3 mL at 04/21/13 2145  . sodium chloride 0.9 % injection 3 mL  3 mL Intravenous PRN Brittainy Simmons, PA-C   3 mL at 04/21/13 0536  . zolpidem (AMBIEN) tablet 5 mg  5 mg Oral QHS PRN [name], MD   5 mg at 04/21/13 2326    Physical Exam: General appearance: alert and no distress Neck: no carotid bruit and JVP difficult to assess, but not distended, no AJR Lungs: clear to auscultation bilaterally Heart: regular rate and rhythm, S1, S2 normal, no murmur, click, rub or gallop Abdomen: soft, non-tender; bowel sounds normal; no masses,  no  organomegaly and obese Extremities: extremities normal, atraumatic, no cyanosis or edema Pulses: 2+ and symmetric Skin: Skin color, texture, turgor normal. No rashes or lesions Neurologic: Grossly normal Psych: Impatient, wants to go home  Lab Results: Results for orders placed during the hospital encounter of 04/18/13 (from the past 48 hour(s))  GLUCOSE, CAPILLARY     Status: Abnormal   Collection Time    04/20/13 11:14 AM      Result Value Range   Glucose-Capillary 149 (*) 70 - 99 mg/dL  GLUCOSE, CAPILLARY     Status: Abnormal   Collection Time    04/20/13  3:59 PM      Result Value Range   Glucose-Capillary 127 (*) 70 - 99 mg/dL  GLUCOSE, CAPILLARY     Status: Abnormal   Collection Time    04/20/13  9:36  PM      Result Value Range   Glucose-Capillary 141 (*) 70 - 99 mg/dL  CBC     Status: None   Collection Time    04/21/13  5:35 AM      Result Value Range   WBC 8.3  4.0 - 10.5 K/uL   RBC 4.26  4.22 - 5.81 MIL/uL   Hemoglobin 13.5  13.0 - 17.0 g/dL   HCT 45.4  09.8 - 11.9 %   MCV 94.6  78.0 - 100.0 fL   MCH 31.7  26.0 - 34.0 pg   MCHC 33.5  30.0 - 36.0 g/dL   RDW 14.7  82.9 - 56.2 %   Platelets 165  150 - 400 K/uL  HEPARIN LEVEL (UNFRACTIONATED)     Status: Abnormal   Collection Time    04/21/13  5:35 AM      Result Value Range   Heparin Unfractionated 0.11 (*) 0.30 - 0.70 IU/mL   Comment:            IF HEPARIN RESULTS ARE BELOW     EXPECTED VALUES, AND PATIENT     DOSAGE HAS BEEN CONFIRMED,     SUGGEST FOLLOW UP TESTING     OF ANTITHROMBIN III LEVELS.  RENAL FUNCTION PANEL     Status: Abnormal   Collection Time    04/21/13  5:35 AM      Result Value Range   Sodium 138  135 - 145 mEq/L   Potassium 4.1  3.5 - 5.1 mEq/L   Chloride 99  96 - 112 mEq/L   CO2 23  19 - 32 mEq/L   Glucose, Bld 132 (*) 70 - 99 mg/dL   BUN 71 (*) 6 - 23 mg/dL   Creatinine, Ser 1.30 (*) 0.50 - 1.35 mg/dL   Calcium 8.9  8.4 - 86.5 mg/dL   Phosphorus 4.6  2.3 - 4.6 mg/dL   Albumin 3.3 (*) 3.5 - 5.2 g/dL   GFR calc non Af Amer 22 (*) >90 mL/min   GFR calc Af Amer 25 (*) >90 mL/min   Comment: (NOTE)     The eGFR has been calculated using the CKD EPI equation.     This calculation has not been validated in all clinical situations.     eGFR's persistently <90 mL/min signify possible Chronic Kidney     Disease.  GLUCOSE, CAPILLARY     Status: Abnormal   Collection Time    04/21/13  5:55 AM      Result Value Range   Glucose-Capillary 121 (*) 70 - 99 mg/dL   Comment 1 Documented in Chart  Comment 2 Notify RN    GLUCOSE, CAPILLARY     Status: Abnormal   Collection Time    04/21/13 11:10 AM      Result Value Range   Glucose-Capillary 141 (*) 70 - 99 mg/dL   Comment 1 Notify RN    HEPARIN  LEVEL (UNFRACTIONATED)     Status: Abnormal   Collection Time    04/21/13  2:47 PM      Result Value Range   Heparin Unfractionated <0.10 (*) 0.30 - 0.70 IU/mL   Comment:            IF HEPARIN RESULTS ARE BELOW     EXPECTED VALUES, AND PATIENT     DOSAGE HAS BEEN CONFIRMED,     SUGGEST FOLLOW UP TESTING     OF ANTITHROMBIN III LEVELS.  GLUCOSE, CAPILLARY     Status: Abnormal   Collection Time    04/21/13  4:21 PM      Result Value Range   Glucose-Capillary 127 (*) 70 - 99 mg/dL   Comment 1 Notify RN    GLUCOSE, CAPILLARY     Status: Abnormal   Collection Time    04/21/13  9:41 PM      Result Value Range   Glucose-Capillary 123 (*) 70 - 99 mg/dL   Comment 1 Notify RN    HEPARIN LEVEL (UNFRACTIONATED)     Status: Abnormal   Collection Time    04/21/13 11:00 PM      Result Value Range   Heparin Unfractionated 0.20 (*) 0.30 - 0.70 IU/mL   Comment:            IF HEPARIN RESULTS ARE BELOW     EXPECTED VALUES, AND PATIENT     DOSAGE HAS BEEN CONFIRMED,     SUGGEST FOLLOW UP TESTING     OF ANTITHROMBIN III LEVELS.  CBC     Status: Abnormal   Collection Time    04/22/13  5:24 AM      Result Value Range   WBC 10.8 (*) 4.0 - 10.5 K/uL   Comment: WHITE COUNT CONFIRMED ON SMEAR   RBC 4.23  4.22 - 5.81 MIL/uL   Hemoglobin 13.6  13.0 - 17.0 g/dL   HCT 62.1  30.8 - 65.7 %   MCV 93.9  78.0 - 100.0 fL   MCH 32.2  26.0 - 34.0 pg   MCHC 34.3  30.0 - 36.0 g/dL   RDW 84.6  96.2 - 95.2 %   Platelets 193  150 - 400 K/uL  HEPARIN LEVEL (UNFRACTIONATED)     Status: None   Collection Time    04/22/13  5:24 AM      Result Value Range   Heparin Unfractionated 0.33  0.30 - 0.70 IU/mL   Comment:            IF HEPARIN RESULTS ARE BELOW     EXPECTED VALUES, AND PATIENT     DOSAGE HAS BEEN CONFIRMED,     SUGGEST FOLLOW UP TESTING     OF ANTITHROMBIN III LEVELS.  PROTIME-INR     Status: None   Collection Time    04/22/13  5:24 AM      Result Value Range   Prothrombin Time 13.7  11.6 -  15.2 seconds   INR 1.07  0.00 - 1.49  GLUCOSE, CAPILLARY     Status: Abnormal   Collection Time    04/22/13  6:15 AM      Result Value Range  Glucose-Capillary 149 (*) 70 - 99 mg/dL   Comment 1 Documented in Chart     Comment 2 Notify RN    RENAL FUNCTION PANEL     Status: Abnormal   Collection Time    04/22/13  8:35 AM      Result Value Range   Sodium 135  135 - 145 mEq/L   Potassium 4.0  3.5 - 5.1 mEq/L   Chloride 99  96 - 112 mEq/L   CO2 23  19 - 32 mEq/L   Glucose, Bld 134 (*) 70 - 99 mg/dL   BUN 54 (*) 6 - 23 mg/dL   Creatinine, Ser 1.61 (*) 0.50 - 1.35 mg/dL   Calcium 9.1  8.4 - 09.6 mg/dL   Phosphorus 2.4  2.3 - 4.6 mg/dL   Albumin 3.3 (*) 3.5 - 5.2 g/dL   GFR calc non Af Amer 29 (*) >90 mL/min   GFR calc Af Amer 34 (*) >90 mL/min   Comment: (NOTE)     The eGFR has been calculated using the CKD EPI equation.     This calculation has not been validated in all clinical situations.     eGFR's persistently <90 mL/min signify possible Chronic Kidney     Disease.    Imaging: No results found.  Assessment:  1. Principal Problem: 2.   Atrial fibrillation with RVR- DCCV 04/19/13 to NSR 3. Active Problems: 4.   Hypertension associated with diabetes 5.   Hyperlipidemia LDL goal <70 6.   Sleep apnea- on C-pap 7.   Depression 8.   Atrophy of left kidney 9.   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   Type II or unspecified type diabetes mellitus without mention of complication, not stated as uncontrolled 11.   Obesity: BMI 36.8 12.   Chronic anticoagulation 13.   Acute systolic congestive heart failure, NYHA class 4 14.   S/P CABG x 3 2005. Patent grafts 5/12 15.   Cardiomyopathy, ischemic- EF 30-40% 2D 03/25/13 16.   Acute on chronic renal insufficiency 17.   Chronic renal insufficiency, stage III (moderate) 18.   Plan:  1. He does not report any significant improvement in his symptoms since admission, despite being converted from a-fib  with RVR to sinus and diuresis. I'm concerned that his symptoms (marked dyspnea), which is what he had prior to CABG, represents progression of obstructive graft disease. The main issue is his solitary kidney and not surprisingly, he wants to avoid LHC at all costs. We discussed possible RHC to assess CO and volume status - however, I think we need to assess his grafts first with a stress test. If this is positive, then we may need to discuss high risk cath and can do RHC at the same time. He is in favor of this approach. Plan for NST today, he has been NPO.  Time Spent Directly with Patient:  30 minutes  Length of Stay:  LOS: 4 days   [name], MD, [facility] Attending Cardiologist [facility]  Kanyon Bunn C 04/22/2013, 10:26 AM

## 2013-04-22 NOTE — Progress Notes (Signed)
Pt.is resting in bed with CPAP on. He has had no c/o pain. Pt.requested something for sleep and prn Ambien was given. He is ambulatory without assistance and is on a continuous heparin drip. Pharmacist Tammy Sours from  pharmacy called around 0009 to report that the patient's heparin level was still low and that orders would be given to increase dose. Pt.was previously on  1700 units (17ml /hr) and that was increased to 2000  ( 20 ml/hr) and also received 2000 unit heparin bolus. Pt.has been NPO since midnight for right cardiac cath later in the am. Consent form is signed and in chart. Has orders to discontinue heparin infusion on call to cardiac cath lab. Pt.decliined to watch cardiac cath/ PCI video but was given cardiac catheterization brochure . He has had no c/o pain and no signs of distress.

## 2013-04-22 NOTE — Progress Notes (Signed)
Patient ID: [name] [name], male   DOB: 1955-12-10, 57 y.o.   MRN: [id number] S:c/o HA and stiff neck following stress test O:BP 143/74  Pulse 75  Temp(Src) 98.6 F (37 C) (Oral)  Resp 20  Ht 5\' 8"  (1.727 m)  Wt 112.492 kg (248 lb)  BMI 37.72 kg/m2  SpO2 95%  Intake/Output Summary (Last 24 hours) at 04/22/13 1508 Last data filed at 04/22/13 1423  Gross per 24 hour  Intake 1075.77 ml  Output   1000 ml  Net  75.77 ml   Intake/Output: I/O last 3 completed shifts: In: 1604.8 [P.O.:1040; I.V.:564.8] Out: 2275 [Urine:2275]  Intake/Output this shift:  Total I/O In: 240 [P.O.:240] Out: 200 [Urine:200] Weight change: -0.635 kg (-1 lb 6.4 oz) Gen:WD obese WM in NAD CVS:no rub Resp:cta WGN:FAOZHY Ext:tr edema   Recent Labs Lab 04/18/13 1258 04/19/13 0405 04/20/13 0847 04/21/13 0535 04/22/13 0835  NA 137 137 136 138 135  K 3.9 3.6 4.1 4.1 4.0  CL 99 98 98 99 99  CO2 24 23 24 23 23   GLUCOSE 193* 144* 233* 132* 134*  BUN 68* 68* 74* 71* 54*  CREATININE 2.60* 2.83* 3.03* 2.95* 2.32*  ALBUMIN  --   --  3.3* 3.3* 3.3*  CALCIUM 9.3 9.1 8.9 8.9 9.1  PHOS  --  4.5 4.2 4.6 2.4   Liver Function Tests:  Recent Labs Lab 04/20/13 0847 04/21/13 0535 04/22/13 0835  ALBUMIN 3.3* 3.3* 3.3*   No results found for this basename: LIPASE, AMYLASE,  in the last 168 hours No results found for this basename: AMMONIA,  in the last 168 hours CBC:  Recent Labs Lab 04/18/13 1258 04/19/13 0405 04/20/13 0849 04/21/13 0535 04/22/13 0524  WBC 8.8 9.1 6.1 8.3 10.8*  HGB 14.1 15.3 14.0 13.5 13.6  HCT 39.7 43.2 39.2 40.3 39.7  MCV 93.4 93.5 93.3 94.6 93.9  PLT 182 172 151 165 193   Cardiac Enzymes: No results found for this basename: CKTOTAL, CKMB, CKMBINDEX, TROPONINI,  in the last 168 hours CBG:  Recent Labs Lab 04/21/13 1110 04/21/13 1621 04/21/13 2141 04/22/13 0615 04/22/13 1125  GLUCAP 141* 127* 123* 149* 130*    Iron Studies: No results found for this basename: IRON,  TIBC, TRANSFERRIN, FERRITIN,  in the last 72 hours Studies/Results: No results found. Marland Kitchen allopurinol  300 mg Oral Daily  . amiodarone  400 mg Oral BID  . aspirin EC  81 mg Oral Daily  . cholecalciferol  1,000 Units Oral Daily  . citalopram  20 mg Oral Daily  . diazepam  5 mg Oral On Call  . insulin aspart  0-5 Units Subcutaneous QHS  . insulin aspart  0-9 Units Subcutaneous TID WC  . pantoprazole  20 mg Oral Daily  . sodium chloride  3 mL Intravenous Q12H    BMET    Component Value Date/Time   NA 135 04/22/2013 0835   K 4.0 04/22/2013 0835   CL 99 04/22/2013 0835   CO2 23 04/22/2013 0835   GLUCOSE 134* 04/22/2013 0835   BUN 54* 04/22/2013 0835   CREATININE 2.32* 04/22/2013 0835   CREATININE 2.60* 04/11/2013 0831   CALCIUM 9.1 04/22/2013 0835   GFRNONAA 29* 04/22/2013 0835   GFRAA 34* 04/22/2013 0835   CBC    Component Value Date/Time   WBC 10.8* 04/22/2013 0524   RBC 4.23 04/22/2013 0524   HGB 13.6 04/22/2013 0524   HCT 39.7 04/22/2013 0524   PLT 193 04/22/2013 0524  MCV 93.9 04/22/2013 0524   MCH 32.2 04/22/2013 0524   MCHC 34.3 04/22/2013 0524   RDW 14.7 04/22/2013 0524   LYMPHSABS 2.9 10/09/2012 1227   MONOABS 0.9 10/09/2012 1227   EOSABS 0.0 10/09/2012 1227   BASOSABS 0.0 10/09/2012 1227   Assessment/Plan:  1. AKI/CKD- improved with improved hemodynamics and sinus rhythm.   2. Dyspnea- not relieved with cardioversion and agree with stress test to r/o ischemia.  Pt aware of risk of CIN if left heart cath indicated but acknowledges the risks of not proceeding with cath if it does show reversible ischemia.  Discussed risk of requiring HD post cath but also discussed increased cardiovascular risk for not proceeding if warranted. Await stress test results and possible right heart cath 3. OSA- on CPAP 4. HTN- stable but could use better control. Consider BB per Dr. Rennis Golden 5. A fib with RVR s/p DCCV 10/17 now in NSR 6. Chronic systolic HF- diuresed.  Will follow 7. DM- per  primary svc 8. obestity 9. dyslipidemia  Tayana Shankle A

## 2013-04-22 NOTE — Plan of Care (Signed)
Problem: Consults Goal: Cardiac Cath Patient Education (See Patient Education module for education specifics.)  Outcome: Completed/Met Date Met:  04/22/13 Patient was given cardiac catheterization brochure. Pt.declined to watch video.

## 2013-04-22 NOTE — Progress Notes (Signed)
ANTICOAGULATION CONSULT NOTE Pharmacy Consult for heparin Indication: atrial fibrillation  No Known Allergies  Patient Measurements: Height: 5\' 8"  (172.7 cm) Weight: 249 lb 6.4 oz (113.127 kg) (scale c) IBW/kg (Calculated) : 68.4   Vital Signs: Temp: 98.3 F (36.8 C) (10/19 2035) Temp src: Oral (10/19 2035) BP: 115/69 mmHg (10/19 2035) Pulse Rate: 67 (10/19 2035)  Labs:  Recent Labs  04/19/13 0405 04/19/13 1533 04/20/13 04/20/13 0847 04/20/13 0849 04/21/13 0535 04/21/13 1447 04/21/13 2300  HGB 15.3  --   --   --  14.0 13.5  --   --   HCT 43.2  --   --   --  39.2 40.3  --   --   PLT 172  --   --   --  151 165  --   --   APTT 40* 37 45*  --   --   --   --   --   HEPARINUNFRC 1.04* 0.38 0.20*  --  0.45 0.11* <0.10* 0.20*  CREATININE 2.83*  --   --  3.03*  --  2.95*  --   --     Estimated Creatinine Clearance: 33.3 ml/min (by C-G formula based on Cr of 2.95).  Assessment: 57 yo male with Afib s/p DCCV for heparin   Goal of Therapy:  Heparin level 0.3-0.7 Monitor platelets by anticoagulation protocol: Yes   Plan:  Heparin 2000 units IV bolus, then increase heparin 2000 units/hr Follow-up am labs.  Geannie Risen, PharmD, BCPS  04/22/2013 12:07 AM

## 2013-04-22 NOTE — Progress Notes (Addendum)
ANTICOAGULATION CONSULT NOTE - Follow Up Consult  Pharmacy Consult:  Heparin Indication: atrial fibrillation  No Known Allergies  Patient Measurements: Height: 5\' 8"  (172.7 cm) Weight: 248 lb (112.492 kg) (scale c) IBW/kg (Calculated) : 68.4 Heparin Dosing Weight: 93 kg  Vital Signs: Temp: 98 F (36.7 C) (10/20 0456) Temp src: Oral (10/20 0456) BP: 160/67 mmHg (10/20 0456)  Labs:  Recent Labs  04/19/13 1533 04/20/13 04/20/13 0847  04/20/13 0849 04/21/13 0535 04/21/13 1447 04/21/13 2300 04/22/13 0524 04/22/13 0835  HGB  --   --   --   < > 14.0 13.5  --   --  13.6  --   HCT  --   --   --   --  39.2 40.3  --   --  39.7  --   PLT  --   --   --   --  151 165  --   --  193  --   APTT 37 45*  --   --   --   --   --   --   --   --   LABPROT  --   --   --   --   --   --   --   --  13.7  --   INR  --   --   --   --   --   --   --   --  1.07  --   HEPARINUNFRC 0.38 0.20*  --   --  0.45 0.11* <0.10* 0.20* 0.33  --   CREATININE  --   --  3.03*  --   --  2.95*  --   --   --  2.32*  < > = values in this interval not displayed.  Estimated Creatinine Clearance: 42.2 ml/min (by C-G formula based on Cr of 2.32).     Assessment: 57 yo male with Afib currently on IV heparin due to Xarelto being on hold.  Heparin level therapeutic, no bleeding reported.   Goal of Therapy:  Heparin level 0.3-0.7 units/ml Monitor platelets by anticoagulation protocol: Yes    Plan:  - Continue heparin gtt at 2000 units/hr - Daily HL / CBC - F/U post cath    Kida Digiulio D. Laney Potash, PharmD, BCPS Pager:  [phone] 04/22/2013, 10:25 AM   =====================================  Update: will proceed with stress test to assess graft +/- cath pending results.   Plan - Increase heparin gtt slightly to 2050 units/hr - F/U AM labs    Willisha Sligar D. Laney Potash, PharmD, BCPS Pager:  [phone] 04/22/2013, 12:55 PM

## 2013-04-23 ENCOUNTER — Encounter (HOSPITAL_COMMUNITY)
Admission: AD | Disposition: A | Discharge status: Home / Self Care | Payer: Self-pay | Source: Ambulatory Visit | Attending: Internal Medicine

## 2013-04-23 ENCOUNTER — Other Ambulatory Visit: Payer: Self-pay

## 2013-04-23 DIAGNOSIS — I279 Pulmonary heart disease, unspecified: Secondary | ICD-10-CM

## 2013-04-23 HISTORY — PX: RIGHT HEART CATHETERIZATION: SHX5447

## 2013-04-23 LAB — CBC
HCT: 36.1 % — ABNORMAL LOW (ref 39.0–52.0)
Hemoglobin: 12.2 g/dL — ABNORMAL LOW (ref 13.0–17.0)
MCV: 94 fL (ref 78.0–100.0)
Platelets: 161 10*3/uL (ref 150–400)
RDW: 14.9 % (ref 11.5–15.5)
WBC: 9.1 10*3/uL (ref 4.0–10.5)

## 2013-04-23 LAB — HEPARIN LEVEL (UNFRACTIONATED): Heparin Unfractionated: <0.1 IU/mL — ABNORMAL LOW (ref 0.30–0.70)

## 2013-04-23 LAB — GLUCOSE, CAPILLARY
Glucose-Capillary: 147 mg/dL — ABNORMAL HIGH (ref 70–99)
Glucose-Capillary: 128 mg/dL — ABNORMAL HIGH (ref 70–99)
Glucose-Capillary: 159 mg/dL — ABNORMAL HIGH (ref 70–99)

## 2013-04-23 SURGERY — RIGHT HEART CATH
Anesthesia: LOCAL

## 2013-04-23 MED ORDER — MIDAZOLAM HCL 2 MG/2ML IJ SOLN
INTRAMUSCULAR | Status: AC
  Filled 2013-04-23: qty 2

## 2013-04-23 MED ORDER — SODIUM CHLORIDE 0.9 % IJ SOLN: 3.0000 mL | INTRAMUSCULAR | Status: DC | PRN

## 2013-04-23 MED ORDER — SODIUM CHLORIDE 0.9 % IJ SOLN: 3.0000 mL | Freq: Two times a day (BID) | INTRAMUSCULAR | Status: DC

## 2013-04-23 MED ORDER — HEPARIN (PORCINE) IN NACL 100-0.45 UNIT/ML-% IJ SOLN
2550.0000 [IU]/h | INTRAMUSCULAR | Status: DC
  Administered 2013-04-23 (×2): 2350 [IU]/h via INTRAVENOUS
  Administered 2013-04-24 – 2013-04-26 (×2): 2550 [IU]/h via INTRAVENOUS
  Filled 2013-04-23 (×6): qty 250

## 2013-04-23 MED ORDER — FUROSEMIDE 10 MG/ML IJ SOLN
20.0000 mg | Freq: Once | INTRAMUSCULAR | Status: AC
  Administered 2013-04-23: 20 mg via INTRAVENOUS
  Filled 2013-04-23 (×2): qty 2

## 2013-04-23 MED ORDER — FUROSEMIDE 10 MG/ML IJ SOLN
5.0000 mg/h | INTRAVENOUS | Status: DC
  Administered 2013-04-23 – 2013-04-24 (×2): 5 mg/h via INTRAVENOUS
  Filled 2013-04-23 (×2): qty 25

## 2013-04-23 MED ORDER — NITROGLYCERIN IN D5W 200-5 MCG/ML-% IV SOLN
2.0000 ug/min | INTRAVENOUS | Status: DC
  Administered 2013-04-23: 5 ug/min via INTRAVENOUS
  Filled 2013-04-23 (×2): qty 250

## 2013-04-23 MED ORDER — HEPARIN (PORCINE) IN NACL 100-0.45 UNIT/ML-% IJ SOLN: 2350.0000 [IU]/h | INTRAMUSCULAR | Status: DC

## 2013-04-23 MED ORDER — LIDOCAINE HCL (PF) 1 % IJ SOLN
INTRAMUSCULAR | Status: AC
  Filled 2013-04-23: qty 30

## 2013-04-23 MED ORDER — HEPARIN (PORCINE) IN NACL 2-0.9 UNIT/ML-% IJ SOLN
INTRAMUSCULAR | Status: AC
  Filled 2013-04-23: qty 500

## 2013-04-23 MED ORDER — SODIUM CHLORIDE 0.9 % IV SOLN: 250.0000 mL | INTRAVENOUS | Status: DC | PRN

## 2013-04-23 NOTE — Progress Notes (Signed)
Both of pt IV out of date, pt informed that new IV needs to be placed. Pt states he wants them changed in the morning. Baron Hamper, RN

## 2013-04-23 NOTE — Progress Notes (Signed)
Subjective: No complaints. Ready to go home.   Objective: Vital signs in last 24 hours: Temp:  [98 F (36.7 C)-98.8 F (37.1 C)] 98.8 F (37.1 C) (10/21 0504) Pulse Rate:  [75-99] 99 (10/21 0504) Resp:  [20-22] 22 (10/21 0504) BP: (133-154)/(65-81) 154/69 mmHg (10/21 0504) SpO2:  [95 %-98 %] 97 % (10/21 0504) Weight:  [245 lb 9.5 oz (111.4 kg)] 245 lb 9.5 oz (111.4 kg) (10/21 0504) Last BM Date: 04/21/13  Intake/Output from previous day: 10/20 0701 - 10/21 0700 In: 720 [P.O.:720] Out: 1600 [Urine:1600] Intake/Output this shift:    Medications Current Facility-Administered Medications  Medication Dose Route Frequency Provider Last Rate Last Dose  . 0.45 % sodium chloride infusion   Intravenous Continuous Eda Paschal Kilroy, PA-C      . 0.9 %  sodium chloride infusion  250 mL Intravenous Continuous Brittainy Simmons, PA-C 1 mL/hr at 04/21/13 0536 250 mL at 04/21/13 0536  . acetaminophen (TYLENOL) tablet 650 mg  650 mg Oral Q4H PRN Abelino Derrick, PA-C   650 mg at 04/22/13 1445  . allopurinol (ZYLOPRIM) tablet 300 mg  300 mg Oral Daily Brittainy Simmons, PA-C   300 mg at 04/22/13 1129  . amiodarone (PACERONE) tablet 400 mg  400 mg Oral BID Marykay Lex, MD   400 mg at 04/22/13 2102  . aspirin EC tablet 81 mg  81 mg Oral Daily Judie Bonus Hammons, RPH   81 mg at 04/22/13 1128  . cholecalciferol (VITAMIN D) tablet 1,000 Units  1,000 Units Oral Daily Judie Bonus Hammons, RPH   1,000 Units at 04/22/13 1128  . citalopram (CELEXA) tablet 20 mg  20 mg Oral Daily Brittainy Simmons, PA-C   20 mg at 04/22/13 1128  . heparin ADULT infusion 100 units/mL (25000 units/250 mL)  2,050 Units/hr Intravenous Continuous [name], MD 20.5 mL/hr at 04/23/13 0420 2,050 Units/hr at 04/23/13 0420  . hydrocortisone cream 1 % 1 application  1 application Topical TID PRN Brittainy Simmons, PA-C      . insulin aspart (novoLOG) injection 0-5 Units  0-5 Units Subcutaneous QHS Luke K Kilroy,  PA-C      . insulin aspart (novoLOG) injection 0-9 Units  0-9 Units Subcutaneous TID WC Abelino Derrick, PA-C   1 Units at 04/23/13 [phone]  . levalbuterol (XOPENEX HFA) inhaler 1-2 puff  1-2 puff Inhalation Q4H PRN Brittainy Simmons, PA-C      . pantoprazole (PROTONIX) EC tablet 20 mg  20 mg Oral Daily Brittainy Simmons, PA-C   20 mg at 04/22/13 1128  . sodium chloride 0.9 % injection 3 mL  3 mL Intravenous Q12H Brittainy Simmons, PA-C   3 mL at 04/21/13 2145  . sodium chloride 0.9 % injection 3 mL  3 mL Intravenous PRN Robbie Lis, PA-C   3 mL at 04/22/13 2102  . zolpidem (AMBIEN) tablet 5 mg  5 mg Oral QHS PRN [name], MD   5 mg at 04/22/13 2110    PE: General appearance: alert, cooperative, no distress and moderately obese Lungs: clear to auscultation bilaterally Heart: regular rate and rhythm, S1, S2 normal, no murmur, click, rub or gallop Extremities: no LEE Pulses: 2+ and symmetric Skin: warm and dry Neurologic: Grossly normal  Lab Results:   Recent Labs  04/21/13 0535 04/22/13 0524 04/23/13 0540  WBC 8.3 10.8* 9.1  HGB 13.5 13.6 12.2*  HCT 40.3 39.7 36.1*  PLT 165 193 161   BMET  Recent Labs  04/20/13 0847 04/21/13 0535 04/22/13 0835  NA 136 138 135  K 4.1 4.1 4.0  CL 98 99 99  CO2 24 23 23   GLUCOSE 233* 132* 134*  BUN 74* 71* 54*  CREATININE 3.03* 2.95* 2.32*  CALCIUM 8.9 8.9 9.1   PT/INR  Recent Labs  04/22/13 0524  LABPROT 13.7  INR 1.07    Studies/Results:  NST 04/22/13  Impression Depressed calculated left ventricular ejection fraction of 37% with near akinesis of the septum and suggestion of dyskinetic apical wall motion. There is evidence of probable scar involving the apex, distal anterior wall and also potentially the basilar inferolateral wall. There is no evidence of inducible myocardial ischemia.   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 Radiologist interpretation is as follows: Depressed calculated left ventricular ejection fraction of 37% with near akinesis of the septum and suggestion of dyskinetic apical wall motion. There is evidence of probable scar involving the apex, distal anterior wall and also potentially the basilar inferolateral wall. There is no evidence of inducible myocardial ischemia.  Cardiologist to also review findings. If agreed that there is no ischemia, then we will still consider a RHC only to assess CO and volume status. He continues in NSR on telemetry. HR in the 90s. Continue PO Amiodarone. He is on IV Heparin. Will need to switch back to oral anticoagulation after RHC. Given renal dysfunction, we may need to consider switching from Xarelto to Coumadin. Will discuss with MD.     LOS: 5 days   Brittainy M. Sharol Harness, PA-C 04/23/2013 8:07 AM  I seen and examined the patient along with the Ms. Sharol Harness, PA-c.  Agree with her findings,, examination and assessment. He still does note exertional dyspnea, but is not orthopneic and does not have significant edema.  Thankfully his chest it did not really show any signs of ischemia simply infarction that was previously described. Based on this result, the need for left heart catheterization is somewhat obviated.  However due to his persistent dyspnea, Dr. Rennis Golden was  still concerned about low output heart failure versus elevated pulmonary pressures.  Will plan to proceed with with right heart cath this afternoon. and to determine the need for diuresis based on those results.  I do not think that he would need any prolonged amount inpatient care.  He will also need outpatient midportion tests ordered, both to assess his lung function with his dyspnea but also as preliminary studies for him being on amiodarone.  Glycemic control is minimal control.  Blood pressures also we'll control.  Can probably restart him on beta blocker.  Would prefer to use a more cardioselective/heart failure beta blocker such as carvedilol.  We'll add low-dose carvedilol.  Renal function seems to have stabilize somewhat.  He remains in sinus rhythm on amiodarone.  He still oral load.  Would keep on a twice a day for a full week from Saturday then 200 twice a day for a week followed by 200 once a day maintenance dosing. The other remaining issue is that we need to readdress the issue of his anticoagulation with his renal function.  Will discuss with pharmacy the best options.  I've explained this benefits alternatives and indications of the right heart catheterization the patient and his significant other.  They do agree to proceed.  Marykay Lex, M.D., M.S. [facility] [address] [place], [place]  [address]  [phone] Pager # [phone] 04/23/2013 12:23 PM

## 2013-04-23 NOTE — Care Management Note (Signed)
    Page 1 of 1   04/23/2013     3:18:55 PM   CARE MANAGEMENT NOTE 04/23/2013  Patient:  [name] [name], [name] [name]   Account Number:  [id number]  Date Initiated:  04/23/2013  Documentation initiated by:  Junius Creamer  Subjective/Objective Assessment:   adm w at fib w rvr     Action/Plan:   lives w friend, pcp dr Sharlot Gowda   Anticipated DC Date:     Anticipated DC Plan:        DC Planning Services  CM consult      Choice offered to / List presented to:             Status of service:   Medicare Important Message given?   (If response is "NO", the following Medicare IM given date fields will be blank) Date Medicare IM given:   Date Additional Medicare IM given:    Discharge Disposition:    Per UR Regulation:  Reviewed for med. necessity/level of care/duration of stay  If discussed at Long Length of Stay Meetings, dates discussed:    Comments:  10/21 1517 debbie Buford Gayler rn,bsn transf to sdu post cath. on lasix and ntg drip. will moniter for dc needs as pt progresses.

## 2013-04-23 NOTE — Progress Notes (Signed)
D/C'd Tele, D/C'd from Unit for Cath procedure, IV still infusing and pt taken to cath lab w/IV still infusing, Pt. Showed no signs of distress.

## 2013-04-23 NOTE — Progress Notes (Signed)
Patient ID: [name] [name], male   DOB: 09-17-1955, 57 y.o.   MRN: [id number] S: NAEON.  Exertional dyspnea persists O:BP 154/69  Pulse 99  Temp(Src) 98.8 F (37.1 C) (Oral)  Resp 22  Ht 5\' 8"  (1.727 m)  Wt 111.4 kg (245 lb 9.5 oz)  BMI 37.35 kg/m2  SpO2 97%  Intake/Output Summary (Last 24 hours) at 04/23/13 1151 Last data filed at 04/23/13 1124  Gross per 24 hour  Intake    720 ml  Output   1400 ml  Net   -680 ml   Intake/Output: I/O last 3 completed shifts: In: 1315.8 [P.O.:960; I.V.:355.8] Out: 2400 [Urine:2400]  Intake/Output this shift:  Total I/O In: 240 [P.O.:240] Out: 0  Weight change: -1.092 kg (-2 lb 6.5 oz) GEN: NAD obese ENT: Thick neck CV: RRR LUNGS CTAB, nl wob ABD: obese, nt, soft EXT: no LEE SKIN w/o rashes NEURO: nonfocal   Recent Labs Lab 04/18/13 1258 04/19/13 0405 04/20/13 0847 04/21/13 0535 04/22/13 0835  NA 137 137 136 138 135  K 3.9 3.6 4.1 4.1 4.0  CL 99 98 98 99 99  CO2 24 23 24 23 23   GLUCOSE 193* 144* 233* 132* 134*  BUN 68* 68* 74* 71* 54*  CREATININE 2.60* 2.83* 3.03* 2.95* 2.32*  ALBUMIN  --   --  3.3* 3.3* 3.3*  CALCIUM 9.3 9.1 8.9 8.9 9.1  PHOS  --  4.5 4.2 4.6 2.4   Liver Function Tests:  Recent Labs Lab 04/20/13 0847 04/21/13 0535 04/22/13 0835  ALBUMIN 3.3* 3.3* 3.3*   No results found for this basename: LIPASE, AMYLASE,  in the last 168 hours No results found for this basename: AMMONIA,  in the last 168 hours CBC:  Recent Labs Lab 04/19/13 0405 04/20/13 0849 04/21/13 0535 04/22/13 0524 04/23/13 0540  WBC 9.1 6.1 8.3 10.8* 9.1  HGB 15.3 14.0 13.5 13.6 12.2*  HCT 43.2 39.2 40.3 39.7 36.1*  MCV 93.5 93.3 94.6 93.9 94.0  PLT 172 151 165 193 161   Cardiac Enzymes: No results found for this basename: CKTOTAL, CKMB, CKMBINDEX, TROPONINI,  in the last 168 hours CBG:  Recent Labs Lab 04/22/13 1125 04/22/13 1704 04/22/13 2050 04/23/13 0553 04/23/13 1104  GLUCAP 130* 123* 126* 147* 128*    Iron  Studies: No results found for this basename: IRON, TIBC, TRANSFERRIN, FERRITIN,  in the last 72 hours Studies/Results: Nm Myocar Multi W/spect W/wall Motion / Ef  04/22/2013   CLINICAL DATA:  Dyspnea, prior CABG and ischemic cardiomyopathy. History of rapid atrial fibrillation with recent conversion to normal sinus rhythm.  EXAM: MYOCARDIAL IMAGING WITH SPECT (REST AND PHARMACOLOGIC-STRESS)  GATED LEFT VENTRICULAR WALL MOTION STUDY  LEFT VENTRICULAR EJECTION FRACTION  TECHNIQUE: Standard myocardial SPECT imaging was performed after resting intravenous injection of 10 mCi Tc-64m sestamibi Subsequently, intravenous infusion of Lexiscan was performed under the supervision of the Cardiology staff. At peak effect of the drug, 30 mCi Tc-34m sestamibi was injected intravenously and standard myocardial SPECT imaging was performed. Quantitative gated imaging was also performed to evaluate left ventricular wall motion, and estimate left ventricular ejection fraction.  COMPARISON:  Report from a prior study dated 05/21/2002. Images from that study is currently unavailable for review.  FINDINGS: Utilizing gated data, the end-diastolic volume is estimated to be 168 mL and the end systolic volume 105 mL. Calculated ejection fraction is 37%.  Wall motion analysis demonstrates a nearly akinetic septum. Focal dyskinetic wall motion is also present at the left ventricular  apex.  SPECT imaging demonstrates apical and distal anterior wall attenuation on both rest and stress imaging suggestive of a prior infarct. There also is suggestion of some inferolateral attenuation towards the base of the left ventricle which may also be consistent with scar. There is no evidence of inducible myocardial ischemia.  IMPRESSION: Depressed calculated left ventricular ejection fraction of 37% with near akinesis of the septum and suggestion of dyskinetic apical wall motion. There is evidence of probable scar involving the apex, distal anterior wall  and also potentially the basilar inferolateral wall. There is no evidence of inducible myocardial ischemia.   Electronically Signed   By: Irish Lack M.D.   On: 04/22/2013 15:57   . allopurinol  300 mg Oral Daily  . amiodarone  400 mg Oral BID  . aspirin EC  81 mg Oral Daily  . cholecalciferol  1,000 Units Oral Daily  . citalopram  20 mg Oral Daily  . insulin aspart  0-5 Units Subcutaneous QHS  . insulin aspart  0-9 Units Subcutaneous TID WC  . pantoprazole  20 mg Oral Daily  . sodium chloride  3 mL Intravenous Q12H    BMET    Component Value Date/Time   NA 135 04/22/2013 0835   K 4.0 04/22/2013 0835   CL 99 04/22/2013 0835   CO2 23 04/22/2013 0835   GLUCOSE 134* 04/22/2013 0835   BUN 54* 04/22/2013 0835   CREATININE 2.32* 04/22/2013 0835   CREATININE 2.60* 04/11/2013 0831   CALCIUM 9.1 04/22/2013 0835   GFRNONAA 29* 04/22/2013 0835   GFRAA 34* 04/22/2013 0835   CBC    Component Value Date/Time   WBC 9.1 04/23/2013 0540   RBC 3.84* 04/23/2013 0540   HGB 12.2* 04/23/2013 0540   HCT 36.1* 04/23/2013 0540   PLT 161 04/23/2013 0540   MCV 94.0 04/23/2013 0540   MCH 31.8 04/23/2013 0540   MCHC 33.8 04/23/2013 0540   RDW 14.9 04/23/2013 0540   LYMPHSABS 2.9 10/09/2012 1227   MONOABS 0.9 10/09/2012 1227   EOSABS 0.0 10/09/2012 1227   BASOSABS 0.0 10/09/2012 1227   Assessment/Plan:  1. AKI/CKD- better with improved hemodynamics and sinus rhythm.  Has CKD4, will f/u with me in clinic.  Ca and Phos are at goal. PTH acceptable.   2. Dyspnea- NST with no reversible findings.  Might still merit RHC +/- LHC per Cardiology.  Risks of CIN in setting of CKD4 discussed at length w pt.  Avoiding nephrotoxins and euvolemia important if contrast required.  ? If patient would benefit from spirometry or lung volumes (Restrictive lung disease?) 3. OSA- on CPAP 4. HTN- stable but could use better control. Consider BB per Dr. Rennis Golden 5. A fib with RVR s/p DCCV 10/17 now in NSR 6. Chronic systolic  HF- per cardiology, will follow 7. DM- per primary svc 8. obestity 9. dyslipidemia  Renata Gambino, B

## 2013-04-23 NOTE — Interval H&P Note (Signed)
History and Physical Interval Note:  04/23/2013 2:34 PM  [name] [name]  has presented today for surgery, with the diagnosis of CHF  The various methods of treatment have been discussed with the patient and family. After consideration of risks, benefits and other options for treatment, the patient has consented to  Procedure(s): RIGHT HEART CATH (N/A) as a surgical intervention .  The patient's history has been reviewed, patient examined, no change in status, stable for surgery.  I have reviewed the patient's chart and labs.  Questions were answered to the patient's satisfaction.     Jontae Adebayo,Huey W

## 2013-04-23 NOTE — Progress Notes (Signed)
Patient successfully transferred from procedural unit. Report given to receiving RN. All patient belongings packed and transferred to patient in new room.

## 2013-04-23 NOTE — Progress Notes (Addendum)
ANTICOAGULATION CONSULT NOTE - Follow Up Consult  Pharmacy Consult:  Heparin Indication: atrial fibrillation  No Known Allergies  Patient Measurements: Height: 5\' 8"  (172.7 cm) Weight: 245 lb 9.5 oz (111.4 kg) IBW/kg (Calculated) : 68.4 Heparin Dosing Weight: 93 kg  Vital Signs: Temp: 98.8 F (37.1 C) (10/21 0504) Temp src: Oral (10/21 0504) BP: 154/69 mmHg (10/21 0504) Pulse Rate: 99 (10/21 0504)  Labs:  Recent Labs  04/20/13 0847  04/21/13 0535  04/21/13 2300 04/22/13 0524 04/22/13 0835 04/23/13 0540  HGB  --   < > 13.5  --   --  13.6  --  12.2*  HCT  --   < > 40.3  --   --  39.7  --  36.1*  PLT  --   < > 165  --   --  193  --  161  LABPROT  --   --   --   --   --  13.7  --   --   INR  --   --   --   --   --  1.07  --   --   HEPARINUNFRC  --   < > 0.11*  < > 0.20* 0.33  --  <0.10*  CREATININE 3.03*  --  2.95*  --   --   --  2.32*  --   < > = values in this interval not displayed.  Estimated Creatinine Clearance: 42 ml/min (by C-G formula based on Cr of 2.32).     Assessment: 57 yo male with Afib currently on IV heparin due to Xarelto being on hold.  S/p stress test on 04/22/13 and further plans unknown for now.  Patient's heparin level was undetectable this morning.  No issues with infusion or IV site per RN.  No bleeding reported.   Goal of Therapy:  Heparin level 0.3-0.7 units/ml Monitor platelets by anticoagulation protocol: Yes    Plan:  - Increase heparin gtt to 2350 units/hr - Check 6 hr HL - Daily HL / CBC    Mischelle Reeg D. Laney Potash, PharmD, BCPS Pager:  [phone] 04/23/2013, 8:37 AM    ============================================  Addendum: - s/p cath and patient transferred to [facility] for IV NTG and IV Lasix infusion - resume IV heparin 4 hrs post sheath removal per order - RN reported sheath removed at 1430, no hematoma, no bleeding    Plan: - At 1830, resume IV heparin at 2350 units/hr - Check 6 hr HL - Monitor for bleeding    Katrin Grabel D.  Laney Potash, PharmD, BCPS Pager:  [phone] 04/23/2013, 3:12 PM

## 2013-04-23 NOTE — Interval H&P Note (Signed)
History and Physical Interval Note:  04/23/2013 12:26 PM  [name] [name]  has presented today for surgery, with the diagnosis of CHF  The various methods of treatment have been discussed with the patient and family. After consideration of risks, benefits and other options for treatment, the patient has consented to  Procedure(s): RIGHT HEART CATH (N/A) as a surgical intervention .  The patient's history has been reviewed, patient examined, no change in status, stable for surgery.  I have reviewed the patient's chart and labs.  Questions were answered to the patient's satisfaction.     Rhesa Forsberg,Reilly W

## 2013-04-23 NOTE — Plan of Care (Signed)
Problem: Phase I Progression Outcomes Goal: Initial discharge plan identified Outcome: Completed/Met Date Met:  04/23/13 Pt to return home

## 2013-04-23 NOTE — H&P (View-Only) (Signed)
  Subjective: No complaints. Ready to go home.   Objective: Vital signs in last 24 hours: Temp:  [98 F (36.7 C)-98.8 F (37.1 C)] 98.8 F (37.1 C) (10/21 0504) Pulse Rate:  [75-99] 99 (10/21 0504) Resp:  [20-22] 22 (10/21 0504) BP: (133-154)/(65-81) 154/69 mmHg (10/21 0504) SpO2:  [95 %-98 %] 97 % (10/21 0504) Weight:  [245 lb 9.5 oz (111.4 kg)] 245 lb 9.5 oz (111.4 kg) (10/21 0504) Last BM Date: 04/21/13  Intake/Output from previous day: 10/20 0701 - 10/21 0700 In: 720 [P.O.:720] Out: 1600 [Urine:1600] Intake/Output this shift:    Medications Current Facility-Administered Medications  Medication Dose Route Frequency Provider Last Rate Last Dose  . 0.45 % sodium chloride infusion   Intravenous Continuous Luke K Kilroy, PA-C      . 0.9 %  sodium chloride infusion  250 mL Intravenous Continuous Brittainy Simmons, PA-C 1 mL/hr at 04/21/13 0536 250 mL at 04/21/13 0536  . acetaminophen (TYLENOL) tablet 650 mg  650 mg Oral Q4H PRN Luke K Kilroy, PA-C   650 mg at 04/22/13 1445  . allopurinol (ZYLOPRIM) tablet 300 mg  300 mg Oral Daily Brittainy Simmons, PA-C   300 mg at 04/22/13 1129  . amiodarone (PACERONE) tablet 400 mg  400 mg Oral BID Jakobe W Harding, MD   400 mg at 04/22/13 2102  . aspirin EC tablet 81 mg  81 mg Oral Daily Kimberly Ballard Hammons, RPH   81 mg at 04/22/13 1128  . cholecalciferol (VITAMIN D) tablet 1,000 Units  1,000 Units Oral Daily Kimberly Ballard Hammons, RPH   1,000 Units at 04/22/13 1128  . citalopram (CELEXA) tablet 20 mg  20 mg Oral Daily Brittainy Simmons, PA-C   20 mg at 04/22/13 1128  . heparin ADULT infusion 100 units/mL (25000 units/250 mL)  2,050 Units/hr Intravenous Continuous Kenneth C. Hilty, MD 20.5 mL/hr at 04/23/13 0420 2,050 Units/hr at 04/23/13 0420  . hydrocortisone cream 1 % 1 application  1 application Topical TID PRN Brittainy Simmons, PA-C      . insulin aspart (novoLOG) injection 0-5 Units  0-5 Units Subcutaneous QHS Luke K Kilroy,  PA-C      . insulin aspart (novoLOG) injection 0-9 Units  0-9 Units Subcutaneous TID WC Luke K Kilroy, PA-C   1 Units at 04/23/13 0607  . levalbuterol (XOPENEX HFA) inhaler 1-2 puff  1-2 puff Inhalation Q4H PRN Brittainy Simmons, PA-C      . pantoprazole (PROTONIX) EC tablet 20 mg  20 mg Oral Daily Brittainy Simmons, PA-C   20 mg at 04/22/13 1128  . sodium chloride 0.9 % injection 3 mL  3 mL Intravenous Q12H Brittainy Simmons, PA-C   3 mL at 04/21/13 2145  . sodium chloride 0.9 % injection 3 mL  3 mL Intravenous PRN Brittainy Simmons, PA-C   3 mL at 04/22/13 2102  . zolpidem (AMBIEN) tablet 5 mg  5 mg Oral QHS PRN Kenneth C. Hilty, MD   5 mg at 04/22/13 2110    PE: General appearance: alert, cooperative, no distress and moderately obese Lungs: clear to auscultation bilaterally Heart: regular rate and rhythm, S1, S2 normal, no murmur, click, rub or gallop Extremities: no LEE Pulses: 2+ and symmetric Skin: warm and dry Neurologic: Grossly normal  Lab Results:   Recent Labs  04/21/13 0535 04/22/13 0524 04/23/13 0540  WBC 8.3 10.8* 9.1  HGB 13.5 13.6 12.2*  HCT 40.3 39.7 36.1*  PLT 165 193 161   BMET  Recent Labs    04/20/13 0847 04/21/13 0535 04/22/13 0835  NA 136 138 135  K 4.1 4.1 4.0  CL 98 99 99  CO2 24 23 23  GLUCOSE 233* 132* 134*  BUN 74* 71* 54*  CREATININE 3.03* 2.95* 2.32*  CALCIUM 8.9 8.9 9.1   PT/INR  Recent Labs  04/22/13 0524  LABPROT 13.7  INR 1.07    Studies/Results:  NST 04/22/13  Impression Depressed calculated left ventricular ejection fraction of 37% with near akinesis of the septum and suggestion of dyskinetic apical wall motion. There is evidence of probable scar involving the apex, distal anterior wall and also potentially the basilar inferolateral wall. There is no evidence of inducible myocardial ischemia.   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 Radiologist interpretation is as follows: Depressed calculated left ventricular ejection fraction of 37% with near akinesis of the septum and suggestion of dyskinetic apical wall motion. There is evidence of probable scar involving the apex, distal anterior wall and also potentially the basilar inferolateral wall. There is no evidence of inducible myocardial ischemia.  Cardiologist to also review findings. If agreed that there is no ischemia, then we will still consider a RHC only to assess CO and volume status. He continues in NSR on telemetry. HR in the 90s. Continue PO Amiodarone. He is on IV Heparin. Will need to switch back to oral anticoagulation after RHC. Given renal dysfunction, we may need to consider switching from Xarelto to Coumadin. Will discuss with MD.     LOS: 5 days   Brittainy M. Simmons, PA-C 04/23/2013 8:07 AM  I seen and examined the patient along with the Ms. Simmons, PA-c.  Agree with her findings,, examination and assessment. He still does note exertional dyspnea, but is not orthopneic and does not have significant edema.  Thankfully his chest it did not really show any signs of ischemia simply infarction that was previously described. Based on this result, the need for left heart catheterization is somewhat obviated.  However due to his persistent dyspnea, Dr. Hilty was  still concerned about low output heart failure versus elevated pulmonary pressures.  Will plan to proceed with with right heart cath this afternoon. and to determine the need for diuresis based on those results.  I do not think that he would need any prolonged amount inpatient care.  He will also need outpatient midportion tests ordered, both to assess his lung function with his dyspnea but also as preliminary studies for him being on amiodarone.  Glycemic control is minimal control.  Blood pressures also we'll control.  Can probably restart him on beta blocker.  Would prefer to use a more cardioselective/heart failure beta blocker such as carvedilol.  We'll add low-dose carvedilol.  Renal function seems to have stabilize somewhat.  He remains in sinus rhythm on amiodarone.    He still oral load.  Would keep on a twice a day for a full week from Saturday then 200 twice a day for a week followed by 200 once a day maintenance dosing. The other remaining issue is that we need to readdress the issue of his anticoagulation with his renal function.  Will discuss with pharmacy the best options.  I've explained this benefits alternatives and indications of the right heart catheterization the patient and his significant other.  They do agree to proceed.  HARDING,Torrence W, M.D., M.S. [facility] [address] [place], [place]  [address]  [phone] Pager # [phone] 04/23/2013 12:23 PM    

## 2013-04-23 NOTE — CV Procedure (Signed)
RIGHT HEART CATHETERIZATION  NAME:  [name] [name]   MRN: [id number] DOB:  1955/07/05   ADMIT DATE: 04/18/2013  Procedure: Right Heart Catheterization Indications: Assessment of intravascular volume 57 year old gentleman with ischemic cardiomyopathy, status post CABG with new adduction and ejection fraction as well as recurrent atrial fibrillation, who was admitted due to aggressively worsening dyspnea.  He was noted in the clinic to be in A. fib with RVR.  Initial plans were amiodarone bolus followed by cardioversion.  Noted also noted to have acute on chronic renal insufficiency with his single kidney.  Initial plans of right and left heart catheterization on Monday, 04/22/2013 were postponed due to his renal insufficiency.  He underwent nuclear stress testing yesterday which did not show any evidence of ischemia but EF of estimated 37% which correlates well with his ejection fraction by echo 30-40%.  There was evidence of infarction but not ongoing ischemia.  A likely thought process is that he has had some graft shutdown.  Despite being restored sinus rhythm, and improvement in his renal function, he continues to note dyspnea on exertion beyond his normal baseline. I discussed his case with Dr. Rennis Golden, who recommended proceeding later catheterization today to better delineate his volume status.  Procedure Details:  Consent: Risks of procedure as well as the alternatives and risks of each were explained to the (patient/caregiver).  Consent for procedure obtained.  Time Out: Verified patient identification, verified procedure, site/side was marked, verified correct patient position, special equipment/implants available, medications/allergies/relevent history reviewed, required imaging and test results available.  Performed  Sterile technique was used including antiseptics, cap, gloves, gown, hand hygiene, mask and sheet. Skin prep: Chlorhexidine; 15 ml of 1% lidocaine.  A antimicrobial  bonded/coated single lumen sheath was placed in the right femoral vein due to difficult anatomy for IJ & CKD 3-4, using the Seldinger technique.  Then a 7 Fr Theone Murdoch Catheter was advanced through the sheath, and with the balloon inflated, was advanced under fluoroscopic guidance into the first the Right Atrium, then through the Right Ventricle into the Main Pulmonary Artery and into the Wedge position.  Hemodynamics measurements were obtained in each location. Simultaneous Oxygen saturation measurements were recorded in both the Pulmonary Artery and the (Femoral / Radial) Artery. Thermodilution injections were performed to calculate Cardiac Output. IV NTG was administered & measurements were re-sampled.  Then the catheter was not left with the balloon deflated in the Pulmonary Artery.  (The catheter was then removed completely out of the body with the balloon deflated.)  EBL:  < 10 ml Complications: No apparent complications Patient did tolerate procedure well. Chest X-ray ordered to verify placemen if left in placet.  CXR: not required.  Findings:  SaO2% Pressures mmHg (pre-post NTG) Mean P mmHg Pre-post NTG EDP mmHg Pre-post NTG      Right Atrium   19/22 \ 16/17    17  14        Right Ventricle   52/5   22       Pulmonary Artery  55   56/29  51/28   40  37        PCWP   37/44 31/39   39  33        Cuff pressure    165/87      Cardiac Output:  Cardiac Index:       Fick 4.99    2.23        Thermodilution  5.17    2.31  Impression:  Severely elevated LVEDP, likely secondary to combination of systolic and diastolic heart failure.  Secondary Pulmonary Hypertension, Moderate  Plan: I discussed the findings with Dr. Pamella Pert and Dr. Rennis Golden, the general feeling is that we definitely need to provide afterload reduction and diuresis.  He will likely need a followup right heart catheterization after treatment.   Transferred to TCU for IV nitroglycerin infusion in  addition to IV Lasix bolus with infusion   Merlen Gurry,[place]inn[place] W, M.D., M.S. [facility] [address] [place], [place]  [address]  [phone]  04/23/2013 2:42 PM

## 2013-04-23 NOTE — Progress Notes (Signed)
Co-signed for LaTisha Teasley RN/BSN for assessments, IV assessments, medication administration, I's and O's, vital signs, progress notes, and care plans. Limuel Nieblas M, RN/BSN 

## 2013-04-24 ENCOUNTER — Encounter (HOSPITAL_COMMUNITY): Payer: Managed Care, Other (non HMO)

## 2013-04-24 LAB — RENAL FUNCTION PANEL
Albumin: 3.3 g/dL — ABNORMAL LOW (ref 3.5–5.2)
BUN: 34 mg/dL — ABNORMAL HIGH (ref 6–23)
Calcium: 9.5 mg/dL (ref 8.4–10.5)
Chloride: 94 mEq/L — ABNORMAL LOW (ref 96–112)
GFR calc Af Amer: 39 mL/min — ABNORMAL LOW (ref >90)
GFR calc non Af Amer: 34 mL/min — ABNORMAL LOW (ref >90)
Glucose, Bld: 209 mg/dL — ABNORMAL HIGH (ref 70–99)
Phosphorus: 3.4 mg/dL (ref 2.3–4.6)
Potassium: 3.7 mEq/L (ref 3.5–5.1)
Sodium: 135 mEq/L (ref 135–145)

## 2013-04-24 LAB — HEPARIN LEVEL (UNFRACTIONATED)
Heparin Unfractionated: 0.19 IU/mL — ABNORMAL LOW (ref 0.30–0.70)
Heparin Unfractionated: 0.4 IU/mL (ref 0.30–0.70)

## 2013-04-24 LAB — POCT I-STAT 3, VENOUS BLOOD GAS (G3P V)
Acid-base deficit: 3 mmol/L — ABNORMAL HIGH (ref 0.0–2.0)
O2 Saturation: 55 %
TCO2: 22 mmol/L (ref 0–100)
pCO2, Ven: 34.1 mmHg — ABNORMAL LOW (ref 45.0–50.0)

## 2013-04-24 LAB — CBC
HCT: 36.3 % — ABNORMAL LOW (ref 39.0–52.0)
Hemoglobin: 12.5 g/dL — ABNORMAL LOW (ref 13.0–17.0)
MCHC: 34.4 g/dL (ref 30.0–36.0)
Platelets: 159 10*3/uL (ref 150–400)
RDW: 14.7 % (ref 11.5–15.5)
WBC: 7.9 10*3/uL (ref 4.0–10.5)

## 2013-04-24 LAB — GLUCOSE, CAPILLARY
Glucose-Capillary: 119 mg/dL — ABNORMAL HIGH (ref 70–99)
Glucose-Capillary: 133 mg/dL — ABNORMAL HIGH (ref 70–99)
Glucose-Capillary: 133 mg/dL — ABNORMAL HIGH (ref 70–99)
Glucose-Capillary: 206 mg/dL — ABNORMAL HIGH (ref 70–99)

## 2013-04-24 LAB — POCT ACTIVATED CLOTTING TIME: Activated Clotting Time: 145 seconds

## 2013-04-24 MED ORDER — BUSPIRONE HCL 15 MG PO TABS
15.0000 mg | ORAL_TABLET | Freq: Two times a day (BID) | ORAL | Status: DC | PRN
  Administered 2013-04-24 – 2013-04-26 (×4): 15 mg via ORAL
  Filled 2013-04-24 (×5): qty 1

## 2013-04-24 MED ORDER — HYDRALAZINE HCL 25 MG PO TABS
25.0000 mg | ORAL_TABLET | Freq: Three times a day (TID) | ORAL | Status: DC
  Administered 2013-04-24 – 2013-04-26 (×6): 25 mg via ORAL
  Filled 2013-04-24 (×10): qty 1

## 2013-04-24 MED ORDER — AMLODIPINE BESYLATE 5 MG PO TABS
5.0000 mg | ORAL_TABLET | Freq: Every day | ORAL | Status: DC
  Administered 2013-04-24 – 2013-04-27 (×4): 5 mg via ORAL
  Filled 2013-04-24 (×4): qty 1

## 2013-04-24 MED ORDER — METOPROLOL TARTRATE 12.5 MG HALF TABLET
12.5000 mg | ORAL_TABLET | Freq: Two times a day (BID) | ORAL | Status: DC
  Administered 2013-04-24 – 2013-04-27 (×7): 12.5 mg via ORAL
  Filled 2013-04-24 (×8): qty 1

## 2013-04-24 NOTE — Progress Notes (Signed)
ANTICOAGULATION CONSULT NOTE - Follow Up Consult  Pharmacy Consult for Heparin  Indication: atrial fibrillation  No Known Allergies  Patient Measurements: Height: 5\' 8"  (172.7 cm) Weight: 245 lb 9.5 oz (111.4 kg) IBW/kg (Calculated) : 68.4 Heparin Dosing Weight: 93 kg  Vital Signs: Temp: 98.1 F (36.7 C) (10/21 2340) Temp src: Oral (10/21 2340) BP: 158/70 mmHg (10/22 0000) Pulse Rate: 73 (10/22 0000)  Labs:  Recent Labs  04/21/13 0535  04/22/13 0524 04/22/13 0835 04/23/13 0540 04/24/13 0132  HGB 13.5  --  13.6  --  12.2* 12.5*  HCT 40.3  --  39.7  --  36.1* 36.3*  PLT 165  --  193  --  161 159  LABPROT  --   --  13.7  --   --   --   INR  --   --  1.07  --   --   --   HEPARINUNFRC 0.11*  < > 0.33  --  <0.10* 0.19*  CREATININE 2.95*  --   --  2.32*  --   --   < > = values in this interval not displayed.  Estimated Creatinine Clearance: 42 ml/min (by C-G formula based on Cr of 2.32).   Medications:  -Heparin at 2350 units/hr  Assessment: 57 y/o M on heparin for afib, s/p right heart cath, Hgb 12.5, Plts 159, Scr 2.32 from 10/20, no overt bleeding noted. HL is 0.19.   Goal of Therapy:  Heparin level 0.3-0.7 units/ml Monitor platelets by anticoagulation protocol: Yes   Plan:  -Increase heparin drip to 2550 units/hr -8 hour HL at 1130 -Daily CBC/HL -Monitor for bleeding  Thank you for allowing me to take part in this patient's care,  Abran Duke, PharmD Clinical Pharmacist Phone: [phone] Pager: [phone] 04/24/2013 2:49 AM

## 2013-04-24 NOTE — Progress Notes (Signed)
ANTICOAGULATION CONSULT NOTE Pharmacy Consult for Heparin Indication: atrial fibrillation  No Known Allergies  Patient Measurements: Height: 5\' 8"  (172.7 cm) Weight: 245 lb 9.5 oz (111.4 kg) IBW/kg (Calculated) : 68.4 Heparin Dosing Weight: 93 kg  Vital Signs: Temp: 97.4 F (36.3 C) (10/22 2000) Temp src: Oral (10/22 2000) BP: 141/54 mmHg (10/22 2000) Pulse Rate: 80 (10/22 1708)  Labs:  Recent Labs  04/22/13 0524 04/22/13 0835 04/23/13 0540 04/24/13 0132 04/24/13 0947 04/24/13 1600 04/24/13 2203  HGB 13.6  --  12.2* 12.5*  --   --   --   HCT 39.7  --  36.1* 36.3*  --   --   --   PLT 193  --  161 159  --   --   --   LABPROT 13.7  --   --   --   --   --   --   INR 1.07  --   --   --   --   --   --   HEPARINUNFRC 0.33  --  <0.10* 0.19*  --  0.40 0.45  CREATININE  --  2.32*  --   --  2.06*  --   --     Estimated Creatinine Clearance: 47.3 ml/min (by C-G formula based on Cr of 2.06).  Assessment: 57 year old male with atrial fibrillation for Heparin.    Goal of Therapy:  Heparin level 0.3-0.7 units/ml Monitor platelets by anticoagulation protocol: Yes   Plan:  Continue Heparin at current rate  Geannie Risen, PharmD, BCPS  04/24/2013, 10:57 PM

## 2013-04-24 NOTE — Progress Notes (Signed)
ANTICOAGULATION CONSULT NOTE - Follow Up Consult  Pharmacy Consult for Heparin Indication: atrial fibrillation  No Known Allergies  Patient Measurements: Height: 5\' 8"  (172.7 cm) Weight: 245 lb 9.5 oz (111.4 kg) IBW/kg (Calculated) : 68.4 Heparin Dosing Weight: 93 kg  Vital Signs: Temp: 98 F (36.7 C) (10/22 1708) Temp src: Oral (10/22 1708) BP: 130/60 mmHg (10/22 1708) Pulse Rate: 80 (10/22 1708)  Labs:  Recent Labs  04/22/13 0524 04/22/13 0835 04/23/13 0540 04/24/13 0132 04/24/13 0947 04/24/13 1600  HGB 13.6  --  12.2* 12.5*  --   --   HCT 39.7  --  36.1* 36.3*  --   --   PLT 193  --  161 159  --   --   LABPROT 13.7  --   --   --   --   --   INR 1.07  --   --   --   --   --   HEPARINUNFRC 0.33  --  <0.10* 0.19*  --  0.40  CREATININE  --  2.32*  --   --  2.06*  --     Estimated Creatinine Clearance: 47.3 ml/min (by C-G formula based on Cr of 2.06).   Medications:  Infusions:  . furosemide (LASIX) infusion 5 mg/hr (04/24/13 1600)  . heparin 2,550 Units/hr (04/24/13 1600)  . nitroGLYCERIN 5 mcg/min (04/24/13 1600)    Assessment: 57 year old male with atrial fibrillation currently receiving anticoagulation with Heparin.  His heparin level is now therapeutic after rate adjustment.  Note oral anticoagulation will likely be restarted after cardiac cath on Friday 10/24.  Goal of Therapy:  Heparin level 0.3-0.7 units/ml Monitor platelets by anticoagulation protocol: Yes   Plan:  Continue Heparin at 2550 units/hr Recheck heparin level in 6 hours to confirm  Estella Husk, Pharm.D., BCPS, AAHIVP Clinical Pharmacist Phone: [phone] or [phone] 04/24/2013, 5:20 PM

## 2013-04-24 NOTE — Progress Notes (Signed)
Subjective:  Frustrated, no change in symptoms despite 2L diuresis yesterday.  Objective:  Vital Signs in the last 24 hours: Temp:  [97.5 F (36.4 C)-98.3 F (36.8 C)] 98.2 F (36.8 C) (10/22 0700) Pulse Rate:  [26-91] 91 (10/22 0848) Resp:  [16-26] 25 (10/22 0848) BP: (131-180)/(49-117) 174/76 mmHg (10/22 0848) SpO2:  [92 %-100 %] 99 % (10/22 0848)  Intake/Output from previous day:  Intake/Output Summary (Last 24 hours) at 04/24/13 0851 Last data filed at 04/24/13 0800  Gross per 24 hour  Intake 685.88 ml  Output   3555 ml  Net -2869.12 ml    Physical Exam: General appearance: alert, cooperative and moderately obese Lungs: clear to auscultation bilaterally Heart: regular rate and rhythm   Rate: 96  Rhythm: normal sinus rhythm  Lab Results:  Recent Labs  04/23/13 0540 04/24/13 0132  WBC 9.1 7.9  HGB 12.2* 12.5*  PLT 161 159    Recent Labs  04/22/13 0835  NA 135  K 4.0  CL 99  CO2 23  GLUCOSE 134*  BUN 54*  CREATININE 2.32*   No results found for this basename: TROPONINI, CK, MB,  in the last 72 hours  Recent Labs  04/22/13 0524  INR 1.07    Imaging: Imaging results have been reviewed  Cardiac Studies:  Assessment/Plan:   Principal Problem:   Atrial fibrillation with RVR- DCCV 04/19/13 to NSR Active Problems:   Acute systolic congestive heart failure, NYHA class 4   Acute on chronic renal insufficiency   Atrophy of left kidney   Chronic anticoagulation   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 Add Hydralazine,  and Norvasc for HTN. Low dose Metoprolol for tachycardia, continue Amiodarone load. Continue IV Lasix and IV NTG. PFTs per Dr Rennis Golden. Start oral anticoagulation after todays BMP back- Xarelto vs Coumadin  Corine Shelter PA-C Beeper [phone] 04/24/2013, 8:51 AM

## 2013-04-24 NOTE — Progress Notes (Signed)
Pt. Seen and examined. Agree with the NP/PA-C note as written.  [name] [name] is very frustrated. He continues to feel bad, despite re-establishing sinus rhythm and diuresis. RHC yesterday shows pulmonary hypertension, mostly venous with markedly elevated LV filling pressures. I agree with diuresis and afterload reduction. I think the issue, however, is coronary. His EF is newly reduced and the most feasible explanation is graft dysfunction. We had a discussion again today about LHC - with significant risk of CIN and possibility of dialysis. He strongly does not want long-term dialysis, but would consider short-term if necessary. He understands we need to make a diagnosis about his coronary status, with possibly our only chance to improve LV function and his symptoms. He is now leaning toward LHC.  I will discuss with nephrology and advanced HF service today to get their opinions.  Will pursue PFT's today as well - he does have a long smoking history.  [name], MD, [facility] Attending Cardiologist [facility]

## 2013-04-24 NOTE — Progress Notes (Signed)
Patient ID: [name] [name], male   DOB: 1956/03/01, 57 y.o.   MRN: [id number] S:no new complaints O:BP 111/75  Pulse 93  Temp(Src) 98.2 F (36.8 C) (Oral)  Resp 24  Ht 5\' 8"  (1.727 m)  Wt 111.4 kg (245 lb 9.5 oz)  BMI 37.35 kg/m2  SpO2 98%  Intake/Output Summary (Last 24 hours) at 04/24/13 0949 Last data filed at 04/24/13 0800  Gross per 24 hour  Intake 445.88 ml  Output   3555 ml  Net -3109.12 ml   Intake/Output: I/O last 3 completed shifts: In: 892.5 [P.O.:480; I.V.:412.5] Out: 3855 [Urine:3855]  Intake/Output this shift:  Total I/O In: 33.4 [I.V.:33.4] Out: 300 [Urine:300] Weight change:  Gen:WD obese WM in NAD CVS:RRR no rub Resp:minimal crackles at bases Abd:+BS, soft, NT WUJ:WJXBJYN edema   Recent Labs Lab 04/18/13 1258 04/19/13 0405 04/20/13 0847 04/21/13 0535 04/22/13 0835  NA 137 137 136 138 135  K 3.9 3.6 4.1 4.1 4.0  CL 99 98 98 99 99  CO2 24 23 24 23 23   GLUCOSE 193* 144* 233* 132* 134*  BUN 68* 68* 74* 71* 54*  CREATININE 2.60* 2.83* 3.03* 2.95* 2.32*  ALBUMIN  --   --  3.3* 3.3* 3.3*  CALCIUM 9.3 9.1 8.9 8.9 9.1  PHOS  --  4.5 4.2 4.6 2.4   Liver Function Tests:  Recent Labs Lab 04/20/13 0847 04/21/13 0535 04/22/13 0835  ALBUMIN 3.3* 3.3* 3.3*   No results found for this basename: LIPASE, AMYLASE,  in the last 168 hours No results found for this basename: AMMONIA,  in the last 168 hours CBC:  Recent Labs Lab 04/20/13 0849 04/21/13 0535 04/22/13 0524 04/23/13 0540 04/24/13 0132  WBC 6.1 8.3 10.8* 9.1 7.9  HGB 14.0 13.5 13.6 12.2* 12.5*  HCT 39.2 40.3 39.7 36.1* 36.3*  MCV 93.3 94.6 93.9 94.0 94.5  PLT 151 165 193 161 159   Cardiac Enzymes: No results found for this basename: CKTOTAL, CKMB, CKMBINDEX, TROPONINI,  in the last 168 hours CBG:  Recent Labs Lab 04/23/13 0553 04/23/13 1104 04/23/13 1739 04/23/13 2137 04/24/13 0726  GLUCAP 147* 128* 159* 122* 119*    Iron Studies: No results found for this basename: IRON,  TIBC, TRANSFERRIN, FERRITIN,  in the last 72 hours Studies/Results: Nm Myocar Multi W/spect W/wall Motion / Ef  04/22/2013   CLINICAL DATA:  Dyspnea, prior CABG and ischemic cardiomyopathy. History of rapid atrial fibrillation with recent conversion to normal sinus rhythm.  EXAM: MYOCARDIAL IMAGING WITH SPECT (REST AND PHARMACOLOGIC-STRESS)  GATED LEFT VENTRICULAR WALL MOTION STUDY  LEFT VENTRICULAR EJECTION FRACTION  TECHNIQUE: Standard myocardial SPECT imaging was performed after resting intravenous injection of 10 mCi Tc-26m sestamibi Subsequently, intravenous infusion of Lexiscan was performed under the supervision of the Cardiology staff. At peak effect of the drug, 30 mCi Tc-55m sestamibi was injected intravenously and standard myocardial SPECT imaging was performed. Quantitative gated imaging was also performed to evaluate left ventricular wall motion, and estimate left ventricular ejection fraction.  COMPARISON:  Report from a prior study dated 05/21/2002. Images from that study is currently unavailable for review.  FINDINGS: Utilizing gated data, the end-diastolic volume is estimated to be 168 mL and the end systolic volume 105 mL. Calculated ejection fraction is 37%.  Wall motion analysis demonstrates a nearly akinetic septum. Focal dyskinetic wall motion is also present at the left ventricular apex.  SPECT imaging demonstrates apical and distal anterior wall attenuation on both rest and stress imaging suggestive of a  prior infarct. There also is suggestion of some inferolateral attenuation towards the base of the left ventricle which may also be consistent with scar. There is no evidence of inducible myocardial ischemia.  IMPRESSION: Depressed calculated left ventricular ejection fraction of 37% with near akinesis of the septum and suggestion of dyskinetic apical wall motion. There is evidence of probable scar involving the apex, distal anterior wall and also potentially the basilar inferolateral  wall. There is no evidence of inducible myocardial ischemia.   Electronically Signed   By: Irish Lack M.D.   On: 04/22/2013 15:57   . allopurinol  300 mg Oral Daily  . amiodarone  400 mg Oral BID  . amLODipine  5 mg Oral Daily  . aspirin EC  81 mg Oral Daily  . cholecalciferol  1,000 Units Oral Daily  . citalopram  20 mg Oral Daily  . hydrALAZINE  25 mg Oral Q8H  . insulin aspart  0-5 Units Subcutaneous QHS  . insulin aspart  0-9 Units Subcutaneous TID WC  . metoprolol tartrate  12.5 mg Oral BID  . pantoprazole  20 mg Oral Daily    BMET    Component Value Date/Time   NA 135 04/22/2013 0835   K 4.0 04/22/2013 0835   CL 99 04/22/2013 0835   CO2 23 04/22/2013 0835   GLUCOSE 134* 04/22/2013 0835   BUN 54* 04/22/2013 0835   CREATININE 2.32* 04/22/2013 0835   CREATININE 2.60* 04/11/2013 0831   CALCIUM 9.1 04/22/2013 0835   GFRNONAA 29* 04/22/2013 0835   GFRAA 34* 04/22/2013 0835   CBC    Component Value Date/Time   WBC 7.9 04/24/2013 0132   RBC 3.84* 04/24/2013 0132   HGB 12.5* 04/24/2013 0132   HCT 36.3* 04/24/2013 0132   PLT 159 04/24/2013 0132   MCV 94.5 04/24/2013 0132   MCH 32.6 04/24/2013 0132   MCHC 34.4 04/24/2013 0132   RDW 14.7 04/24/2013 0132   LYMPHSABS 2.9 10/09/2012 1227   MONOABS 0.9 10/09/2012 1227   EOSABS 0.0 10/09/2012 1227   BASOSABS 0.0 10/09/2012 1227     Assessment/Plan:  1. AKI/CKD- improved with improved hemodynamics and sinus rhythm despite ongoing diuresis.  Are awaiting todays labs but good UOP.  Discussed the risks/benefits of cardiac cath and CIN with possible need for HD.  He is amenable to proceeding with LHC and short-term HD if required.  We discussed the ways to limit the risk of CIN including limit total amount of contrast, stopping lasix 24hours prior to procedure, and gentle hydration with NS 4hrs prior to [facility] and after.  Discussed with Dr. Rennis Golden and will tentatively plan for [facility] on Friday 04/26/13.  2. Dyspnea/CAD- not relieved with  cardioversion and agree with stress test to r/o ischemia. Agree with Dr. Blanchie Dessert assessment of pt's cardiovascular risk given new drop in EF.  Pt aware of risk of CIN if left heart cath indicated but acknowledges the risks of not proceeding with cath and has agreed to go ahead with LHC this week with Dr. Rennis Golden.  See above.  3. OSA- on CPAP 4. HTN- stable but could use better control. Consider BB per Dr. Rennis Golden 5. A fib with RVR s/p DCCV 10/17 now in NSR 6. Chronic systolic HF- diuresed. Will follow 7. DM- per primary svc 8. obestity 9. dyslipidemia 10.   Arnika Larzelere A

## 2013-04-25 ENCOUNTER — Inpatient Hospital Stay (HOSPITAL_COMMUNITY): Payer: Managed Care, Other (non HMO)

## 2013-04-25 LAB — CBC
Hemoglobin: 13 g/dL (ref 13.0–17.0)
MCH: 31.6 pg (ref 26.0–34.0)
MCHC: 33.8 g/dL (ref 30.0–36.0)
MCV: 93.4 fL (ref 78.0–100.0)
RDW: 14.6 % (ref 11.5–15.5)
WBC: 7.9 10*3/uL (ref 4.0–10.5)

## 2013-04-25 LAB — RENAL FUNCTION PANEL
CO2: 22 mEq/L (ref 19–32)
Calcium: 9.5 mg/dL (ref 8.4–10.5)
Chloride: 93 mEq/L — ABNORMAL LOW (ref 96–112)
Creatinine, Ser: 2.55 mg/dL — ABNORMAL HIGH (ref 0.50–1.35)
GFR calc non Af Amer: 26 mL/min — ABNORMAL LOW (ref >90)
Glucose, Bld: 126 mg/dL — ABNORMAL HIGH (ref 70–99)
Phosphorus: 4.3 mg/dL (ref 2.3–4.6)
Sodium: 133 mEq/L — ABNORMAL LOW (ref 135–145)

## 2013-04-25 LAB — HEPARIN LEVEL (UNFRACTIONATED): Heparin Unfractionated: 0.39 IU/mL (ref 0.30–0.70)

## 2013-04-25 LAB — GLUCOSE, CAPILLARY
Glucose-Capillary: 138 mg/dL — ABNORMAL HIGH (ref 70–99)
Glucose-Capillary: 137 mg/dL — ABNORMAL HIGH (ref 70–99)
Glucose-Capillary: 131 mg/dL — ABNORMAL HIGH (ref 70–99)

## 2013-04-25 MED ORDER — ALBUTEROL SULFATE (5 MG/ML) 0.5% IN NEBU
2.5000 mg | INHALATION_SOLUTION | Freq: Once | RESPIRATORY_TRACT | Status: AC
  Administered 2013-04-25: 2.5 mg via RESPIRATORY_TRACT

## 2013-04-25 MED ORDER — SODIUM CHLORIDE 0.9 % IJ SOLN: 3.0000 mL | INTRAMUSCULAR | Status: DC | PRN

## 2013-04-25 MED ORDER — SODIUM CHLORIDE 0.9 % IJ SOLN
3.0000 mL | Freq: Two times a day (BID) | INTRAMUSCULAR | Status: DC
  Administered 2013-04-25: 3 mL via INTRAVENOUS

## 2013-04-25 MED ORDER — ASPIRIN 81 MG PO CHEW
81.0000 mg | CHEWABLE_TABLET | ORAL | Status: AC
  Administered 2013-04-26: 81 mg via ORAL
  Filled 2013-04-25: qty 1

## 2013-04-25 MED ORDER — SODIUM CHLORIDE 0.9 % IV SOLN: 250.0000 mL | INTRAVENOUS | Status: DC | PRN

## 2013-04-25 MED ORDER — SODIUM CHLORIDE 0.9 % IV SOLN
1.0000 mL/kg/h | INTRAVENOUS | Status: DC
  Administered 2013-04-26: 0.996 mL/kg/h via INTRAVENOUS

## 2013-04-25 NOTE — Progress Notes (Signed)
Patient ID: BRYLEE MCGREAL, male   DOB: [birth year], 57 y.o.   MRN: [id number] S:no new complaints O:BP 154/70  Pulse 83  Temp(Src) 98 F (36.7 C) (Oral)  Resp 19  Ht 5\' 8"  (1.727 m)  Wt 111.4 kg (245 lb 9.5 oz)  BMI 37.35 kg/m2  SpO2 95%  Intake/Output Summary (Last 24 hours) at 04/25/13 0848 Last data filed at 04/25/13 0800  Gross per 24 hour  Intake 1858.28 ml  Output   1650 ml  Net 208.28 ml   Intake/Output: I/O last 3 completed shifts: In: 2462.2 [P.O.:1340; I.V.:1122.2] Out: 4885 [Urine:4885]  Intake/Output this shift:  Total I/O In: 32 [I.V.:32] Out: -  Weight change:  Gen:WD obese WM in nad CVS:no rub Resp:cta WUJ:WJXBJY Ext:tr edema   Recent Labs Lab 04/18/13 1258 04/19/13 0405 04/20/13 0847 04/21/13 0535 04/22/13 0835 04/24/13 0947 04/25/13 0550  NA 137 137 136 138 135 135 133*  K 3.9 3.6 4.1 4.1 4.0 3.7 4.1  CL 99 98 98 99 99 94* 93*  CO2 24 23 24 23 23 24 22   GLUCOSE 193* 144* 233* 132* 134* 209* 126*  BUN 68* 68* 74* 71* 54* 34* 41*  CREATININE 2.60* 2.83* 3.03* 2.95* 2.32* 2.06* 2.55*  ALBUMIN  --   --  3.3* 3.3* 3.3* 3.3* 3.0*  CALCIUM 9.3 9.1 8.9 8.9 9.1 9.5 9.5  PHOS  --  4.5 4.2 4.6 2.4 3.4 4.3   Liver Function Tests:  Recent Labs Lab 04/22/13 0835 04/24/13 0947 04/25/13 0550  ALBUMIN 3.3* 3.3* 3.0*   No results found for this basename: LIPASE, AMYLASE,  in the last 168 hours No results found for this basename: AMMONIA,  in the last 168 hours CBC:  Recent Labs Lab 04/21/13 0535 04/22/13 0524 04/23/13 0540 04/24/13 0132 04/25/13 0550  WBC 8.3 10.8* 9.1 7.9 7.9  HGB 13.5 13.6 12.2* 12.5* 13.0  HCT 40.3 39.7 36.1* 36.3* 38.5*  MCV 94.6 93.9 94.0 94.5 93.4  PLT 165 193 161 159 240   Cardiac Enzymes: No results found for this basename: CKTOTAL, CKMB, CKMBINDEX, TROPONINI,  in the last 168 hours CBG:  Recent Labs Lab 04/24/13 1128 04/24/13 1711 04/24/13 1943 04/24/13 2350 04/25/13 0755  GLUCAP 150* 133* 206* 133*  138*    Iron Studies: No results found for this basename: IRON, TIBC, TRANSFERRIN, FERRITIN,  in the last 72 hours Studies/Results: No results found. Marland Kitchen allopurinol  300 mg Oral Daily  . amiodarone  400 mg Oral BID  . amLODipine  5 mg Oral Daily  . aspirin EC  81 mg Oral Daily  . cholecalciferol  1,000 Units Oral Daily  . citalopram  20 mg Oral Daily  . hydrALAZINE  25 mg Oral Q8H  . insulin aspart  0-5 Units Subcutaneous QHS  . insulin aspart  0-9 Units Subcutaneous TID WC  . metoprolol tartrate  12.5 mg Oral BID  . pantoprazole  20 mg Oral Daily    BMET    Component Value Date/Time   NA 133* 04/25/2013 0550   K 4.1 04/25/2013 0550   CL 93* 04/25/2013 0550   CO2 22 04/25/2013 0550   GLUCOSE 126* 04/25/2013 0550   BUN 41* 04/25/2013 0550   CREATININE 2.55* 04/25/2013 0550   CREATININE 2.60* 04/11/2013 0831   CALCIUM 9.5 04/25/2013 0550   GFRNONAA 26* 04/25/2013 0550   GFRAA 30* 04/25/2013 0550   CBC    Component Value Date/Time   WBC 7.9 04/25/2013 0550   RBC 4.12*  04/25/2013 0550   HGB 13.0 04/25/2013 0550   HCT 38.5* 04/25/2013 0550   PLT 240 04/25/2013 0550   MCV 93.4 04/25/2013 0550   MCH 31.6 04/25/2013 0550   MCHC 33.8 04/25/2013 0550   RDW 14.6 04/25/2013 0550   LYMPHSABS 2.9 10/09/2012 1227   MONOABS 0.9 10/09/2012 1227   EOSABS 0.0 10/09/2012 1227   BASOSABS 0.0 10/09/2012 1227     Assessment/Plan:  1. AKI/CKD- improved with improved hemodynamics and sinus rhythm but then rose today.  Likely due to ongoing diuresis. Have stopped lasix but gtt was still running an hour and a half after it was ordered to stop.  Pressed pause.  Will follow UOP and Scr tomorrow before cath.  Hopefully Scr will improve off diuretics, however would postpone LHC if Scr cont to climb.   2. Hyponatremia- likely due to overdiuresis given increase in BUN/Cr and net negative 3L diuresis on Monday.  Will hold lasix and follow. 3. Dyspnea/CAD- not relieved with cardioversion and agree with  stress test to r/o ischemia. Agree with Dr. Blanchie Dessert assessment of pt's cardiovascular risk given new drop in EF. Pt aware of risk of CIN if left heart cath indicated but acknowledges the risks of not proceeding with cath and has agreed to go ahead with LHC this week with Dr. Rennis Golden. See above.  4. OSA- on CPAP 5. HTN- stable but could use better control. Consider BB per Dr. Rennis Golden 6. A fib with RVR s/p DCCV 10/17 now in NSR 7. Chronic systolic HF- diuresed. Will follow 8. DM- per primary svc 9. obestity 10. dyslipidemia 11.   Aava Deland A

## 2013-04-25 NOTE — Progress Notes (Signed)
ANTICOAGULATION CONSULT NOTE Pharmacy Consult for Heparin Indication: atrial fibrillation  No Known Allergies  Patient Measurements: Height: 5\' 8"  (172.7 cm) Weight: 245 lb 9.5 oz (111.4 kg) IBW/kg (Calculated) : 68.4 Heparin Dosing Weight: 93 kg  Vital Signs: Temp: 98 F (36.7 C) (10/23 0756) Temp src: Oral (10/23 0756) BP: 154/70 mmHg (10/23 0756) Pulse Rate: 83 (10/23 0756)  Labs:  Recent Labs  04/23/13 0540 04/24/13 0132 04/24/13 0947 04/24/13 1600 04/24/13 2203 04/25/13 0550  HGB 12.2* 12.5*  --   --   --  13.0  HCT 36.1* 36.3*  --   --   --  38.5*  PLT 161 159  --   --   --  240  HEPARINUNFRC <0.10* 0.19*  --  0.40 0.45 0.39  CREATININE  --   --  2.06*  --   --  2.55*    Estimated Creatinine Clearance: 38.2 ml/min (by C-G formula based on Cr of 2.55).  Assessment: 57 year old male with atrial fibrillation on IV heparin which continues to be at goal. No bleeding noted, cbc stable. Planning LHC in am.  Goal of Therapy:  Heparin level 0.3-0.7 units/ml Monitor platelets by anticoagulation protocol: Yes   Plan:  Continue Heparin at current rate  Sheppard Coil PharmD., BCPS Clinical Pharmacist Pager [phone] 04/25/2013 9:33 AM

## 2013-04-25 NOTE — Progress Notes (Signed)
Subjective: No complaints. Now agrees to [facility]  Objective: Vital signs in last 24 hours: Temp:  [97.4 F (36.3 C)-98.5 F (36.9 C)] 98.5 F (36.9 C) (10/23 0411) Pulse Rate:  [26-93] 80 (10/22 1708) Resp:  [13-28] 17 (10/23 0411) BP: (105-180)/(49-82) 134/82 mmHg (10/23 0411) SpO2:  [93 %-100 %] 96 % (10/23 0411) Last BM Date: 04/24/13  Intake/Output from previous day: 10/22 0701 - 10/23 0700 In: 2099.7 [P.O.:1340; I.V.:759.7] Out: 1950 [Urine:1950] Intake/Output this shift:    Medications Current Facility-Administered Medications  Medication Dose Route Frequency Provider Last Rate Last Dose  . acetaminophen (TYLENOL) tablet 650 mg  650 mg Oral Q4H PRN Abelino Derrick, PA-C   650 mg at 04/24/13 1554  . allopurinol (ZYLOPRIM) tablet 300 mg  300 mg Oral Daily Brittainy Simmons, PA-C   300 mg at 04/24/13 1610  . amiodarone (PACERONE) tablet 400 mg  400 mg Oral BID Marykay Lex, MD   400 mg at 04/24/13 2135  . amLODipine (NORVASC) tablet 5 mg  5 mg Oral Daily Abelino Derrick, PA-C   5 mg at 04/24/13 1553  . aspirin EC tablet 81 mg  81 mg Oral Daily Judie Bonus Hammons, RPH   81 mg at 04/24/13 9604  . busPIRone (BUSPAR) tablet 15 mg  15 mg Oral BID PRN [name], MD   15 mg at 04/24/13 1332  . cholecalciferol (VITAMIN D) tablet 1,000 Units  1,000 Units Oral Daily Judie Bonus Hammons, RPH   1,000 Units at 04/24/13 5409  . citalopram (CELEXA) tablet 20 mg  20 mg Oral Daily Brittainy Simmons, PA-C   20 mg at 04/24/13 8119  . furosemide (LASIX) 250 mg in dextrose 5 % 250 mL infusion  5 mg/hr Intravenous Continuous Marykay Lex, MD 5 mL/hr at 04/24/13 1800 5 mg/hr at 04/24/13 1800  . heparin ADULT infusion 100 units/mL (25000 units/250 mL)  2,550 Units/hr Intravenous Continuous Abran Duke, RPH 25.5 mL/hr at 04/24/13 1800 2,550 Units/hr at 04/24/13 1800  . hydrALAZINE (APRESOLINE) tablet 25 mg  25 mg Oral [address] [place], PA-C   25 mg at 04/25/13 1478  .  hydrocortisone cream 1 % 1 application  1 application Topical TID PRN Brittainy Simmons, PA-C      . insulin aspart (novoLOG) injection 0-5 Units  0-5 Units Subcutaneous QHS Luke K Kilroy, PA-C      . insulin aspart (novoLOG) injection 0-9 Units  0-9 Units Subcutaneous TID WC Abelino Derrick, PA-C   1 Units at 04/24/13 1751  . levalbuterol [facility] HFA) inhaler 1-2 puff  1-2 puff Inhalation Q4H PRN Brittainy Simmons, PA-C      . metoprolol tartrate (LOPRESSOR) tablet 12.5 mg  12.5 mg Oral BID Abelino Derrick, PA-C   12.5 mg at 04/24/13 2135  . nitroGLYCERIN 0.2 mg/mL in dextrose 5 % infusion  2-200 mcg/min Intravenous Titrated Marykay Lex, MD 1.5 mL/hr at 04/24/13 1800 5 mcg/min at 04/24/13 1800  . pantoprazole (PROTONIX) EC tablet 20 mg  20 mg Oral Daily Brittainy Simmons, PA-C   20 mg at 04/24/13 2956  . zolpidem (AMBIEN) tablet 5 mg  5 mg Oral QHS PRN [name], MD   5 mg at 04/25/13 0110    PE: General appearance: alert, cooperative and no distress Lungs: clear to auscultation bilaterally Heart: regular rate and rhythm, S1, S2 normal, no murmur, click, rub or gallop Extremities: no LEE Pulses: 2+ and symmetric Skin: warm and dry Neurologic:  Grossly normal  Lab Results:   Recent Labs  04/23/13 0540 04/24/13 0132 04/25/13 0550  WBC 9.1 7.9 7.9  HGB 12.2* 12.5* 13.0  HCT 36.1* 36.3* 38.5*  PLT 161 159 240   BMET  Recent Labs  04/22/13 0835 04/24/13 0947  NA 135 135  K 4.0 3.7  CL 99 94*  CO2 23 24  GLUCOSE 134* 209*  BUN 54* 34*  CREATININE 2.32* 2.06*  CALCIUM 9.1 9.5    Assessment/Plan    Principal Problem:   Atrial fibrillation with RVR- DCCV 04/19/13 to NSR Active Problems:   Hypertension associated with diabetes   Hyperlipidemia LDL goal <70   Sleep apnea- on C-pap   Depression   Atrophy of left kidney   Mosaicism, 45, 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 Nephrology's recommendations to reduce risk of CIN is to temporarily discontinue Lasix 24 hrs prior to cath, gentle hydration with NS 4hrs prior and after cath and to limit total amount of contrast. Will follow recommendations. He is still maintaining NSR. HR in the 70s. He is still mildly hypertensive w/ SBP in the 150s. ? Increasing BB.  Plan for [facility] tomorrow.     LOS: 7 days    Brittainy M. Delmer Islam 04/25/2013 7:55 AM  I have seen and examined the patient along with Brittainy M. Sharol Harness, PA-C.  I have reviewed the chart, notes and new data.  I agree with PA's note.  No angina at rest. Just finished PFTs, results pending. No dyspnea at rest, but dyspneic with minimal activity.  PLAN: Hold diuretics and plan coronary angio tomorrow. May need staged revascularization to reduce risk of CIN/progression to renal failure.  Thurmon Fair, MD, [facility] [phone] 04/25/2013, 9:19 AM

## 2013-04-26 ENCOUNTER — Encounter (HOSPITAL_COMMUNITY)
Admission: AD | Disposition: A | Discharge status: Home / Self Care | Payer: Self-pay | Source: Ambulatory Visit | Attending: Internal Medicine

## 2013-04-26 DIAGNOSIS — I2510 Atherosclerotic heart disease of native coronary artery without angina pectoris: Secondary | ICD-10-CM

## 2013-04-26 HISTORY — PX: LEFT HEART CATHETERIZATION WITH CORONARY/GRAFT ANGIOGRAM: SHX5450

## 2013-04-26 LAB — CBC
HCT: 36.5 % — ABNORMAL LOW (ref 39.0–52.0)
MCH: 32.5 pg (ref 26.0–34.0)
MCHC: 35.1 g/dL (ref 30.0–36.0)
MCV: 92.6 fL (ref 78.0–100.0)
RDW: 14.4 % (ref 11.5–15.5)

## 2013-04-26 LAB — POCT ACTIVATED CLOTTING TIME: Activated Clotting Time: 135 seconds

## 2013-04-26 LAB — RENAL FUNCTION PANEL
BUN: 44 mg/dL — ABNORMAL HIGH (ref 6–23)
Calcium: 9.1 mg/dL (ref 8.4–10.5)
GFR calc Af Amer: 33 mL/min — ABNORMAL LOW (ref >90)
Glucose, Bld: 122 mg/dL — ABNORMAL HIGH (ref 70–99)
Phosphorus: 4.2 mg/dL (ref 2.3–4.6)
Potassium: 3.4 mEq/L — ABNORMAL LOW (ref 3.5–5.1)

## 2013-04-26 LAB — GLUCOSE, CAPILLARY
Glucose-Capillary: 122 mg/dL — ABNORMAL HIGH (ref 70–99)
Glucose-Capillary: 156 mg/dL — ABNORMAL HIGH (ref 70–99)

## 2013-04-26 LAB — HEPARIN LEVEL (UNFRACTIONATED): Heparin Unfractionated: 0.46 IU/mL (ref 0.30–0.70)

## 2013-04-26 SURGERY — LEFT HEART CATHETERIZATION WITH CORONARY/GRAFT ANGIOGRAM

## 2013-04-26 MED ORDER — HYDRALAZINE HCL 20 MG/ML IJ SOLN
INTRAMUSCULAR | Status: AC
  Filled 2013-04-26: qty 1

## 2013-04-26 MED ORDER — MIDAZOLAM HCL 2 MG/2ML IJ SOLN
INTRAMUSCULAR | Status: AC
  Filled 2013-04-26: qty 2

## 2013-04-26 MED ORDER — SODIUM CHLORIDE 0.9 % IJ SOLN: 3.0000 mL | Freq: Two times a day (BID) | INTRAMUSCULAR | Status: DC

## 2013-04-26 MED ORDER — RIVAROXABAN 15 MG PO TABS
15.0000 mg | ORAL_TABLET | Freq: Every day | ORAL | Status: DC
  Filled 2013-04-26: qty 1

## 2013-04-26 MED ORDER — FENTANYL CITRATE 0.05 MG/ML IJ SOLN
INTRAMUSCULAR | Status: AC
  Filled 2013-04-26: qty 2

## 2013-04-26 MED ORDER — HYDRALAZINE HCL 50 MG PO TABS
50.0000 mg | ORAL_TABLET | Freq: Three times a day (TID) | ORAL | Status: DC
  Administered 2013-04-26 – 2013-04-27 (×4): 50 mg via ORAL
  Filled 2013-04-26 (×6): qty 1

## 2013-04-26 MED ORDER — MORPHINE SULFATE 2 MG/ML IJ SOLN: 2.0000 mg | INTRAMUSCULAR | Status: DC | PRN

## 2013-04-26 MED ORDER — POTASSIUM CHLORIDE CRYS ER 20 MEQ PO TBCR
40.0000 meq | EXTENDED_RELEASE_TABLET | ORAL | Status: AC
  Administered 2013-04-26: 40 meq via ORAL
  Filled 2013-04-26: qty 2

## 2013-04-26 MED ORDER — SODIUM CHLORIDE 0.9 % IJ SOLN: 3.0000 mL | INTRAMUSCULAR | Status: DC | PRN

## 2013-04-26 MED ORDER — SODIUM CHLORIDE 0.9 % IV SOLN
1.0000 mL/kg/h | INTRAVENOUS | Status: AC
  Administered 2013-04-26: 1 mL/kg/h via INTRAVENOUS

## 2013-04-26 MED ORDER — LIDOCAINE HCL (PF) 1 % IJ SOLN
INTRAMUSCULAR | Status: AC
  Filled 2013-04-26: qty 30

## 2013-04-26 MED ORDER — SODIUM CHLORIDE 0.9 % IV SOLN: 250.0000 mL | INTRAVENOUS | Status: DC | PRN

## 2013-04-26 MED ORDER — HEPARIN (PORCINE) IN NACL 2-0.9 UNIT/ML-% IJ SOLN
INTRAMUSCULAR | Status: AC
  Filled 2013-04-26: qty 1000

## 2013-04-26 MED ORDER — ONDANSETRON HCL 4 MG/2ML IJ SOLN: 4.0000 mg | Freq: Four times a day (QID) | INTRAMUSCULAR | Status: DC | PRN

## 2013-04-26 MED ORDER — NITROGLYCERIN 0.2 MG/ML ON CALL CATH LAB
INTRAVENOUS | Status: AC
  Filled 2013-04-26: qty 1

## 2013-04-26 MED ORDER — FUROSEMIDE 10 MG/ML IJ SOLN
40.0000 mg | Freq: Two times a day (BID) | INTRAMUSCULAR | Status: DC
  Administered 2013-04-27: 40 mg via INTRAVENOUS
  Filled 2013-04-26 (×2): qty 4

## 2013-04-26 NOTE — Progress Notes (Signed)
Patient ID: [name] [name], male   DOB: 1956-06-10, 57 y.o.   MRN: [id number] S:feels well, no new complaints and is aware of LHC results O:BP 134/56  Pulse 74  Temp(Src) 98.8 F (37.1 C) (Oral)  Resp 17  Ht 5\' 8"  (1.727 m)  Wt 109.7 kg (241 lb 13.5 oz)  BMI 36.78 kg/m2  SpO2 95%  Intake/Output Summary (Last 24 hours) at 04/26/13 1401 Last data filed at 04/26/13 1300  Gross per 24 hour  Intake 1651.9 ml  Output   1525 ml  Net  126.9 ml   Intake/Output: I/O last 3 completed shifts: In: 2319.4 [P.O.:1040; I.V.:1279.4] Out: 2750 [Urine:2750]  Intake/Output this shift:  Total I/O In: 363.5 [I.V.:363.5] Out: 400 [Urine:400] Weight change:  Gen:WD obese WM in NAd CVS:no rub Resp:decreased BS WUJ:WJXBJ Ext:tr edema   Recent Labs Lab 04/20/13 0847 04/21/13 0535 04/22/13 0835 04/24/13 0947 04/25/13 0550 04/26/13 0500  NA 136 138 135 135 133* 135  K 4.1 4.1 4.0 3.7 4.1 3.4*  CL 98 99 99 94* 93* 97  CO2 24 23 23 24 22 24   GLUCOSE 233* 132* 134* 209* 126* 122*  BUN 74* 71* 54* 34* 41* 44*  CREATININE 3.03* 2.95* 2.32* 2.06* 2.55* 2.35*  ALBUMIN 3.3* 3.3* 3.3* 3.3* 3.0* 2.9*  CALCIUM 8.9 8.9 9.1 9.5 9.5 9.1  PHOS 4.2 4.6 2.4 3.4 4.3 4.2   Liver Function Tests:  Recent Labs Lab 04/24/13 0947 04/25/13 0550 04/26/13 0500  ALBUMIN 3.3* 3.0* 2.9*   No results found for this basename: LIPASE, AMYLASE,  in the last 168 hours No results found for this basename: AMMONIA,  in the last 168 hours CBC:  Recent Labs Lab 04/22/13 0524 04/23/13 0540 04/24/13 0132 04/25/13 0550 04/26/13 0500  WBC 10.8* 9.1 7.9 7.9 8.1  HGB 13.6 12.2* 12.5* 13.0 12.8*  HCT 39.7 36.1* 36.3* 38.5* 36.5*  MCV 93.9 94.0 94.5 93.4 92.6  PLT 193 161 159 240 183   Cardiac Enzymes: No results found for this basename: CKTOTAL, CKMB, CKMBINDEX, TROPONINI,  in the last 168 hours CBG:  Recent Labs Lab 04/25/13 1243 04/25/13 1658 04/25/13 2154 04/26/13 0834 04/26/13 1117  GLUCAP 127* 137*  131* 131* 122*    Iron Studies: No results found for this basename: IRON, TIBC, TRANSFERRIN, FERRITIN,  in the last 72 hours Studies/Results: No results found. Marland Kitchen allopurinol  300 mg Oral Daily  . amiodarone  400 mg Oral BID  . amLODipine  5 mg Oral Daily  . aspirin EC  81 mg Oral Daily  . cholecalciferol  1,000 Units Oral Daily  . citalopram  20 mg Oral Daily  . hydrALAZINE  50 mg Oral Q8H  . insulin aspart  0-5 Units Subcutaneous QHS  . insulin aspart  0-9 Units Subcutaneous TID WC  . metoprolol tartrate  12.5 mg Oral BID  . pantoprazole  20 mg Oral Daily  . [START ON 04/27/2013] rivaroxaban  15 mg Oral Q supper  . sodium chloride  3 mL Intravenous Q12H    BMET    Component Value Date/Time   NA 135 04/26/2013 0500   K 3.4* 04/26/2013 0500   CL 97 04/26/2013 0500   CO2 24 04/26/2013 0500   GLUCOSE 122* 04/26/2013 0500   BUN 44* 04/26/2013 0500   CREATININE 2.35* 04/26/2013 0500   CREATININE 2.60* 04/11/2013 0831   CALCIUM 9.1 04/26/2013 0500   GFRNONAA 29* 04/26/2013 0500   GFRAA 33* 04/26/2013 0500   CBC  Component Value Date/Time   WBC 8.1 04/26/2013 0500   RBC 3.94* 04/26/2013 0500   HGB 12.8* 04/26/2013 0500   HCT 36.5* 04/26/2013 0500   PLT 183 04/26/2013 0500   MCV 92.6 04/26/2013 0500   MCH 32.5 04/26/2013 0500   MCHC 35.1 04/26/2013 0500   RDW 14.4 04/26/2013 0500   LYMPHSABS 2.9 10/09/2012 1227   MONOABS 0.9 10/09/2012 1227   EOSABS 0.0 10/09/2012 1227   BASOSABS 0.0 10/09/2012 1227    Impression: 1. CAD s/p CABG s/p LHC without intervention. PDA occlussion 2. AKI/CKD in solitary kidney during acute decompensated systolic CHF 3. OSA 4. Morbid obesity 5. HTN 6. A fib with RVR s/p DCCV 04/19/13 7. Dyslipidemia 8. DM 9.   Assessment/Plan:  1. AKI/CKD- improved with improved hemodynamics and sinus rhythm but then rose today. Likely due to ongoing diuresis.  1. Have stopped lasix gtt for cath but would restart lasix at  40mg  IV BID (was on po 40mg  qd   PTA) 2. Will follow daily UOP and Scr.  3. Hopefully Scr will improve but did receive 80cc of IV contrast today.   4. Will need to follow Scr for peak in next 24-48 hrs.  2. Hyponatremia- likely due to overdiuresis given increase in BUN/Cr and net negative 3L diuresis on Monday. Improved with holding lasix.  Will restart lasix tomorrow and follow. 3. Dyspnea/CAD- not relieved with cardioversion s/p LHC without evidence of ischemia but did have prior infarct and occlusion of PDA. 4. Acute on Chronic CHF- systolic.  Will resume IV lasix tomorrow.  LVEDP markedly improved.  5. OSA- on CPAP 6. HTN- stable but could use better control. Consider BB per Dr. Rennis Golden 7. A fib with RVR s/p DCCV 10/17 now in NSR 8. Chronic systolic HF- diuresed. Will follow 9. DM- per primary svc 10. obestity 11. dyslipidemia 12.   Courtany Mcmurphy A

## 2013-04-26 NOTE — Interval H&P Note (Signed)
History and Physical Interval Note:  04/26/2013 7:55 AM  [name] [name]  has presented today for surgery, with the diagnosis of cad  The various methods of treatment have been discussed with the patient and family. After consideration of risks, benefits and other options for treatment, the patient has consented to  Procedure(s): LEFT HEART CATHETERIZATION WITH CORONARY ANGIOGRAM (N/A) as a surgical intervention .  The patient's history has been reviewed, patient examined, no change in status, stable for surgery.  I have reviewed the patient's chart and labs.  Questions were answered to the patient's satisfaction.    Cath Lab Visit (complete for each Cath Lab visit)  Clinical Evaluation Leading to the Procedure:   ACS: no  Non-ACS:    Anginal Classification: CCS II  Anti-ischemic medical therapy: Maximal Therapy (2 or more classes of medications)  Non-Invasive Test Results: Equivocal test results  Prior CABG: Previous CABG  [name] Nose, MD, [facility] Attending Cardiologist [facility]  [name] [name]

## 2013-04-26 NOTE — CV Procedure (Signed)
CARDIAC CATHETERIZATION REPORT  [name] [name]   [id number] 28-Sep-1955  Performing Cardiologist: [name] Primary Physician: Carollee Herter, MD Primary Cardiologist:  Dr. Rennis Golden  Procedures Performed:  Left Heart Catheterization via 5 Fr right femoral artery access  Native Coronary Angiography  Internal Mammary Graft Angiography  Saphenous Vein Graft Angiography  Indication(s): dyspnea and systolic heart failure  Pre-Procedural Diagnosis(es):  1. Acute on chronic systolic heart failure, newly reduced EF to 35% 2. Equivocal nuclear stress test  Post-Procedural Diagnosis(es): 1. Patent bypass grafts  Pre-Procedural Non-invasive testing: Indeterminate.  History: 57 y.o. male presented with ischemic cardiomyopathy, status post CABG with new adduction and ejection fraction as well as recurrent atrial fibrillation, who was admitted due to aggressively worsening dyspnea. He was noted in the clinic to be in A. fib with RVR. Initial plans were amiodarone bolus followed by cardioversion. Noted also noted to have acute on chronic renal insufficiency with his single kidney. Initial plans of right and left heart catheterization on Monday, 04/22/2013 were postponed due to his renal insufficiency. He underwent nuclear stress testing yesterday which did not show any evidence of ischemia but EF of estimated 37% which correlates well with his ejection fraction by echo 30-40%. There was evidence of infarction but not ongoing ischemia. A likely thought process is that he has had some graft shutdown. Despite being restored sinus rhythm, and improvement in his renal function, he continues to note dyspnea on exertion beyond his normal baseline.  Recent right heart catheterization continues to show elevated right and left heart pressures. He is now referred for LHC to evaluate his grafts. 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 Nephrology has been involved as  well and he has been aggressively hydrated. Despite the risk he acknowledged about possible progression to short or long term dialysis, he agreed to proceed.   Risks / Complications include, but not limited to: Death, MI, CVA/TIA, VF/VT (with defibrillation), Bradycardia (need for temporary pacer placement), contrast induced nephropathy, bleeding / bruising / hematoma / pseudoaneurysm, vascular or coronary injury (with possible emergent CT or Vascular Surgery), adverse medication reactions, infection.    Consent: Risks of procedure as well as the alternatives and risks of each were explained to the (patient/caregiver).  Consent for procedure obtained.  Procedure: The patient was brought to the 2nd Floor [facility] in the fasting state and prepped and draped in the usual sterile fashion for (Right groin) access. The radial arteries were not used to reduce contrast use and preserve radials for possible dialysis fistula.  Time Out: Verified patient identification, verified procedure, site/side was marked, verified correct patient position, special equipment/implants available, radiation safety measures in place (including badges and shielding), medications/allergies/relevent history reviewed, required imaging and test results available.  Performed  Procedure: The right femoral head was identified using tactile and fluoroscopic technique.  The right groin was anesthetized with 1% subcutaneous Lidocaine.  The right Common Femoral Artery was accessed using the Modified Seldinger Technique with placement of (5 Fr) sheath using the Seldinger technique.  Dr. [place] was called in to help with arterial access which was difficult given his body habitus - ultrasound and a smart needle were used.  Eventually the sheath was successfully placed and aspirated and flushed.  A 5 Fr JL4 Catheter was advanced of over a Standard J wire into the ascending Aorta.  The catheter was used to engage the  left coronary artery.  Multiple cineangiographic views of the  left coronary artery system(s) were performed. A 5 Fr JR4 Catheter was advanced of over a Safety J wire into the ascending Aorta.  The catheter was used to engage the right coronary artery and the SVG to OM branches.  Multiple cineangiographic views of the right coronary artery system(s) were performed. This catheter was exchanged for a multipurpose catheter and ultimately an LCB catheter which engaged the SVG to Diagonal. Multiple cineangiographic views of this vein graft were obtained. This catheter was then exchanged over the Standard J wire for an angled Pigtail catheter that was advanced across the Aortic Valve.  LV hemodynamics were measured and the catheter was pulled back across the Aortic Valve for measurement of "pull-back" gradient.  The catheter and the wire was removed completely out of the body. The patient was transferred to the holding area where the sheath was removed with manual pressure held for hemostasis.   Recovery: The patient was transported to the cath lab holding area in stable condition.   The patient  was stable before, during and following the procedure.   Patient did tolerate procedure well. There were not complications.  EBL: Minimal  Medications:  Premedication: none  Sedation:  3 mg IV Versed, 100 mcg IV Fentanyl  Contrast:  80 ml Omnipaque  Local Anesthesia: 5 cc 1% lidocaine  10 mg IV hydralazine  Hemodynamics:  Central Aortic Pressure / Mean Aortic Pressure: 168/85  LV Pressure / LV End diastolic Pressure:  29  Coronary Angiographic Data:  Left Main:  Angiographically normal, short vessel  Left Anterior Descending (LAD):  Patent proximally, but severe mid-vessel disease, homocollateral from a large 1st septal perforator noted  Small diagonals - fill retrograde via SVG and branch off of the LIMA  Circumflex (LCx):  Presumed dominant. Appears occluded at mid-vessel, severe tubular proximal  stenosis  1st obtuse marginal:  Seen filling retrograde via bypass graft       2nd obtuse marginal:  Filled retrograde via bypass graft  posterior lateral branch:  Not well visualized  Right Coronary Artery: Smaller, non-dominant vessel   Grafts  LIMA - LAD/Diagonal: Patent, TIMI III flow, good distal runoff  SVG - DIAGONALS: Small vein, patent, TIMI III flow, good distal runoff to 2 small diagonals  OM1 and OM2: Patent, TIMI III flow, good distal runoff  Impression: 1.  Left dominant system. Patent LIMA-LAD/small diagonal, SVG to Diagonals and SVG to OM1 -> OM2 (skip graft)  2.  Non-dominant RCA, no significant obstruction.  The PDA was not visualized and may be occluded (explaining the fixed defect on NST) 3.  LVEDP = 29 mmHg  Plan: 1.  He will need continued diuresis and afterload reduction as his renal function allows. 2.  LVEDP has reduced to 29 mmHg from 39 mmHg (PCWP) at his recent RHC. 3.  Can re-establish oral anticoagulation for a-fib starting in the am tomorrow. Hold heparin today.  4.  Await PFT results - may need pulmonary consultation for dyspnea based on findings.  The case and results was discussed with the patient and family if available.  The case and results was not discussed with the patient's PCP. The case and results was discussed with the patient's Cardiologist.  Time Spent Directly with the Patient:  120 minutes  [name], MD, [facility] Attending Cardiologist [facility]  Ameen Mostafa C 04/26/2013, 11:02 AM

## 2013-04-26 NOTE — H&P (View-Only) (Signed)
  ADMISSION HISTORY & PHYSICAL   Chief Complaint:  Dyspnea, fatigue, cough  Cardiologist: Paulanthony Gleaves  Primary Care Physician: LALONDE,JOHN CHARLES, MD  HPI:  This is a 57 y.o. male with a past medical history significant for coronary artery disease and bypass surgery x 3 vessels in 2005 (LIMA to LAD, SVG to DIAG, and SVG to OM1/OM2). He also has hyperlipidemia and hypertriglyceridemia with triglycerides of over 3000 in the past, morbid obesity, OSA on CPAP and recently atrial fibrillation. Most recent labs showed triglycerides of 306, HDL of 36, VLDL 61 and LDL of 89. He had an outpatient cardiac catheterization on Nov 18, 2010 that showed the grafts were widely patent. Because of a solitary kidney he did not have an LVgram. His last creatinine October 11, 2012 was 1.58. The BUN was 31. The patient was seen on October 10, 2012 and was found to be in atrial fibrillation. He was scheduled for repeat cardioversion and had been placed on Xarelto at that time. Dr. Norvell Caswell did not feel it was safe without general anesthesia to do the TEE due to his history of obstructive sleep apnea and relatively thick neck. The patient also had interrupted his Xarelto for 1 day due to gingival bleeding but he had restarted it on Monday, April 14, and has been taking it ever since without any recurrent bleeding. He did undergo successful cardioversion to sinus rhythm and reported that he felt better. Subsequently he had an echocardiogram which showed a newly reduced ejection fraction of 35%. This is in the setting of worsening shortness of breath. I recently started him on low-dose Lasix however his creatinine is rising and is now over 3. He has never seen a nephrologist. As above, he is known to have a solitary kidney. Today he returns and reports markedly increased shortness of breath without much improvement with diuresis. It is noted that he is in atrial fibrillation with rapid ventricular response. I suspect either this or  coronary bypass graft dysfunction may be contributing to his heart failure.   PMHx:  Past Medical History  Diagnosis Date  . Hypertension   . Obesity     with a BMI of 35  . Gout   . GERD (gastroesophageal reflux disease)   . Depression   . ASHD (arteriosclerotic heart disease)   . Diabetes mellitus   . Hyperlipidemia   . Hypertriglyceridemia     TRIGS OVER 3000 IN THE PAST; MOST RECENT TRIGS OF 306, HDL 36, VLDL 61, LDL 89  . Arrhythmia     A-FIB; ON XARELTO  . 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 History of tobacco abuse     PT QUIT 07/04/12  . OSA on CPAP   . Cardiomegaly     PER CXR RESULTS ON 11/16/10  . CHF (congestive heart failure)   . Shortness of breath     Past Surgical History  Procedure Laterality Date  . Appendectomy  1983  . Cardioversion N/A 11/14/2012    Procedure: CARDIOVERSION;  Surgeon: Maisen Schmit C. Basir Niven, MD;  Location: [facility];  Service: Cardiovascular;  Laterality: N/A;  . Coronary artery bypass graft  05/11/04    X 4; HAS A VERY SHORT AORTA, ROUND DILATED HEART AS WELL AS A STERNAL ABNORMALITY   . Cardiac catheterization  11/18/10    GRAFTS WIDELY PATENT WITH NO HIGH-GRADE CAD  . Carotids  04/03/12    CAROTID DUPLEX; RIGHT BULB/PROXIMAL ICA: MILD AMT OF FIBROUS PLAQUE   SLIGHTLY ELEVATING VELOCITIES WITHIN THE PROXIMAL SEGMENT OF THE INTERNAL CAROTID  ARTERY  . Nm myoview ltd  09/17/09    EF 49%; GLOBAL LV SYSTOLIC FUNCTION MILDLY REDUCED    FAMHx:  Family History  Problem Relation Age of Onset  . Heart disease Father   . Cancer Father 69    LUNG  . Hypertension Father   . Diabetes Brother   . Stroke Maternal Grandmother 60  . Heart disease Paternal Grandmother     SOCHx:   reports that he quit smoking about 9 months ago. His smoking use included Cigarettes. He smoked 1.00 pack per day. He has never used smokeless tobacco. He reports that he does not drink alcohol or use illicit drugs.  ALLERGIES:  No Known  Allergies  ROS: A comprehensive review of systems was negative except for: Constitutional: positive for fatigue and weight gain Respiratory: positive for cough and dyspnea on exertion Cardiovascular: positive for dyspnea and irregular heart beat  HOME MEDS: Medications Prior to Admission  Medication Sig Dispense Refill  . acetaminophen (TYLENOL) 500 MG tablet Take 1,000 mg by mouth every 6 (six) hours as needed for pain.      . allopurinol (ZYLOPRIM) 300 MG tablet take 1 tablet by mouth once daily  30 tablet  12  . amLODipine (NORVASC) 5 MG tablet take 1 tablet by mouth once daily  30 tablet  11  . aspirin EC 81 MG tablet Take 81 mg by mouth at bedtime.      . atenolol (TENORMIN) 50 MG tablet take 1 tablet by mouth once daily  30 tablet  11  . busPIRone (BUSPAR) 15 MG tablet take 1 tablet by mouth twice a day AT 10AM AND 5PM.  60 tablet  5  . cholecalciferol (VITAMIN D) 1000 UNITS tablet Take 1,000 Units by mouth daily.      . Choline Fenofibrate (TRILIPIX) 135 MG capsule Take 135 mg by mouth daily.      . citalopram (CELEXA) 20 MG tablet take 1 tablet by mouth once daily  30 tablet  11  . FORTESTA 10 MG/ACT (2%) GEL apply 4 PUMPS once daily to SKIN  60 g  5  . furosemide (LASIX) 40 MG tablet Take 1 tablet (40 mg total) by mouth daily.  30 tablet  11  . Lansoprazole (PREVACID PO) Take 1 capsule by mouth at bedtime.      . levalbuterol (XOPENEX HFA) 45 MCG/ACT inhaler Inhale 1-2 puffs into the lungs every 4 (four) hours as needed for wheezing.  1 Inhaler  12  . metFORMIN (GLUMETZA) 500 MG (MOD) 24 hr tablet Take 1 tablet (500 mg total) by mouth daily with breakfast.  60 tablet  5  . omega-3 acid ethyl esters (LOVAZA) 1 G capsule Take 2 g by mouth 2 (two) times daily.      . Rivaroxaban (XARELTO) 15 MG TABS tablet Take 15 mg by mouth daily.      . SIMCOR 1000-40 MG TB24 take 1 tablet by mouth once daily  30 tablet  11  . valsartan-hydrochlorothiazide (DIOVAN-HCT) 320-12.5 MG per tablet take  1 tablet by mouth once daily  30 tablet  11  . glucose blood test strip Use as instructed(THIS IS FOR THE ONETOUCH VERIO IQ )  100 each  PRN  . ONETOUCH DELICA LANCETS FINE MISC 1 each by Does not apply route 2 (two) times daily.  100 each  PRN    LABS/IMAGING: Results for orders placed during the   hospital encounter of 04/18/13 (from the past 48 hour(s))  GLUCOSE, CAPILLARY     Status: Abnormal   Collection Time    04/18/13 12:12 PM      Result Value Range   Glucose-Capillary 212 (*) 70 - 99 mg/dL  CBC     Status: None   Collection Time    04/18/13 12:58 PM      Result Value Range   WBC 8.8  4.0 - 10.5 K/uL   RBC 4.25  4.22 - 5.81 MIL/uL   Hemoglobin 14.1  13.0 - 17.0 g/dL   HCT 39.7  39.0 - 52.0 %   MCV 93.4  78.0 - 100.0 fL   MCH 33.2  26.0 - 34.0 pg   MCHC 35.5  30.0 - 36.0 g/dL   RDW 14.2  11.5 - 15.5 %   Platelets 182  150 - 400 K/uL  BASIC METABOLIC PANEL     Status: Abnormal   Collection Time    04/18/13 12:58 PM      Result Value Range   Sodium 137  135 - 145 mEq/L   Potassium 3.9  3.5 - 5.1 mEq/L   Chloride 99  96 - 112 mEq/L   CO2 24  19 - 32 mEq/L   Glucose, Bld 193 (*) 70 - 99 mg/dL   BUN 68 (*) 6 - 23 mg/dL   Creatinine, Ser 2.60 (*) 0.50 - 1.35 mg/dL   Calcium 9.3  8.4 - 10.5 mg/dL   GFR calc non Af Amer 26 (*) >90 mL/min   GFR calc Af Amer 30 (*) >90 mL/min   Comment: (NOTE)     The eGFR has been calculated using the CKD EPI equation.     This calculation has not been validated in all clinical situations.     eGFR's persistently <90 mL/min signify possible Chronic Kidney     Disease.  APTT     Status: Abnormal   Collection Time    04/18/13 12:58 PM      Result Value Range   aPTT 52 (*) 24 - 37 seconds   Comment:            IF BASELINE aPTT IS ELEVATED,     SUGGEST PATIENT RISK ASSESSMENT     BE USED TO DETERMINE APPROPRIATE     ANTICOAGULANT THERAPY.   No results found.  VITALS: Filed Vitals:   04/18/13 1228  BP: 122/79  Pulse: 106  Temp:  97 F (36.1 C)  Resp: 20    EXAM: General appearance: Mildly dyspneic, cannot finish sentences, oriented HEENT: no adenopathy, no carotid bruit, no JVD, supple, symmetrical, trachea midline and thyroid not enlarged, symmetric, no tenderness/mass/nodules PULM: diminished breath sounds bilaterally. No rales CV: irregularly irregular, tachycardic GI: soft, non-tender; bowel sounds normal; no masses,  no organomegaly and obese Musculoskeletal: edema trace bilaterally Pulses: 2+ and symmetric Integument: Pale, warm,d ry Neurologic: Grossly normal Psych: Appears mildly anxious  IMPRESSION: 1. Principal Problem: 2.   Atrial fibrillation with RVR 3. Active Problems: 4.   ASHD (arteriosclerotic heart disease) 5.   Hypertension associated with diabetes 6.   Type II or unspecified type diabetes mellitus without mention of complication, not stated as uncontrolled 7.   Obesity: BMI 36.8 8.   Chronic anticoagulation 9.  Acute decompensated systolic congestive heart failure, LVEF 35%, NYHA Class IV symptoms.  PLAN: 1. Admit for rate control with diltiazem. 2. Stop amlodipine. 3. Load with IV Amiodarone for rate control., possible rhythm conversion vs.   DCCV tomorrow. NPO p MN. 4.   Hold xarelto - start IV heparin. 5.   Renal consult today - solitary kidney, ?diuresis options - does not appear markedly volume overloaded. 6.   Suspect graft failure - he will likely need repeat L/RHC once he is back in sinus.  Trevian Hayashida C. Sianna Garofano, MD, FACC Attending Cardiologist [facility]  Loys Shugars C 04/18/2013, 1:57 PM  

## 2013-04-27 DIAGNOSIS — E785 Hyperlipidemia, unspecified: Secondary | ICD-10-CM

## 2013-04-27 LAB — RENAL FUNCTION PANEL
CO2: 23 mEq/L (ref 19–32)
Calcium: 8.9 mg/dL (ref 8.4–10.5)
Chloride: 101 mEq/L (ref 96–112)
GFR calc Af Amer: 37 mL/min — ABNORMAL LOW (ref >90)
GFR calc non Af Amer: 32 mL/min — ABNORMAL LOW (ref >90)
Glucose, Bld: 111 mg/dL — ABNORMAL HIGH (ref 70–99)
Phosphorus: 3.7 mg/dL (ref 2.3–4.6)
Potassium: 4.4 mEq/L (ref 3.5–5.1)
Sodium: 135 mEq/L (ref 135–145)

## 2013-04-27 LAB — CBC
Hemoglobin: 12.7 g/dL — ABNORMAL LOW (ref 13.0–17.0)
MCH: 32.2 pg (ref 26.0–34.0)
MCHC: 34.1 g/dL (ref 30.0–36.0)
Platelets: 179 10*3/uL (ref 150–400)
RBC: 3.94 MIL/uL — ABNORMAL LOW (ref 4.22–5.81)
RDW: 14.3 % (ref 11.5–15.5)
WBC: 7.6 10*3/uL (ref 4.0–10.5)

## 2013-04-27 LAB — GLUCOSE, CAPILLARY
Glucose-Capillary: 95 mg/dL (ref 70–99)
Glucose-Capillary: 104 mg/dL — ABNORMAL HIGH (ref 70–99)
Glucose-Capillary: 88 mg/dL (ref 70–99)

## 2013-04-27 MED ORDER — METOPROLOL TARTRATE 25 MG PO TABS: 12.5000 mg | ORAL_TABLET | Freq: Two times a day (BID) | ORAL | Status: DC

## 2013-04-27 MED ORDER — HYDRALAZINE HCL 50 MG PO TABS: 50.0000 mg | ORAL_TABLET | Freq: Three times a day (TID) | ORAL | Status: DC

## 2013-04-27 NOTE — Progress Notes (Signed)
Patient ID: [name] [name], male   DOB: Nov 19, 1955, 57 y.o.   MRN: [id number] S:feels good and hopes to go home today O:BP 137/58  Pulse 77  Temp(Src) 98 F (36.7 C) (Oral)  Resp 17  Ht 5\' 8"  (1.727 m)  Wt 109.7 kg (241 lb 13.5 oz)  BMI 36.78 kg/m2  SpO2 98%  Intake/Output Summary (Last 24 hours) at 04/27/13 0925 Last data filed at 04/27/13 0900  Gross per 24 hour  Intake   1080 ml  Output   1050 ml  Net     30 ml   Intake/Output: I/O last 3 completed shifts: In: 1982.4 [P.O.:320; I.V.:1662.4] Out: 2175 [Urine:2175]  Intake/Output this shift:  Total I/O In: 261.5 [P.O.:240; I.V.:21.5] Out: -  Weight change:  Gen:WD obese WM in NAD CVS:no rub Resp:cta WUJ:[id number] Ext:tr edema   Recent Labs Lab 04/21/13 0535 04/22/13 0835 04/24/13 0947 04/25/13 0550 04/26/13 0500 04/27/13 0522  NA 138 135 135 133* 135 135  K 4.1 4.0 3.7 4.1 3.4* 4.4  CL 99 99 94* 93* 97 101  CO2 23 23 24 22 24 23   GLUCOSE 132* 134* 209* 126* 122* 111*  BUN 71* 54* 34* 41* 44* 36*  CREATININE 2.95* 2.32* 2.06* 2.55* 2.35* 2.18*  ALBUMIN 3.3* 3.3* 3.3* 3.0* 2.9* 2.8*  CALCIUM 8.9 9.1 9.5 9.5 9.1 8.9  PHOS 4.6 2.4 3.4 4.3 4.2 3.7   Liver Function Tests:  Recent Labs Lab 04/25/13 0550 04/26/13 0500 04/27/13 0522  ALBUMIN 3.0* 2.9* 2.8*   No results found for this basename: LIPASE, AMYLASE,  in the last 168 hours No results found for this basename: AMMONIA,  in the last 168 hours CBC:  Recent Labs Lab 04/23/13 0540 04/24/13 0132 04/25/13 0550 04/26/13 0500 04/27/13 0522  WBC 9.1 7.9 7.9 8.1 7.6  HGB 12.2* 12.5* 13.0 12.8* 12.7*  HCT 36.1* 36.3* 38.5* 36.5* 37.2*  MCV 94.0 94.5 93.4 92.6 94.4  PLT 161 159 240 183 179   Cardiac Enzymes: No results found for this basename: CKTOTAL, CKMB, CKMBINDEX, TROPONINI,  in the last 168 hours CBG:  Recent Labs Lab 04/26/13 1117 04/26/13 1644 04/26/13 2124 04/27/13 0517 04/27/13 0813  GLUCAP 122* 156* 95 104* 135*    Iron Studies:  No results found for this basename: IRON, TIBC, TRANSFERRIN, FERRITIN,  in the last 72 hours Studies/Results: No results found. Marland Kitchen allopurinol  300 mg Oral Daily  . amLODipine  5 mg Oral Daily  . aspirin EC  81 mg Oral Daily  . cholecalciferol  1,000 Units Oral Daily  . citalopram  20 mg Oral Daily  . furosemide  40 mg Intravenous BID  . hydrALAZINE  50 mg Oral Q8H  . insulin aspart  0-5 Units Subcutaneous QHS  . insulin aspart  0-9 Units Subcutaneous TID WC  . metoprolol tartrate  12.5 mg Oral BID  . pantoprazole  20 mg Oral Daily  . rivaroxaban  15 mg Oral Q supper  . sodium chloride  3 mL Intravenous Q12H    BMET    Component Value Date/Time   NA 135 04/27/2013 0522   K 4.4 04/27/2013 0522   CL 101 04/27/2013 0522   CO2 23 04/27/2013 0522   GLUCOSE 111* 04/27/2013 0522   BUN 36* 04/27/2013 0522   CREATININE 2.18* 04/27/2013 0522   CREATININE 2.60* 04/11/2013 0831   CALCIUM 8.9 04/27/2013 0522   GFRNONAA 32* 04/27/2013 0522   GFRAA 37* 04/27/2013 0522   CBC    Component  Value Date/Time   WBC 7.6 04/27/2013 0522   RBC 3.94* 04/27/2013 0522   HGB 12.7* 04/27/2013 0522   HCT 37.2* 04/27/2013 0522   PLT 179 04/27/2013 0522   MCV 94.4 04/27/2013 0522   MCH 32.2 04/27/2013 0522   MCHC 34.1 04/27/2013 0522   RDW 14.3 04/27/2013 0522   LYMPHSABS 2.9 10/09/2012 1227   MONOABS 0.9 10/09/2012 1227   EOSABS 0.0 10/09/2012 1227   BASOSABS 0.0 10/09/2012 1227     Impression:  1. CAD s/p CABG s/p LHC without intervention. PDA occlussion 2. AKI/CKD in solitary kidney during acute decompensated systolic CHF 3. OSA 4. Morbid obesity 5. HTN 6. A fib with RVR s/p DCCV 04/19/13 7. Dyslipidemia 8. DM Ass Assessment/Plan:  1. AKI/CKD- improved with improved hemodynamics and sinus rhythm but then rose today.  1. restarted lasix at 40mg  IV BID this morning.  Will see if he responds well to this dose (was on po 40mg  qd PTA) 2. Will follow daily UOP and Scr.  3. Scr did improve but he  received 80cc of IV contrast.  4. Will need to follow Scr for peak in next 24-48 hrs.  2. Hyponatremia- likely due to overdiuresis given increase in BUN/Cr and net negative 3L diuresis on Monday. Improved with holding lasix.  1. restart lasix today and follow. 3. Dyspnea/CAD- not relieved with cardioversion s/p LHC without evidence of ischemia but did have prior infarct and occlusion of PDA. 4. Acute on Chronic CHF- systolic. Restarted IV lasix and will need to transition to po per cards. LVEDP markedly improved.  5. OSA- on CPAP 6. HTN- stable but could use better control. Consider BB per Dr. Rennis Golden 7. A fib with RVR s/p DCCV 10/17 now in NSR 8. Chronic systolic HF- diuresed. Will follow 9. DM- per primary svc 10. obestity 11. dyslipidemia 12. Disposition- will need to follow up with our practice as an outpt (not seen for 40yrs).  He would like to see Dr. Marisue Humble in our office for follow up.  Will arrange this on Monday and will notify pt if he has been discharged.  I gave him our card with contact info. 1. D/c plans per cards.  Pearlena Ow A

## 2013-04-27 NOTE — Discharge Summary (Signed)
Physician Discharge Summary  Patient ID: ANNA LIVERS MRN: [id number] DOB/AGE: [birth year] 57 y.o.  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 Patent grafts 5/12   Cardiomyopathy, ischemic- EF 30-40% 2D 03/25/13   Acute on chronic renal insufficiency   Chronic renal insufficiency, stage III (moderate)   Discharged Condition: stable  Hospital Course: the patient is a 57 y/o male, followed by Dr. Rennis Golden, with ischemic cardiomyopathy, status post CABG with new reduction in ejection fraction (30-40%) as well as recurrent atrial fibrillation, who was admitted due to aggressively worsening dyspnea. He was initially seen by Dr. Rennis Golden in clinic on 10/16 for this and was found to be in atrial fibrillation w/ RVR. He was admitted to [facility]. The initial plan was for amiodarone bolus followed by cardioversion. It was also planned to do a right and left heart cath, to evaluate volume status and coronaries,  to seek potential causes of reduced systolic function. However, this was put on hold, due to acute on chronic renal insufficiency, in the setting of a solitary kidney. Nephrology was consulted and initially recommend against catherization, due to further renal risk with dye exposure. His atrial fibrillation was successfully treated with DDVC. He had been on Xarelto prior to the procedure. He remained in NSR. He  later eventually underwent a RHC which demonstrated severely elevated LVEDP of 39 mmHg. This was thought to be secondary to combination of systolic and diastolic HF. Afterload reduction and diuresis was recommended. He was started on IV Lasix and hydralazine. He also underwent a NST which demonstrated depressed calculated left ventricular ejection fraction of 37% with near akinesis of the septum and suggestion of dyskinetic apical wall motion. There was evidence of probable scar involving the apex, distal anterior wall and also potentially the basilar inferolateral wall. There was no evidence of inducible myocardial ischemia. However, it was felt that his newly reduced EF was likely due to graft dysfunction. The risks and benefits were weighed and it was decided to proceed with a LHC. Nephrology was consulted to assist with pre-cath management to prevent further renal damage. His Lasix was held 24 hrs pre-cath and he had gentle hydration with IVFs. The cath showed patent grafts and no obstructive disease. His LVEDP had improved from 39 to 29 mm Hg. Continued diuresis and afterload reduction was recommended. He left the cath lab in stable condition. He returned to stepdown for gentle hydration and observation. On post cath day # 1. His SCr was 2.18. This was improved from 2.55 and 2.35. Nephrology restarted IV Lasix. The plan was to keep for an additional 24-48 hrs for further monitoring of renal function. However, the patient was adamant about being discharged. A discussion was held by Dr. Tresa Endo, encouraging the patient to remain in the hospital for further observation for another day, however he refused. Dr. Tresa Endo agreed to discharge. He ordered the patient to have labs  obtained on Monday 10/27. The patient was provided a prescription for a BMP to assess SCr. He was instructed not to resume PO Lasix until after follow up by Dr. Rennis Golden and Nephrology. He will follow up with Dr. Rennis Golden after labs are obtained.     Consults: nephrology  Significant Diagnostic Studies:   RHC  Impression:  Severely elevated LVEDP, likely secondary to combination of systolic and diastolic heart failure.  Secondary Pulmonary Hypertension, Moderate   NST IMPRESSION: Depressed calculated left ventricular ejection fraction of 37% with near akinesis of the septum and suggestion of dyskinetic apical wall motion. There is evidence of probable scar involving the apex, distal anterior wall and also potentially the basilar inferolateral wall. There is no evidence of inducible myocardial ischemia.   LHC Impression:  1. Left dominant system. Patent LIMA-LAD/small diagonal, SVG to Diagonals and SVG to OM1 -> OM2 (skip graft)  2. Non-dominant RCA, no significant obstruction. The PDA was not visualized and may be occluded (explaining the fixed defect on NST)  3. LVEDP = 29 mmHg    Treatments: See Hospital Course  Discharge Exam: Blood pressure 123/54, pulse 77, temperature 98 F (36.7 C), temperature source Oral, resp. rate 23, height 5\' 8"  (1.727 m), weight 241 lb 13.5 oz (109.7 kg), SpO2 98.00%.   Disposition: 01-Home or Self Care  Discharge Orders   Future Orders Complete By Expires   Diet - low sodium heart healthy  As directed    Discharge instructions  As directed    Comments:     Wait until Monday to resume Metformin Get Labs checked on Monday F/U with Renal MD   Driving Restrictions  As directed    Comments:     No driving for 3 days   Increase activity slowly  As directed    Lifting restrictions  As directed    Comments:     No lifting more than 1/2 gallon of milk x 3 days       Medication List    STOP taking these medications       atenolol 50 MG tablet  Commonly known as:  TENORMIN     furosemide 40 MG tablet  Commonly known as:  LASIX     valsartan-hydrochlorothiazide 320-12.5 MG per tablet  Commonly known as:  DIOVAN-HCT      TAKE these medications       acetaminophen 500  MG tablet  Commonly known as:  TYLENOL  Take 1,000 mg by mouth every 6 (six) hours as needed for pain.     allopurinol 300 MG tablet  Commonly known as:  ZYLOPRIM  take 1 tablet by mouth once daily     amLODipine 5 MG tablet  Commonly known as:  NORVASC  take 1 tablet by mouth once daily     aspirin EC 81 MG tablet  Take 81 mg by mouth at bedtime.     busPIRone 15 MG tablet  Commonly known as:  BUSPAR  take 1 tablet by mouth twice a day AT 10AM AND 5PM.     cholecalciferol 1000 UNITS tablet  Commonly known as:  VITAMIN D  Take 1,000 Units by mouth daily.     citalopram 20 MG tablet  Commonly known as:  CELEXA  take 1 tablet by mouth once daily     FORTESTA 10 MG/ACT (2%) Gel  Generic drug:  Testosterone  apply 4 PUMPS once daily to SKIN     glucose blood test strip  Use as instructed(THIS  IS FOR THE ONETOUCH VERIO IQ )     hydrALAZINE 50 MG tablet  Commonly known as:  APRESOLINE  Take 1 tablet (50 mg total) by mouth every 8 (eight) hours.     levalbuterol 45 MCG/ACT inhaler  Commonly known as:  XOPENEX HFA  Inhale 1-2 puffs into the lungs every 4 (four) hours as needed for wheezing.     metFORMIN 500 MG (MOD) 24 hr tablet  Commonly known as:  GLUMETZA  Take 1 tablet (500 mg total) by mouth daily with breakfast.     metoprolol tartrate 25 MG tablet  Commonly known as:  LOPRESSOR  Take 0.5 tablets (12.5 mg total) by mouth 2 (two) times daily.     omega-3 acid ethyl esters 1 G capsule  Commonly known as:  LOVAZA  Take 2 g by mouth 2 (two) times daily.     ONETOUCH DELICA LANCETS FINE Misc  1 each by Does not apply route 2 (two) times daily.     PREVACID PO  Take 1 capsule by mouth at bedtime.     SIMCOR 1000-40 MG Tb24  Generic drug:  Niacin-Simvastatin  take 1 tablet by mouth once daily     TRILIPIX 135 MG capsule  Generic drug:  Choline Fenofibrate  Take 135 mg by mouth daily.     XARELTO 15 MG Tabs tablet  Generic drug:  Rivaroxaban  Take 15 mg by  mouth daily.       TIME SPENT ON DISCHARGE, INCLUDING PHYSICIAN TIME: >30 MINUTES  Signed: Allayne Butcher, PA-C 04/27/2013, 4:22 PM

## 2013-04-27 NOTE — Progress Notes (Signed)
D/c instructions discussed w/Pt and partner . Understanding verbalized . Pt refused further handouts and videos today .

## 2013-04-27 NOTE — Progress Notes (Signed)
Subjective: No complaints.   Objective: Vital signs in last 24 hours: Temp:  [97.7 F (36.5 C)-98.2 F (36.8 C)] 98 F (36.7 C) (10/25 1150) Pulse Rate:  [74-77] 77 (10/25 0809) Resp:  [15-27] 17 (10/25 0809) BP: (115-171)/(51-78) 124/65 mmHg (10/25 1150) SpO2:  [94 %-98 %] 98 % (10/25 1150) Last BM Date: 04/26/13  Intake/Output from previous day: 10/24 0701 - 10/25 0700 In: 1069 [I.V.:1069] Out: 1050 [Urine:1050] Intake/Output this shift: Total I/O In: 261.5 [P.O.:240; I.V.:21.5] Out: -   Medications Current Facility-Administered Medications  Medication Dose Route Frequency Provider Last Rate Last Dose  . 0.9 %  sodium chloride infusion  250 mL Intravenous PRN [name], MD 20 mL/hr at 04/27/13 0600 250 mL at 04/27/13 0600  . acetaminophen (TYLENOL) tablet 650 mg  650 mg Oral Q4H PRN Abelino Derrick, PA-C   650 mg at 04/26/13 1310  . allopurinol (ZYLOPRIM) tablet 300 mg  300 mg Oral Daily Brittainy Simmons, PA-C   300 mg at 04/27/13 1044  . amLODipine (NORVASC) tablet 5 mg  5 mg Oral Daily Abelino Derrick, PA-C   5 mg at 04/27/13 1045  . aspirin EC tablet 81 mg  81 mg Oral Daily Judie Bonus Hammons, RPH   81 mg at 04/27/13 1045  . busPIRone (BUSPAR) tablet 15 mg  15 mg Oral BID PRN [name], MD   15 mg at 04/26/13 2135  . cholecalciferol (VITAMIN D) tablet 1,000 Units  1,000 Units Oral Daily Judie Bonus Hammons, RPH   1,000 Units at 04/27/13 1044  . citalopram (CELEXA) tablet 20 mg  20 mg Oral Daily Brittainy Simmons, PA-C   20 mg at 04/27/13 1045  . furosemide (LASIX) injection 40 mg  40 mg Intravenous BID Irena Cords, MD   40 mg at 04/27/13 0848  . hydrALAZINE (APRESOLINE) tablet 50 mg  50 mg Oral Q8H [name], MD   50 mg at 04/27/13 0547  . hydrocortisone cream 1 % 1 application  1 application Topical TID PRN Brittainy Simmons, PA-C      . insulin aspart (novoLOG) injection 0-5 Units  0-5 Units Subcutaneous QHS Luke K Kilroy, PA-C       . insulin aspart (novoLOG) injection 0-9 Units  0-9 Units Subcutaneous TID WC Abelino Derrick, PA-C   2 Units at 04/26/13 1707  . levalbuterol (XOPENEX HFA) inhaler 1-2 puff  1-2 puff Inhalation Q4H PRN Brittainy Simmons, PA-C      . metoprolol tartrate (LOPRESSOR) tablet 12.5 mg  12.5 mg Oral BID Eda Paschal Kilroy, PA-C   12.5 mg at 04/27/13 1045  . morphine 2 MG/ML injection 2 mg  2 mg Intravenous Q1H PRN [name], MD      . nitroGLYCERIN 0.2 mg/mL in dextrose 5 % infusion  2-200 mcg/min Intravenous Titrated Marykay Lex, MD 1.5 mL/hr at 04/24/13 1800 5 mcg/min at 04/24/13 1800  . ondansetron (ZOFRAN) injection 4 mg  4 mg Intravenous Q6H PRN [name], MD      . pantoprazole (PROTONIX) EC tablet 20 mg  20 mg Oral Daily Brittainy Simmons, PA-C   20 mg at 04/27/13 1045  . Rivaroxaban (XARELTO) tablet 15 mg  15 mg Oral Q supper [name], MD      . sodium chloride 0.9 % injection 3 mL  3 mL Intravenous Q12H [name], MD      . sodium chloride 0.9 % injection 3 mL  3 mL Intravenous PRN [name], MD      . zolpidem [facility]) tablet 5 mg  5 mg Oral QHS PRN [name], MD   5 mg at 04/26/13 2314    PE: General appearance: alert, cooperative and no distress Lungs: clear to auscultation bilaterally Heart: regular rate and rhythm Extremities: trace bilateral LEE Pulses: 2+ and symmetric Skin: warm and dry Neurologic: Grossly normal  Lab Results:   Recent Labs  04/25/13 0550 04/26/13 0500 04/27/13 0522  WBC 7.9 8.1 7.6  HGB 13.0 12.8* 12.7*  HCT 38.5* 36.5* 37.2*  PLT 240 183 179   BMET  Recent Labs  04/25/13 0550 04/26/13 0500 04/27/13 0522  NA 133* 135 135  K 4.1 3.4* 4.4  CL 93* 97 101  CO2 22 24 23   GLUCOSE 126* 122* 111*  BUN 41* 44* 36*  CREATININE 2.55* 2.35* 2.18*  CALCIUM 9.5 9.1 8.9   PT/INR  Recent Labs  04/26/13 0500  LABPROT 13.5  INR 1.05     Assessment/Plan  Principal Problem:   Atrial fibrillation with  RVR- DCCV 04/19/13 to NSR Active Problems:   Hypertension associated with diabetes   Hyperlipidemia LDL goal <70   Sleep apnea- on C-pap   Depression   Atrophy of left kidney   Mosaicism, 45, 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 Patent grafts 5/12   Cardiomyopathy, ischemic- EF 30-40% 2D 03/25/13   Acute on chronic renal insufficiency   Chronic renal insufficiency, stage III (moderate)  Plan: s/p LHC yesterday revealing patent LIMA-LAD small diagonal, SVG to Diagonals and SVG to OM1 -> OM2 (skip graft).   Non-dominant RCA, no significant obstruction. The PDA was not visualized and may be occluded. LVEDP had improved from 39 mmHg several days ago, down to 29 mmHg.  Continue diuresis as renal function allows. SCr is improved at 2.18 (down from 2.55 and 2.35). Nephrology's recommendations are to restart lasix 40 mg IV BID and see how he responds. MD to follow.      LOS: 9 days    Brittainy M. Delmer Islam 04/27/2013 12:23 PM   Patient seen and examined. Agree with assessment and plan. Pt is insistent om going home today.  IV lasix was resumed. Ideally it would be beneficial to keep at least until tomorrow to reassess diuresis affect and Cr 48 hrs post contrast load with baseline renal insufficiency; however, he is adamant for home today.  Will therefore dc later today. He will need lab on Monday am as outpatient, arrange for renal f/u as well as f/u with Dr. Rennis Golden this week in office.   Lennette Bihari, MD, [facility] 04/27/2013 2:32 PM

## 2013-04-29 ENCOUNTER — Other Ambulatory Visit: Payer: Self-pay | Admitting: Cardiovascular Disease

## 2013-04-29 ENCOUNTER — Telehealth: Payer: Self-pay | Admitting: Internal Medicine

## 2013-04-29 NOTE — Telephone Encounter (Signed)
[name] [name] there earlier  No diagnosis code and unable to read ordering persons signature  Order was dated 04/27/13

## 2013-04-29 NOTE — Telephone Encounter (Signed)
Informed [name] [name] per hospital discharge that a BMP was to be drawn today. Told her to use code V58.69. She could put Dr. Tresa Endo down as the ordering MD.

## 2013-04-30 LAB — BASIC METABOLIC PANEL
BUN: 38 mg/dL — ABNORMAL HIGH (ref 6–23)
CO2: 24 mEq/L (ref 19–32)
Calcium: 9.4 mg/dL (ref 8.4–10.5)
Chloride: 103 mEq/L (ref 96–112)
Creat: 1.77 mg/dL — ABNORMAL HIGH (ref 0.50–1.35)

## 2013-05-01 ENCOUNTER — Ambulatory Visit (INDEPENDENT_AMBULATORY_CARE_PROVIDER_SITE_OTHER): Payer: Managed Care, Other (non HMO) | Admitting: Cardiology

## 2013-05-01 ENCOUNTER — Encounter: Payer: Self-pay | Admitting: Cardiology

## 2013-05-01 VITALS — BP 150/60 | HR 76 | Ht 68.0 in | Wt 244.6 lb

## 2013-05-01 DIAGNOSIS — I10 Essential (primary) hypertension: Secondary | ICD-10-CM

## 2013-05-01 DIAGNOSIS — Z8249 Family history of ischemic heart disease and other diseases of the circulatory system: Secondary | ICD-10-CM

## 2013-05-01 DIAGNOSIS — I2510 Atherosclerotic heart disease of native coronary artery without angina pectoris: Secondary | ICD-10-CM

## 2013-05-01 DIAGNOSIS — N289 Disorder of kidney and ureter, unspecified: Secondary | ICD-10-CM

## 2013-05-01 DIAGNOSIS — I5021 Acute systolic (congestive) heart failure: Secondary | ICD-10-CM

## 2013-05-01 DIAGNOSIS — R0602 Shortness of breath: Secondary | ICD-10-CM

## 2013-05-01 DIAGNOSIS — I152 Hypertension secondary to endocrine disorders: Secondary | ICD-10-CM

## 2013-05-01 DIAGNOSIS — I779 Disorder of arteries and arterioles, unspecified: Secondary | ICD-10-CM

## 2013-05-01 DIAGNOSIS — N183 Chronic kidney disease, stage 3 unspecified: Secondary | ICD-10-CM

## 2013-05-01 DIAGNOSIS — I480 Paroxysmal atrial fibrillation: Secondary | ICD-10-CM

## 2013-05-01 DIAGNOSIS — Z951 Presence of aortocoronary bypass graft: Secondary | ICD-10-CM

## 2013-05-01 DIAGNOSIS — I428 Other cardiomyopathies: Secondary | ICD-10-CM

## 2013-05-01 DIAGNOSIS — E1159 Type 2 diabetes mellitus with other circulatory complications: Secondary | ICD-10-CM

## 2013-05-01 DIAGNOSIS — N269 Renal sclerosis, unspecified: Secondary | ICD-10-CM

## 2013-05-01 DIAGNOSIS — E119 Type 2 diabetes mellitus without complications: Secondary | ICD-10-CM

## 2013-05-01 DIAGNOSIS — E1169 Type 2 diabetes mellitus with other specified complication: Secondary | ICD-10-CM

## 2013-05-01 DIAGNOSIS — I4891 Unspecified atrial fibrillation: Secondary | ICD-10-CM

## 2013-05-01 DIAGNOSIS — Z7901 Long term (current) use of anticoagulants: Secondary | ICD-10-CM

## 2013-05-01 DIAGNOSIS — N189 Chronic kidney disease, unspecified: Secondary | ICD-10-CM

## 2013-05-01 DIAGNOSIS — N261 Atrophy of kidney (terminal): Secondary | ICD-10-CM

## 2013-05-01 DIAGNOSIS — I509 Heart failure, unspecified: Secondary | ICD-10-CM

## 2013-05-01 HISTORY — DX: Other cardiomyopathies: I42.8

## 2013-05-01 HISTORY — DX: Chronic kidney disease, stage 3 unspecified: N18.30

## 2013-05-01 MED ORDER — FUROSEMIDE 40 MG PO TABS: 40.0000 mg | ORAL_TABLET | Freq: Every day | ORAL | Status: DC

## 2013-05-01 NOTE — Patient Instructions (Signed)
Have lab work done Monday 05/06/13  Begin Lasix 40 mg daily  Weigh daily Call [phone] if weight climbs more than 3 pounds in a day or 5 pounds in a week. No salt to very little salt in your diet.  No more than 2000 mg in a day. Call if increased shortness of breath or increased swelling.   Follow up with Dr. Rennis Golden in 1-2 weeks.  We will call you the results of the Pulmonary function study.  They have to fax over to [place] the results.

## 2013-05-01 NOTE — Progress Notes (Signed)
05/01/2013   PCP: Carollee Herter, MD   Chief Complaint  Patient presents with  . Follow-up    Recent hospital visit " AF & CHF ". Reports SOB @ rest & w activity.    Primary Cardiologist: Dr. 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 He also has hyperlipidemia and hypertriglyceridemia with triglycerides of over 3000 in the past, morbid obesity, OSA on CPAP and recently atrial fibrillation. Most recent labs showed triglycerides of 306, HDL of 36, VLDL 61 and LDL of 89. He had an outpatient cardiac catheterization on Nov 18, 2010 that showed the grafts were widely patent. Because of a solitary kidney he did not have an LVgram. His last creatinine October 11, 2012 was 1.58. The BUN was 31. The patient was seen on October 10, 2012 and was found to be in atrial fibrillation. He was scheduled for repeat cardioversion and had been placed on Xarelto at that time. Dr. Rennis Golden did not feel it was safe without general anesthesia to do the TEE due to his history of obstructive sleep apnea and relatively thick neck. The patient also had interrupted his Xarelto for 1 day due to gingival bleeding. He did undergo successful cardioversion to sinus rhythm after several weeks of anticoagulation and reported that he felt better. Subsequently he had an echocardiogram which showed a newly reduced ejection fraction of 35%. This is in the setting of worsening shortness of breath. Dr. Rennis Golden recently started him on low-dose Lasix however his creatinine was rising above 3. He has never seen a nephrologist. As above, he is known to have a solitary kidney. 10/16  he returned for follow up and reported markedly increased shortness of breath without much improvement with diuresis. It is noted that he is in atrial fibrillation with rapid ventricular response. I suspect either  this or coronary bypass graft dysfunction may be contributing to his heart failure. He was noted to be in atrial fib on echo in Sept 2014.  EF 30-40%, mild to mod.MR, OA ok pressure 41 mmHg.  Pt admitted to [facility]. RHC was completed but LHC held due to rising Cr.  Lexiscan myoview completed.  Also DCCV to SR.  With abnormal myoview pt underwent LHC  Finding patent grafts. LVEDP 29 mmHg. Renal also followed.  Pt back today for follow up.  Still with SOB with exertion and tires easily.  No SOB at night.  No chest pain.  Labs stable with Na 137, K+ 4.4 BUN 38, Cr. 1.77 down from 2.55.    No Known Allergies  Current Outpatient Prescriptions  Medication Sig Dispense Refill  . acetaminophen (TYLENOL) 500 MG tablet Take 1,000 mg by mouth every 6 (six) hours as needed for pain.      Marland Kitchen allopurinol (ZYLOPRIM) 300 MG tablet take 1 tablet by mouth once daily  30 tablet  12  . amLODipine (NORVASC) 5 MG tablet take 1 tablet by mouth once daily  30 tablet  11  . aspirin EC 81 MG tablet Take 81 mg by mouth at bedtime.      . busPIRone (BUSPAR) 15 MG tablet take 1 tablet by mouth twice a day AT 10AM AND 5PM.  60 tablet  5  . cholecalciferol (VITAMIN D) 1000 UNITS tablet Take 1,000 Units by mouth daily.      Marland Kitchen  Choline Fenofibrate (TRILIPIX) 135 MG capsule Take 135 mg by mouth daily.      . citalopram (CELEXA) 20 MG tablet take 1 tablet by mouth once daily  30 tablet  11  . FORTESTA 10 MG/ACT (2%) GEL apply 4 PUMPS once daily to SKIN  60 g  5  . glucose blood test strip Use as instructed(THIS IS FOR THE ONETOUCH VERIO IQ )  100 each  PRN  . hydrALAZINE (APRESOLINE) 50 MG tablet Take 1 tablet (50 mg total) by mouth every 8 (eight) hours.  90 tablet  5  . Lansoprazole (PREVACID PO) Take 1 capsule by mouth at bedtime.      . levalbuterol (XOPENEX HFA) 45 MCG/ACT inhaler Inhale 1-2 puffs into the lungs every 4 (four) hours as needed for wheezing.  1 Inhaler  12  . metFORMIN (GLUMETZA) 500 MG (MOD) 24 hr tablet Take 1  tablet (500 mg total) by mouth daily with breakfast.  60 tablet  5  . metoprolol tartrate (LOPRESSOR) 25 MG tablet Take 0.5 tablets (12.5 mg total) by mouth 2 (two) times daily.  30 tablet  5  . omega-3 acid ethyl esters (LOVAZA) 1 G capsule Take 2 g by mouth 2 (two) times daily.      Letta Pate DELICA LANCETS FINE MISC 1 each by Does not apply route 2 (two) times daily.  100 each  PRN  . Rivaroxaban (XARELTO) 15 MG TABS tablet Take 15 mg by mouth daily.      Marland Kitchen SIMCOR 1000-40 MG TB24 take 1 tablet by mouth once daily  30 tablet  11  . furosemide (LASIX) 40 MG tablet Take 1 tablet (40 mg total) by mouth daily.  30 tablet  6   No current facility-administered medications for this visit.    Past Medical History  Diagnosis Date  . Hypertension   . Obesity     with a BMI of 35  . Gout   . GERD (gastroesophageal reflux disease)   . Depression   . ASHD (arteriosclerotic heart disease)     hx CABG 2005, patent grafts 04/2013  . Diabetes mellitus   . Hyperlipidemia   . Hypertriglyceridemia     TRIGS OVER 3000 IN THE PAST; MOST RECENT TRIGS OF 306, HDL 36, VLDL 61, LDL 89  . 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 History of tobacco abuse     PT QUIT 07/04/12  . OSA on CPAP   . Shortness of breath   . Nonischemic cardiomyopathy, ef 30-40% by echo 03/2013 down from 49%, was in atrial fib  at time of Echo 05/01/2013  . CKD (chronic kidney disease) stage 3, GFR 30-59 ml/min, recently was CKD stage 4 with diuressing. 05/01/2013  . Carotid disease, bilateral   . Atrial fibrillation with RVR- DCCV 04/19/13 to NSR 04/18/2013    DCCV 11/2012; 04/2013  . Chronic anticoagulation 04/18/2013    xarelto    Past Surgical History  Procedure Laterality Date  . Appendectomy  1983  . Cardioversion N/A 11/14/2012    Procedure: CARDIOVERSION;  Surgeon: [name], MD;  Location: [facility] ENDOSCOPY;  Service: Cardiovascular;  Laterality: N/A;  . Coronary artery bypass graft  05/11/04     X 4; HAS A VERY SHORT AORTA, ROUND DILATED HEART AS WELL AS A STERNAL ABNORMALITY   . Cardiac catheterization  11/18/10    GRAFTS WIDELY PATENT WITH NO HIGH-GRADE CAD  . Carotids  04/03/12  CAROTID DUPLEX; RIGHT BULB/PROXIMAL ICA: MILD AMT OF FIBROUS PLAQUE SLIGHTLY ELEVATING VELOCITIES WITHIN THE PROXIMAL SEGMENT OF THE INTERNAL CAROTID  ARTERY  . Nm myoview ltd  09/17/09    EF 49%; GLOBAL LV SYSTOLIC FUNCTION MILDLY REDUCED  . Cardiac catheterization  04/2013    patent grafts, also RHC done    WUJ:WJXBJYN:WG colds or fevers, 3 lb weight increase since  discharge Skin:no rashes or ulcers HEENT:no blurred vision, no congestion CV:see HPI PUL:see HPI GI:no diarrhea constipation or melena, no indigestion GU:no hematuria, no dysuria MS:no joint pain, no claudication Neuro:no syncope, no lightheadedness Endo:+ diabetes with stable glucose, no thyroid disease  PHYSICAL EXAM BP 150/60  Pulse 76  Ht 5\' 8"  (1.727 m)  Wt 244 lb 9.6 oz (110.95 kg)  BMI 37.2 kg/m2 General:Pleasant affect, NAD Skin:Warm and dry, brisk capillary refill HEENT:normocephalic, sclera clear, mucus membranes moist Neck:Thick,supple, no JVD, +  carotid bruits  Heart:S1S2 RRR without murmur, gallup, rub or click Lungs:clear without rales, rhonchi, or wheezes NFA:OZHYQ, soft, non tender, + BS, do not palpate liver spleen or masses Ext:no lower ext edema, 2+ pedal pulses, 2+ radial pulses Neuro:alert and oriented, MAE, follows commands, + facial symmetry  EKG:SR with LBBB and PVC- maintaining SR since DCCV  ASSESSMENT AND PLAN Acute systolic congestive heart failure, NYHA class 4 Improved with no edema today and clear lungs.  Though still with episodic SOB.  Is watching salt in his diet, currently not weighing daily.   Will begin weighing now.  Weight is up 3 pounds from discharge.     Atrial fibrillation with RVR- DCCV 04/19/13 to NSR Maintaining SR.  S/P CABG x 3 2005. Patent grafts 04/2013 No chest  pain  Hypertension associated with diabetes Elevated BP resuming diuretic.  Acute on chronic renal insufficiency Cr. pk of 2.55 with hospitalization.  Now back to 1.77 baseline, prior to diuresis and cardiac cath.  Will resume lasix.  Atrophy of left kidney, solitary kidney Improved renal function  Chronic anticoagulation Since at least April 2014, on Xarelto, no reports of bleeding.  Type II or unspecified type diabetes mellitus without mention of complication, not stated as uncontrolled Glucose stable   Resuming Lasix at 40 mg daily, will check BMP in one week.  He will follow up with Dr. Rennis Golden in 1-2 weeks.  His pulmonary function study done in the hospital is not yet read we will call the results.

## 2013-05-01 NOTE — Assessment & Plan Note (Signed)
Improved with no edema today and clear lungs.  Though still with episodic SOB.  Is watching salt in his diet, currently not weighing daily.   Will begin weighing now.  Weight is up 3 pounds from discharge.

## 2013-05-01 NOTE — Assessment & Plan Note (Signed)
Cr. pk of 2.55 with hospitalization.  Now back to 1.77 baseline, prior to diuresis and cardiac cath.  Will resume lasix.

## 2013-05-01 NOTE — Assessment & Plan Note (Signed)
Since at least April 2014, on Xarelto, no reports of bleeding.

## 2013-05-01 NOTE — Assessment & Plan Note (Signed)
Elevated BP resuming diuretic.

## 2013-05-01 NOTE — Assessment & Plan Note (Signed)
Improved renal function

## 2013-05-01 NOTE — Assessment & Plan Note (Signed)
Glucose stable.

## 2013-05-01 NOTE — Assessment & Plan Note (Signed)
No chest pain

## 2013-05-01 NOTE — Assessment & Plan Note (Signed)
Maintaining SR.    

## 2013-05-02 ENCOUNTER — Encounter: Payer: Self-pay | Admitting: Cardiology

## 2013-05-08 LAB — BASIC METABOLIC PANEL WITH GFR
Calcium: 9.2 mg/dL (ref 8.4–10.5)
Chloride: 101 mEq/L (ref 96–112)
Creat: 2.16 mg/dL — ABNORMAL HIGH (ref 0.50–1.35)
GFR, Est Non African American: 33 mL/min — ABNORMAL LOW

## 2013-05-08 LAB — PRO B NATRIURETIC PEPTIDE: Pro B Natriuretic peptide (BNP): 874.6 pg/mL — ABNORMAL HIGH (ref <126)

## 2013-05-10 ENCOUNTER — Encounter: Payer: Self-pay | Admitting: Internal Medicine

## 2013-05-10 ENCOUNTER — Ambulatory Visit (INDEPENDENT_AMBULATORY_CARE_PROVIDER_SITE_OTHER): Payer: Managed Care, Other (non HMO) | Admitting: Internal Medicine

## 2013-05-10 VITALS — BP 127/61 | HR 80 | Ht 68.0 in | Wt 242.1 lb

## 2013-05-10 DIAGNOSIS — I428 Other cardiomyopathies: Secondary | ICD-10-CM

## 2013-05-10 DIAGNOSIS — I480 Paroxysmal atrial fibrillation: Secondary | ICD-10-CM

## 2013-05-10 DIAGNOSIS — Z951 Presence of aortocoronary bypass graft: Secondary | ICD-10-CM

## 2013-05-10 DIAGNOSIS — I4891 Unspecified atrial fibrillation: Secondary | ICD-10-CM

## 2013-05-10 DIAGNOSIS — G4730 Sleep apnea, unspecified: Secondary | ICD-10-CM

## 2013-05-10 DIAGNOSIS — E669 Obesity, unspecified: Secondary | ICD-10-CM

## 2013-05-10 NOTE — Patient Instructions (Signed)
Continue Lasix.   Your physician recommends that you schedule a follow-up appointment in 3 months.

## 2013-05-10 NOTE — Progress Notes (Signed)
05/10/2013   PCP: Carollee Herter, MD   Chief Complaint  Patient presents with  . Follow-up    saw laura 10/29, stopped lasix yest (per recent labs); denies CP; reports SOB not as bad; denies edema    Primary Cardiologist: Dr. 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 He also has hyperlipidemia and hypertriglyceridemia with triglycerides of over 3000 in the past, morbid obesity, OSA on CPAP and recently atrial fibrillation. Most recent labs showed triglycerides of 306, HDL of 36, VLDL 61 and LDL of 89. He had an outpatient cardiac catheterization on Nov 18, 2010 that showed the grafts were widely patent. Because of a solitary kidney he did not have an LVgram. His last creatinine October 11, 2012 was 1.58. The BUN was 31. The patient was seen on October 10, 2012 and was found to be in atrial fibrillation. He was scheduled for repeat cardioversion and had been placed on Xarelto at that time. Dr. Rennis Golden did not feel it was safe without general anesthesia to do the TEE due to his history of obstructive sleep apnea and relatively thick neck. The patient also had interrupted his Xarelto for 1 day due to gingival bleeding. He did undergo successful cardioversion to sinus rhythm after several weeks of anticoagulation and reported that he felt better. Subsequently he had an echocardiogram which showed a newly reduced ejection fraction of 35%. This is in the setting of worsening shortness of breath. Dr. Rennis Golden recently started him on low-dose Lasix however his creatinine was rising above 3. He has never seen a nephrologist. As above, he is known to have a solitary kidney. 10/16  he returned for follow up and reported markedly increased shortness of breath without much improvement with diuresis. It is noted that he is in atrial fibrillation with rapid  ventricular response. I suspect either this or coronary bypass graft dysfunction may be contributing to his heart failure. He was noted to be in atrial fib on echo in Sept 2014.  EF 30-40%, mild to mod.MR, OA ok pressure 41 mmHg.  Pt admitted to [facility]. RHC was completed but LHC held due to rising Cr.  Lexiscan myoview completed.  Also DCCV to SR.  With abnormal myoview pt underwent LHC  Finding patent grafts. LVEDP 29 mmHg. Renal also followed.  He recently saw Nada Boozer, NP, he noted he was still short of breath. She recheck laboratory work which indicated improvement in his creatinine and stable labs.  She recommended resuming Lasix 40 mg daily and rechecking blood work prior to his visit with me. The repeat lab work did show an increase in creatinine up to 2.1, from 1.77. Although he reports taking Lasix every day that his weight has been incredibly stable. His shortness of breath is finally resolved. He feels like his energy level is much better. EKG today shows that he is maintaining sinus rhythm.   No Known Allergies  Current Outpatient Prescriptions  Medication Sig Dispense Refill  . acetaminophen (TYLENOL) 500 MG tablet Take 1,000 mg by mouth every 6 (six) hours as needed for pain.      Marland Kitchen allopurinol (ZYLOPRIM) 300 MG tablet take 1 tablet by mouth once daily  30 tablet  12  . amLODipine (NORVASC) 5 MG tablet take 1 tablet by mouth once daily  30  tablet  11  . aspirin EC 81 MG tablet Take 81 mg by mouth at bedtime.      . busPIRone (BUSPAR) 15 MG tablet take 1 tablet by mouth twice a day AT 10AM AND 5PM.  60 tablet  5  . cholecalciferol (VITAMIN D) 1000 UNITS tablet Take 1,000 Units by mouth daily.      . Choline Fenofibrate (TRILIPIX) 135 MG capsule Take 135 mg by mouth daily.      . citalopram (CELEXA) 20 MG tablet take 1 tablet by mouth once daily  30 tablet  11  . FORTESTA 10 MG/ACT (2%) GEL apply 4 PUMPS once daily to SKIN  60 g  5  . glucose blood test strip Use as instructed(THIS IS  FOR THE ONETOUCH VERIO IQ )  100 each  PRN  . Lansoprazole (PREVACID PO) Take 1 capsule by mouth at bedtime.      . levalbuterol (XOPENEX HFA) 45 MCG/ACT inhaler Inhale 1-2 puffs into the lungs every 4 (four) hours as needed for wheezing.  1 Inhaler  12  . metFORMIN (GLUMETZA) 500 MG (MOD) 24 hr tablet Take 1 tablet (500 mg total) by mouth daily with breakfast.  60 tablet  5  . metoprolol tartrate (LOPRESSOR) 25 MG tablet Take 0.5 tablets (12.5 mg total) by mouth 2 (two) times daily.  30 tablet  5  . omega-3 acid ethyl esters (LOVAZA) 1 G capsule Take 2 g by mouth 2 (two) times daily.      Letta Pate DELICA LANCETS FINE MISC 1 each by Does not apply route 2 (two) times daily.  100 each  PRN  . Rivaroxaban (XARELTO) 15 MG TABS tablet Take 15 mg by mouth daily.      Marland Kitchen SIMCOR 1000-40 MG TB24 take 1 tablet by mouth once daily  30 tablet  11  . furosemide (LASIX) 40 MG tablet Take 1 tablet (40 mg total) by mouth daily.  30 tablet  6   No current facility-administered medications for this visit.    Past Medical History  Diagnosis Date  . Hypertension   . Obesity     with a BMI of 35  . Gout   . GERD (gastroesophageal reflux disease)   . Depression   . ASHD (arteriosclerotic heart disease)     hx CABG 2005, patent grafts 04/2013  . Diabetes mellitus   . Hyperlipidemia   . Hypertriglyceridemia     TRIGS OVER 3000 IN THE PAST; MOST RECENT TRIGS OF 306, HDL 36, VLDL 61, LDL 89  . 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 History of tobacco abuse     PT QUIT 07/04/12  . OSA on CPAP   . Shortness of breath   . Nonischemic cardiomyopathy, ef 30-40% by echo 03/2013 down from 49%, was in atrial fib  at time of Echo 05/01/2013  . CKD (chronic kidney disease) stage 3, GFR 30-59 ml/min, recently was CKD stage 4 with diuressing. 05/01/2013  . Carotid disease, bilateral   . Atrial fibrillation with RVR- DCCV 04/19/13 to NSR 04/18/2013    DCCV 11/2012; 04/2013  . Chronic  anticoagulation 04/18/2013    xarelto    Past Surgical History  Procedure Laterality Date  . Appendectomy  1983  . Cardioversion N/A 11/14/2012    Procedure: CARDIOVERSION;  Surgeon: [name], MD;  Location: [facility] ENDOSCOPY;  Service: Cardiovascular;  Laterality: N/A;  . Coronary artery bypass graft  05/11/04  X 4; HAS A VERY SHORT AORTA, ROUND DILATED HEART AS WELL AS A STERNAL ABNORMALITY   . Cardiac catheterization  11/18/10    GRAFTS WIDELY PATENT WITH NO HIGH-GRADE CAD  . Carotids  04/03/12    CAROTID DUPLEX; RIGHT BULB/PROXIMAL ICA: MILD AMT OF FIBROUS PLAQUE SLIGHTLY ELEVATING VELOCITIES WITHIN THE PROXIMAL SEGMENT OF THE INTERNAL CAROTID  ARTERY  . Nm myoview ltd  09/17/09    EF 49%; GLOBAL LV SYSTOLIC FUNCTION MILDLY REDUCED  . Cardiac catheterization  04/2013    patent grafts, also RHC done    AOZ:HYQMVHQ:IO colds or fevers, weight stable Skin:no rashes or ulcers HEENT:no blurred vision, no congestion CV: normal rhythm, no palpitations NGE:[id number] has improved GI:no diarrhea constipation or melena, no indigestion GU:no hematuria, no dysuria MS:no joint pain, no claudication Neuro:no syncope, no lightheadedness Endo:+ diabetes with stable glucose, no thyroid disease  PHYSICAL EXAM BP 127/61  Pulse 80  Ht 5\' 8"  (1.727 m)  Wt 242 lb 1.6 oz (109.816 kg)  BMI 36.82 kg/m2 General:Pleasant affect, NAD Skin:Warm and dry, brisk capillary refill HEENT:normocephalic, sclera clear, mucus membranes moist Neck:Thick,supple, no JVD, +  carotid bruits  Heart:S1S2 RRR without murmur, gallup, rub or click Lungs:clear without rales, rhonchi, or wheezes MWN:UUVOZ, soft, non tender, + BS, do not palpate liver spleen or masses Ext:no lower ext edema, 2+ pedal pulses, 2+ radial pulses Neuro:alert and oriented, MAE, follows commands, + facial symmetry  EKG: deferred  ASSESSMENT AND PLAN Patient Active Problem List   Diagnosis Date Noted  . Nonischemic cardiomyopathy, ef  30-40% by echo 03/2013 down from 49%, was in atrial fib  at time of Echo 05/01/2013  . CKD (chronic kidney disease) stage 3, GFR 30-59 ml/min, recently was CKD stage 4 with diuressing. 05/01/2013  . Carotid disease, bilateral   . Acute on chronic renal insufficiency 04/21/2013  . Atrial fibrillation with RVR- DCCV 04/19/13 to NSR 04/18/2013  . Chronic anticoagulation 04/18/2013  . Acute systolic congestive heart failure, NYHA class 4 04/18/2013  . S/P CABG x 3 2005. Patent grafts 04/2013 04/18/2013  . Obesity: BMI 36.8 11/27/2012  . Paroxysmal atrial fibrillation 10/15/2012  . Type II or unspecified type diabetes mellitus without mention of complication, not stated as uncontrolled 07/07/2011  . Hypogonadism male 06/23/2011  . Hypertension associated with diabetes 11/25/2010  . Hyperlipidemia LDL goal <70 11/25/2010  . Sleep apnea- on C-pap 11/25/2010  . Depression 11/25/2010  . Atrophy of left kidney, solitary kidney 11/25/2010  . 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  Mr. Morella is shortness of breath is finally improved. His weight has been very stable and seems to be tolerating low-dose Lasix. His creatinine has risen up, however he does have one kidney and has stage III chronic kidney disease. I suspect the creatinine of 1.77 was unusually low for him and that we need to tolerate slightly higher creatinine with keeping him on the Lasix and ordered keep him euvolemic and without dyspnea. He does appear to be maintaining sinus rhythm and feels the best he has in some time. He does have a followup with nephrology and I've encouraged her to keep that appointment. I will plan to see him back in 3 months for close followup.  [name], MD, [facility] Attending Cardiologist [facility]

## 2013-05-13 ENCOUNTER — Encounter: Payer: Self-pay | Admitting: Internal Medicine

## 2013-05-27 ENCOUNTER — Encounter: Payer: Self-pay | Admitting: Internal Medicine

## 2013-05-27 ENCOUNTER — Ambulatory Visit (INDEPENDENT_AMBULATORY_CARE_PROVIDER_SITE_OTHER): Payer: Managed Care, Other (non HMO) | Admitting: Internal Medicine

## 2013-05-27 VITALS — BP 136/78 | HR 118 | Ht 68.0 in | Wt 245.4 lb

## 2013-05-27 DIAGNOSIS — N269 Renal sclerosis, unspecified: Secondary | ICD-10-CM

## 2013-05-27 DIAGNOSIS — I4891 Unspecified atrial fibrillation: Secondary | ICD-10-CM

## 2013-05-27 DIAGNOSIS — N183 Chronic kidney disease, stage 3 unspecified: Secondary | ICD-10-CM

## 2013-05-27 DIAGNOSIS — G4730 Sleep apnea, unspecified: Secondary | ICD-10-CM

## 2013-05-27 DIAGNOSIS — Q964 Mosaicism, 45, X/other cell line(s) with abnormal sex chromosome: Secondary | ICD-10-CM

## 2013-05-27 DIAGNOSIS — I5021 Acute systolic (congestive) heart failure: Secondary | ICD-10-CM

## 2013-05-27 DIAGNOSIS — I509 Heart failure, unspecified: Secondary | ICD-10-CM

## 2013-05-27 DIAGNOSIS — N261 Atrophy of kidney (terminal): Secondary | ICD-10-CM

## 2013-05-27 DIAGNOSIS — Z951 Presence of aortocoronary bypass graft: Secondary | ICD-10-CM

## 2013-05-27 DIAGNOSIS — I428 Other cardiomyopathies: Secondary | ICD-10-CM

## 2013-05-27 MED ORDER — METOPROLOL TARTRATE 25 MG PO TABS: 25.0000 mg | ORAL_TABLET | Freq: Two times a day (BID) | ORAL | Status: DC

## 2013-05-27 MED ORDER — FUROSEMIDE 40 MG PO TABS: 60.0000 mg | ORAL_TABLET | Freq: Every day | ORAL | Status: DC

## 2013-05-27 NOTE — Patient Instructions (Signed)
Increase metoprolol (Lopressor) to 25mg  (1 tablet) twice daily.  Increase furosemide (Lasix) to 60mg  (1.5 tablets) once daily.  You have been referred to Dr. Johney Frame at [facility] on [facility] for possible atrial fibrillation ablation.  Your physician recommends that you schedule a follow-up appointment in: 1 month.

## 2013-05-27 NOTE — Progress Notes (Signed)
05/27/2013   PCP: Carollee Herter, MD   Chief Complaint  Patient presents with  . Follow-up    3 weeks; reports SOB, mostly on exertion, thinks may be in afib - has noticed in last 2 weeks    Primary Cardiologist: Dr. 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 He also has hyperlipidemia and hypertriglyceridemia with triglycerides of over 3000 in the past, morbid obesity, OSA on CPAP and recently atrial fibrillation. Most recent labs showed triglycerides of 306, HDL of 36, VLDL 61 and LDL of 89. He had an outpatient cardiac catheterization on Nov 18, 2010 that showed the grafts were widely patent. Because of a solitary kidney he did not have an LVgram. His last creatinine October 11, 2012 was 1.58. The BUN was 31. The patient was seen on October 10, 2012 and was found to be in atrial fibrillation. He was scheduled for repeat cardioversion and had been placed on Xarelto at that time. Dr. Rennis Golden did not feel it was safe without general anesthesia to do the TEE due to his history of obstructive sleep apnea and relatively thick neck. The patient also had interrupted his Xarelto for 1 day due to gingival bleeding. He did undergo successful cardioversion to sinus rhythm after several weeks of anticoagulation and reported that he felt better. Subsequently he had an echocardiogram which showed a newly reduced ejection fraction of 35%. This is in the setting of worsening shortness of breath. Dr. Rennis Golden recently started him on low-dose Lasix however his creatinine was rising above 3. He has never seen a nephrologist. As above, he is known to have a solitary kidney. 10/16  he returned for follow up and reported markedly increased shortness of breath without much improvement with diuresis. It is noted that he is in atrial fibrillation with rapid ventricular response. I  suspect either this or coronary bypass graft dysfunction may be contributing to his heart failure. He was noted to be in atrial fib on echo in Sept 2014.  EF 30-40%, mild to mod.MR, OA ok pressure 41 mmHg.  Pt admitted to [facility]. RHC was completed but LHC held due to rising Cr.  Lexiscan myoview completed.  Also DCCV to SR.  With abnormal myoview pt underwent LHC  Finding patent grafts. LVEDP 29 mmHg. Renal also followed.  He recently saw Nada Boozer, NP, he noted he was still short of breath. She recheck laboratory work which indicated improvement in his creatinine and stable labs.  She recommended resuming Lasix 40 mg daily and rechecking blood work prior to his visit with me. The repeat lab work did show an increase in creatinine up to 2.1, from 1.77. Although he reports taking Lasix every day that his weight has been incredibly stable. He is shortness of breath had completely resolved at his last office visit only a few weeks ago. Subsequently he reports that he's felt tachypalpitations and worsening shortness of breath and fatigue. He made an earlier appointment and indeed is back in atrial fibrillation today with rapid ventricular response.  He also reports that he has had 3-4 pound weight gain over the past several weeks.  No Known Allergies  Current Outpatient Prescriptions  Medication Sig Dispense Refill  . acetaminophen (TYLENOL) 500 MG tablet Take 1,000 mg by mouth every 6 (six) hours as needed for pain.      Marland Kitchen  allopurinol (ZYLOPRIM) 300 MG tablet take 1 tablet by mouth once daily  30 tablet  12  . amLODipine (NORVASC) 5 MG tablet take 1 tablet by mouth once daily  30 tablet  11  . aspirin EC 81 MG tablet Take 81 mg by mouth at bedtime.      . busPIRone (BUSPAR) 15 MG tablet take 1 tablet by mouth twice a day AT 10AM AND 5PM.  60 tablet  5  . cholecalciferol (VITAMIN D) 1000 UNITS tablet Take 1,000 Units by mouth daily.      . Choline Fenofibrate (TRILIPIX) 135 MG capsule Take 135 mg by mouth  daily.      . citalopram (CELEXA) 20 MG tablet take 1 tablet by mouth once daily  30 tablet  11  . FORTESTA 10 MG/ACT (2%) GEL apply 4 PUMPS once daily to SKIN  60 g  5  . furosemide (LASIX) 40 MG tablet Take 1.5 tablets (60 mg total) by mouth daily.  45 tablet  6  . glucose blood test strip Use as instructed(THIS IS FOR THE ONETOUCH VERIO IQ )  100 each  PRN  . Lansoprazole (PREVACID PO) Take 1 capsule by mouth at bedtime.      . levalbuterol (XOPENEX HFA) 45 MCG/ACT inhaler Inhale 1-2 puffs into the lungs every 4 (four) hours as needed for wheezing.  1 Inhaler  12  . metFORMIN (GLUMETZA) 500 MG (MOD) 24 hr tablet Take 1 tablet (500 mg total) by mouth daily with breakfast.  60 tablet  5  . metoprolol tartrate (LOPRESSOR) 25 MG tablet Take 1 tablet (25 mg total) by mouth 2 (two) times daily.  60 tablet  6  . omega-3 acid ethyl esters (LOVAZA) 1 G capsule Take 2 g by mouth 2 (two) times daily.      Letta Pate DELICA LANCETS FINE MISC 1 each by Does not apply route 2 (two) times daily.  100 each  PRN  . Rivaroxaban (XARELTO) 15 MG TABS tablet Take 15 mg by mouth daily.      Marland Kitchen SIMCOR 1000-40 MG TB24 take 1 tablet by mouth once daily  30 tablet  11   No current facility-administered medications for this visit.    Past Medical History  Diagnosis Date  . Hypertension   . Obesity     with a BMI of 35  . Gout   . GERD (gastroesophageal reflux disease)   . Depression   . ASHD (arteriosclerotic heart disease)     hx CABG 2005, patent grafts 04/2013  . Diabetes mellitus   . Hyperlipidemia   . Hypertriglyceridemia     TRIGS OVER 3000 IN THE PAST; MOST RECENT TRIGS OF 306, HDL 36, VLDL 61, LDL 89  . 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 History of tobacco abuse     PT QUIT 07/04/12  . OSA on CPAP   . Shortness of breath   . Nonischemic cardiomyopathy, ef 30-40% by echo 03/2013 down from 49%, was in atrial fib  at time of Echo 05/01/2013  . CKD (chronic kidney disease)  stage 3, GFR 30-59 ml/min, recently was CKD stage 4 with diuressing. 05/01/2013  . Carotid disease, bilateral   . Atrial fibrillation with RVR- DCCV 04/19/13 to NSR 04/18/2013    DCCV 11/2012; 04/2013  . Chronic anticoagulation 04/18/2013    xarelto    Past Surgical History  Procedure Laterality Date  . Appendectomy  1983  . Cardioversion  N/A 11/14/2012    Procedure: CARDIOVERSION;  Surgeon: [name], MD;  Location: [facility] ENDOSCOPY;  Service: Cardiovascular;  Laterality: N/A;  . Coronary artery bypass graft  05/11/04    X 4; HAS A VERY SHORT AORTA, ROUND DILATED HEART AS WELL AS A STERNAL ABNORMALITY   . Cardiac catheterization  11/18/10    GRAFTS WIDELY PATENT WITH NO HIGH-GRADE CAD  . Carotids  04/03/12    CAROTID DUPLEX; RIGHT BULB/PROXIMAL ICA: MILD AMT OF FIBROUS PLAQUE SLIGHTLY ELEVATING VELOCITIES WITHIN THE PROXIMAL SEGMENT OF THE INTERNAL CAROTID  ARTERY  . Nm myoview ltd  09/17/09    EF 49%; GLOBAL LV SYSTOLIC FUNCTION MILDLY REDUCED  . Cardiac catheterization  04/2013    patent grafts, also RHC done    ZOX:[id number]:UJ colds or fevers, weight gain Skin:no rashes or ulcers HEENT:no blurred vision, no congestion CV: normal rhythm, palpitations WJX:BJYNWGN GI:no diarrhea constipation or melena, no indigestion GU:no hematuria, no dysuria MS:no joint pain, no claudication Neuro:no syncope, no lightheadedness Endo:+ diabetes with stable glucose, no thyroid disease  PHYSICAL EXAM BP 136/78  Pulse 118  Ht 5\' 8"  (1.727 m)  Wt 245 lb 6.4 oz (111.313 kg)  BMI 37.32 kg/m2 General:Pleasant affect, NAD Skin:Warm and dry, brisk capillary refill HEENT:normocephalic, sclera clear, mucus membranes moist Neck:Thick,supple, no JVD, +  carotid bruits  Heart:S1S2 RRR without murmur, gallup, rub or click Lungs:clear without rales, rhonchi, or wheezes FAO:ZHYQM, soft, non tender, + BS, do not palpate liver spleen or masses Ext:no lower ext edema, 2+ pedal pulses, 2+ radial  pulses Neuro:alert and oriented, MAE, follows commands, + facial symmetry  EKG: deferred  ASSESSMENT AND PLAN Patient Active Problem List   Diagnosis Date Noted  . Nonischemic cardiomyopathy, ef 30-40% by echo 03/2013 down from 49%, was in atrial fib  at time of Echo 05/01/2013  . CKD (chronic kidney disease) stage 3, GFR 30-59 ml/min, recently was CKD stage 4 with diuressing. 05/01/2013  . Carotid disease, bilateral   . Acute on chronic renal insufficiency 04/21/2013  . Atrial fibrillation with RVR- DCCV 04/19/13 to NSR 04/18/2013  . Chronic anticoagulation 04/18/2013  . Acute systolic congestive heart failure, NYHA class 4 04/18/2013  . S/P CABG x 3 2005. Patent grafts 04/2013 04/18/2013  . Obesity: BMI 36.8 11/27/2012  . Paroxysmal atrial fibrillation 10/15/2012  . Type II or unspecified type diabetes mellitus without mention of complication, not stated as uncontrolled 07/07/2011  . Hypogonadism male 06/23/2011  . Hypertension associated with diabetes 11/25/2010  . Hyperlipidemia LDL goal <70 11/25/2010  . Sleep apnea- on C-pap 11/25/2010  . Depression 11/25/2010  . Atrophy of left kidney, solitary kidney 11/25/2010  . 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 He's highly symptomatic with the A. fib and reports worsening shortness of breath. He's also had 3-4 pound weight gain and feels more fatigued. He did not hold sinus rhythm long after cardioversion. Unfortunately, antiarrhythmic therapy may be quite complicated given his comorbidities. He does have structural heart disease and known bypass grafts. He also has an ischemic cardiomyopathy with an EF about 35%. This limits the antiarrythmic medications that he could use significantly. Out of the options available, tedious and may be the most helpful, however he does have a solitary kidney with advanced chronic  kidney disease which may make this difficult to use. Multipack is contraindicated due to recent class IV heart  failure and amiodarone is less desirable due to his young age and the projection of long-term use. I would like him evaluated by cardiac electrophysiology to see if he is a candidate for AF ablation which may be his best option. In the meantime, I would recommend increasing his metoprolol to 25 mg twice daily for better rate control. In addition he should increase his Lasix to 60 mg daily. Plan to see him back in one month hopefully after he has been evaluated by Dr. Johney Frame.  [name], MD, [facility] Attending Cardiologist [facility]

## 2013-05-29 ENCOUNTER — Encounter: Payer: Self-pay | Admitting: Internal Medicine

## 2013-05-29 ENCOUNTER — Ambulatory Visit (INDEPENDENT_AMBULATORY_CARE_PROVIDER_SITE_OTHER): Payer: Managed Care, Other (non HMO) | Admitting: Internal Medicine

## 2013-05-29 VITALS — BP 149/82 | HR 99 | Ht 68.0 in | Wt 246.8 lb

## 2013-05-29 DIAGNOSIS — E1159 Type 2 diabetes mellitus with other circulatory complications: Secondary | ICD-10-CM

## 2013-05-29 DIAGNOSIS — Z7901 Long term (current) use of anticoagulants: Secondary | ICD-10-CM

## 2013-05-29 DIAGNOSIS — I428 Other cardiomyopathies: Secondary | ICD-10-CM

## 2013-05-29 DIAGNOSIS — I4891 Unspecified atrial fibrillation: Secondary | ICD-10-CM

## 2013-05-29 DIAGNOSIS — I4819 Other persistent atrial fibrillation: Secondary | ICD-10-CM

## 2013-05-29 DIAGNOSIS — I10 Essential (primary) hypertension: Secondary | ICD-10-CM

## 2013-05-29 DIAGNOSIS — E1169 Type 2 diabetes mellitus with other specified complication: Secondary | ICD-10-CM

## 2013-05-29 DIAGNOSIS — Z951 Presence of aortocoronary bypass graft: Secondary | ICD-10-CM

## 2013-05-29 MED ORDER — AMIODARONE HCL 200 MG PO TABS: ORAL_TABLET | ORAL | Status: DC

## 2013-05-29 NOTE — Progress Notes (Signed)
/ Primary Care Physician: Carollee Herter, MD Referring Physician: Wendel Homeyer is a 57 y.o. male with a h/o coronary artery disease (s/p CABG), hypertension, hyperlipidemia, diabetes, solitary kidney (dx at age 39), renal insuffiency, sleep apnea (compliant with CPAP), degenerative disc disease, and persistent atrial fibrillation.  He states he was first diagnosed with atrial fibrillation in May of this year.  He was cardioverted at that time and thinks he was in SR for a few weeks.  He had symptomatic improvement with SR.  He then underwent repeat cardioversion 3 weeks ago which lasted about 10 days.  He is symptomatic with his atrial fibrillation with shortness of breath and fatigue.  He denies chest pain, dizziness, or tachy palpitations.  He is referred today for treatment options of atrial fibrillation. He has not tried AAD therapy.  He works as a Psychologist, occupational.  He is single.  He quit smoking 1 year ago.  He has not used alcohol or recreational drugs for the last 23 years.  He does not due any routine exercise due to fatigue with atrial fibrillation but considers himself to be a fairly active person.  Last echo in 03/2013 demonstrated an EF of 35% with eccentric hypertrophy and global hypokinesis; mild to moderate MR; moderate TR; LA 40.   Nuclear study 04/2013 demonstrated an EF of 37% with no ischemia.  Right heart cath at that time demonstrated severely elevated LVEDP and moderate secondary pulmonary hypertension.    Today, he denies symptoms of palpitations, chest pain,  orthopnea, PND, lower extremity edema, dizziness, presyncope, syncope, or neurologic sequela. The patient is tolerating medications without difficulties a/nd is otherwise without complaint today.   Past Medical History  Diagnosis Date  . Hypertension   . Obesity     with a BMI of 35  . Gout   . GERD (gastroesophageal reflux disease)   . Depression   . ASHD (arteriosclerotic heart disease)     hx  CABG 2005, patent grafts 04/2013  . Diabetes mellitus   . Hyperlipidemia   . Hypertriglyceridemia     TRIGS OVER 3000 IN THE PAST; MOST RECENT TRIGS OF 306, HDL 36, VLDL 61, LDL 89  . 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 History of tobacco abuse     PT QUIT 07/04/12  . OSA on CPAP   . Shortness of breath   . Nonischemic cardiomyopathy, ef 30-40% by echo 03/2013 down from 49%, was in atrial fib  at time of Echo 05/01/2013  . CKD (chronic kidney disease) stage 3, GFR 30-59 ml/min, recently was CKD stage 4 with diuressing. 05/01/2013  . Carotid disease, bilateral   . Persistent atrial fibrillation 04/18/2013    DCCV 11/2012; 04/2013  . Chronic anticoagulation 04/18/2013    xarelto   Past Surgical History  Procedure Laterality Date  . Appendectomy  1983  . Cardioversion N/A 11/14/2012    Procedure: CARDIOVERSION;  Surgeon: [name], MD;  Location: [facility] ENDOSCOPY;  Service: Cardiovascular;  Laterality: N/A;  . Coronary artery bypass graft  05/11/04    X 4; HAS A VERY SHORT AORTA, ROUND DILATED HEART AS WELL AS A STERNAL ABNORMALITY   . Cardiac catheterization  11/18/10    GRAFTS WIDELY PATENT WITH NO HIGH-GRADE CAD  . Carotids  04/03/12    CAROTID DUPLEX; RIGHT BULB/PROXIMAL ICA: MILD AMT OF FIBROUS PLAQUE SLIGHTLY ELEVATING VELOCITIES WITHIN THE PROXIMAL SEGMENT OF THE INTERNAL CAROTID  ARTERY  .  Nm myoview ltd  09/17/09    EF 49%; GLOBAL LV SYSTOLIC FUNCTION MILDLY REDUCED  . Cardiac catheterization  04/2013    patent grafts, also RHC done    Current Outpatient Prescriptions  Medication Sig Dispense Refill  . acetaminophen (TYLENOL) 500 MG tablet Take 1,000 mg by mouth every 6 (six) hours as needed for pain.      Marland Kitchen allopurinol (ZYLOPRIM) 300 MG tablet take 1 tablet by mouth once daily  30 tablet  12  . amLODipine (NORVASC) 5 MG tablet take 1 tablet by mouth once daily  30 tablet  11  . aspirin EC 81 MG tablet Take 81 mg by mouth at bedtime.      . busPIRone  (BUSPAR) 15 MG tablet take 1 tablet by mouth twice a day AT 10AM AND 5PM.  60 tablet  5  . cholecalciferol (VITAMIN D) 1000 UNITS tablet Take 1,000 Units by mouth daily.      . Choline Fenofibrate (TRILIPIX) 135 MG capsule Take 135 mg by mouth daily.      . citalopram (CELEXA) 20 MG tablet take 1 tablet by mouth once daily  30 tablet  11  . FORTESTA 10 MG/ACT (2%) GEL apply 4 PUMPS once daily to SKIN  60 g  5  . furosemide (LASIX) 40 MG tablet Take 1.5 tablets (60 mg total) by mouth daily.  45 tablet  6  . glucose blood test strip Use as instructed(THIS IS FOR THE ONETOUCH VERIO IQ )  100 each  PRN  . Lansoprazole (PREVACID PO) Take 1 capsule by mouth at bedtime.      . levalbuterol (XOPENEX HFA) 45 MCG/ACT inhaler Inhale 1-2 puffs into the lungs every 4 (four) hours as needed for wheezing.  1 Inhaler  12  . metFORMIN (GLUMETZA) 500 MG (MOD) 24 hr tablet Take 1 tablet (500 mg total) by mouth daily with breakfast.  60 tablet  5  . metoprolol tartrate (LOPRESSOR) 25 MG tablet Take 1 tablet (25 mg total) by mouth 2 (two) times daily.  60 tablet  6  . omega-3 acid ethyl esters (LOVAZA) 1 G capsule Take 2 g by mouth 2 (two) times daily.      Letta Pate DELICA LANCETS FINE MISC 1 each by Does not apply route 2 (two) times daily.  100 each  PRN  . Rivaroxaban (XARELTO) 15 MG TABS tablet Take 15 mg by mouth daily.      Marland Kitchen SIMCOR 1000-40 MG TB24 take 1 tablet by mouth once daily  30 tablet  11  . amiodarone (PACERONE) 200 MG tablet Take 2 tablets by mouth twice daily for 2 weeks then decrease to one tablet twice daily  90 tablet  3   No current facility-administered medications for this visit.    No Known Allergies  History   Social History  . Marital Status: Single    Spouse Name: N/A    Number of Children: N/A  . Years of Education: N/A   Occupational History  . Psychologist, occupational (for labs)     [employer]AVEL[employer] FOR HIS JOB   Social History Main Topics  . Smoking status: Former Smoker -- 1.00  packs/day    Types: Cigarettes    Quit date: 07/04/2012  . Smokeless tobacco: Never Used  . 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 Drug Use: No  . Sexual Activity: Yes   Other Topics Concern  . Not on file  Social History Narrative   3 YRS COLLEGE      DOES NOT EXERCISE      TRAVELS FREQUENTLY AROUND THE COUNTRY FOR HIS JOB; [place], [place], APPROX 3 WEEKS OUT OF EACH MONTH; USUALLY LESS THAN 2 HOURS ON A PLANE          Family History  Problem Relation Age of Onset  . Heart disease Father   . Cancer Father 38    LUNG  . Hypertension Father   . Diabetes Brother   . Stroke Maternal Grandmother 60  . Heart disease Paternal Grandmother     ROS- All systems are reviewed and negative except as per the HPI above  Physical Exam: Filed Vitals:   05/29/13 1541  BP: 149/82  Pulse: 99  Height: 5\' 8"  (1.727 m)  Weight: 246 lb 12.8 oz (111.948 kg)    GEN- The patient is well appearing, alert and oriented x 3 today.   Head- normocephalic, atraumatic Eyes-  Sclera clear, conjunctiva pink Ears- hearing intact Oropharynx- clear Neck- supple, no JVP Lymph- no cervical lymphadenopathy Lungs- Clear to ausculation bilaterally, normal work of breathing Heart- irregular rate and rhythm, no murmurs, rubs or gallops, PMI not laterally displaced GI- soft, NT, ND, + BS Extremities- no clubbing, cyanosis, or edema MS- no significant deformity or atrophy Skin- no rash or lesion Psych- euthymic mood, full affect Neuro- strength and sensation are intact  EKG- atrial fibrillation, ventricular rate 99, IVCD with QRS 140 msec, QTc 495 msec  Assessment and Plan:   1. Persistent atrial fibrillation The patient has symptomatic persistent atrial fibrillation.  He had not tried AAD therapy.  Therapeutic strategies for afib including rate control and rhythm control and ablation were discussed at length today.  Per guidelines, he should try AAD therapy prior to ablation  for persistent afib.  His AAD therapy options are limited to tikosyn and amiodarone.  Given his QT and baseline renal function is not a very good candidate for tikosyn either.  Risks, benefits, and alternatives to both tikosyn and amiodarone were discussed at length today.  He wishes to proceed with amiodarone.  I think that this is an appropriate option at this time to allow for atrial remodeling with sinus rhythm.  We may then consider ablation long term depending on his response to amiodarone. His CHADS2VASC score is at least 4.  He is appropriately anticoagulated with xarelto. I will start amiodarone 400mg  BID x 2 weeks then 200mg  BID thereafter.  He will follow-up with Dr Rennis Golden as scheduled in 4 weeks.  IF he remains in afib at that time, then I will ask that Dr Rennis Golden proceed with repeat cardioversion. I will see him back in 2 months to assess response to amiodarone.  2. HTN Stable No change required today  3. CAD Stable No change required today  4. Nonischemic CM Hopefully, his EF may improve with sinus rhythm Continue to optimize medical therapy and reassess EF once he has been in sinus rhythm for 3 months  Return to see me in 8 weeks

## 2013-05-29 NOTE — Patient Instructions (Signed)
Your physician recommends that you schedule a follow-up appointment in: 2 months with Dr Johney Frame   Your physician has recommended you make the following change in your medication:  1) Start Amiodarone 200mg  take 2 tablets by mouth twice daily for 2 weeks then decrease to 200mg  twice daily

## 2013-06-10 ENCOUNTER — Encounter: Payer: Self-pay | Admitting: Internal Medicine

## 2013-06-10 ENCOUNTER — Inpatient Hospital Stay (HOSPITAL_COMMUNITY)
Admission: AD | Admit: 2013-06-10 | Discharge: 2013-06-13 | DRG: 308 | Disposition: A | Discharge status: Home / Self Care | Payer: Managed Care, Other (non HMO) | Source: Ambulatory Visit | Attending: Internal Medicine | Admitting: Internal Medicine

## 2013-06-10 ENCOUNTER — Ambulatory Visit (INDEPENDENT_AMBULATORY_CARE_PROVIDER_SITE_OTHER): Payer: Managed Care, Other (non HMO) | Admitting: Internal Medicine

## 2013-06-10 ENCOUNTER — Encounter (HOSPITAL_COMMUNITY): Payer: Self-pay | Admitting: Cardiology

## 2013-06-10 ENCOUNTER — Inpatient Hospital Stay (HOSPITAL_COMMUNITY): Payer: Managed Care, Other (non HMO)

## 2013-06-10 VITALS — BP 144/60 | HR 77 | Ht 68.0 in | Wt 242.2 lb

## 2013-06-10 DIAGNOSIS — I5021 Acute systolic (congestive) heart failure: Secondary | ICD-10-CM

## 2013-06-10 DIAGNOSIS — Z951 Presence of aortocoronary bypass graft: Secondary | ICD-10-CM

## 2013-06-10 DIAGNOSIS — J708 Respiratory conditions due to other specified external agents: Secondary | ICD-10-CM

## 2013-06-10 DIAGNOSIS — R7309 Other abnormal glucose: Secondary | ICD-10-CM | POA: Diagnosis present

## 2013-06-10 DIAGNOSIS — E119 Type 2 diabetes mellitus without complications: Secondary | ICD-10-CM

## 2013-06-10 DIAGNOSIS — J449 Chronic obstructive pulmonary disease, unspecified: Secondary | ICD-10-CM | POA: Diagnosis present

## 2013-06-10 DIAGNOSIS — I4891 Unspecified atrial fibrillation: Principal | ICD-10-CM

## 2013-06-10 DIAGNOSIS — Z6835 Body mass index (BMI) 35.0-35.9, adult: Secondary | ICD-10-CM

## 2013-06-10 DIAGNOSIS — R739 Hyperglycemia, unspecified: Secondary | ICD-10-CM | POA: Diagnosis not present

## 2013-06-10 DIAGNOSIS — J849 Interstitial pulmonary disease, unspecified: Secondary | ICD-10-CM

## 2013-06-10 DIAGNOSIS — M109 Gout, unspecified: Secondary | ICD-10-CM | POA: Diagnosis present

## 2013-06-10 DIAGNOSIS — F329 Major depressive disorder, single episode, unspecified: Secondary | ICD-10-CM | POA: Diagnosis present

## 2013-06-10 DIAGNOSIS — I428 Other cardiomyopathies: Secondary | ICD-10-CM

## 2013-06-10 DIAGNOSIS — Z7982 Long term (current) use of aspirin: Secondary | ICD-10-CM

## 2013-06-10 DIAGNOSIS — I2510 Atherosclerotic heart disease of native coronary artery without angina pectoris: Secondary | ICD-10-CM | POA: Diagnosis present

## 2013-06-10 DIAGNOSIS — N289 Disorder of kidney and ureter, unspecified: Secondary | ICD-10-CM

## 2013-06-10 DIAGNOSIS — Z7901 Long term (current) use of anticoagulants: Secondary | ICD-10-CM

## 2013-06-10 DIAGNOSIS — T380X5A Adverse effect of glucocorticoids and synthetic analogues, initial encounter: Secondary | ICD-10-CM | POA: Diagnosis not present

## 2013-06-10 DIAGNOSIS — I658 Occlusion and stenosis of other precerebral arteries: Secondary | ICD-10-CM | POA: Diagnosis present

## 2013-06-10 DIAGNOSIS — Z833 Family history of diabetes mellitus: Secondary | ICD-10-CM

## 2013-06-10 DIAGNOSIS — G4733 Obstructive sleep apnea (adult) (pediatric): Secondary | ICD-10-CM | POA: Diagnosis present

## 2013-06-10 DIAGNOSIS — T466X5A Adverse effect of antihyperlipidemic and antiarteriosclerotic drugs, initial encounter: Secondary | ICD-10-CM

## 2013-06-10 DIAGNOSIS — E785 Hyperlipidemia, unspecified: Secondary | ICD-10-CM | POA: Diagnosis present

## 2013-06-10 DIAGNOSIS — F3289 Other specified depressive episodes: Secondary | ICD-10-CM | POA: Diagnosis present

## 2013-06-10 DIAGNOSIS — I369 Nonrheumatic tricuspid valve disorder, unspecified: Secondary | ICD-10-CM

## 2013-06-10 DIAGNOSIS — Q602 Renal agenesis, unspecified: Secondary | ICD-10-CM

## 2013-06-10 DIAGNOSIS — R0902 Hypoxemia: Secondary | ICD-10-CM

## 2013-06-10 DIAGNOSIS — K219 Gastro-esophageal reflux disease without esophagitis: Secondary | ICD-10-CM | POA: Diagnosis present

## 2013-06-10 DIAGNOSIS — T462X5A Adverse effect of other antidysrhythmic drugs, initial encounter: Secondary | ICD-10-CM | POA: Diagnosis present

## 2013-06-10 DIAGNOSIS — Z8249 Family history of ischemic heart disease and other diseases of the circulatory system: Secondary | ICD-10-CM

## 2013-06-10 DIAGNOSIS — I152 Hypertension secondary to endocrine disorders: Secondary | ICD-10-CM

## 2013-06-10 DIAGNOSIS — N189 Chronic kidney disease, unspecified: Secondary | ICD-10-CM

## 2013-06-10 DIAGNOSIS — E876 Hypokalemia: Secondary | ICD-10-CM | POA: Diagnosis not present

## 2013-06-10 DIAGNOSIS — J4489 Other specified chronic obstructive pulmonary disease: Secondary | ICD-10-CM | POA: Diagnosis present

## 2013-06-10 DIAGNOSIS — Z87891 Personal history of nicotine dependence: Secondary | ICD-10-CM

## 2013-06-10 DIAGNOSIS — J984 Other disorders of lung: Secondary | ICD-10-CM

## 2013-06-10 DIAGNOSIS — I6529 Occlusion and stenosis of unspecified carotid artery: Secondary | ICD-10-CM | POA: Diagnosis present

## 2013-06-10 DIAGNOSIS — J810 Acute pulmonary edema: Secondary | ICD-10-CM

## 2013-06-10 DIAGNOSIS — G4730 Sleep apnea, unspecified: Secondary | ICD-10-CM

## 2013-06-10 DIAGNOSIS — I129 Hypertensive chronic kidney disease with stage 1 through stage 4 chronic kidney disease, or unspecified chronic kidney disease: Secondary | ICD-10-CM | POA: Diagnosis present

## 2013-06-10 DIAGNOSIS — E1159 Type 2 diabetes mellitus with other circulatory complications: Secondary | ICD-10-CM

## 2013-06-10 DIAGNOSIS — Q964 Mosaicism, 45, X/other cell line(s) with abnormal sex chromosome: Secondary | ICD-10-CM

## 2013-06-10 DIAGNOSIS — E669 Obesity, unspecified: Secondary | ICD-10-CM

## 2013-06-10 DIAGNOSIS — I509 Heart failure, unspecified: Secondary | ICD-10-CM

## 2013-06-10 DIAGNOSIS — Z823 Family history of stroke: Secondary | ICD-10-CM

## 2013-06-10 DIAGNOSIS — N261 Atrophy of kidney (terminal): Secondary | ICD-10-CM

## 2013-06-10 DIAGNOSIS — N183 Chronic kidney disease, stage 3 unspecified: Secondary | ICD-10-CM

## 2013-06-10 DIAGNOSIS — Z79899 Other long term (current) drug therapy: Secondary | ICD-10-CM

## 2013-06-10 LAB — CBC WITH DIFFERENTIAL/PLATELET
Basophils Absolute: 0 10*3/uL (ref 0.0–0.1)
Eosinophils Absolute: 0 10*3/uL (ref 0.0–0.7)
Eosinophils Relative: 0 % (ref 0–5)
Lymphocytes Relative: 20 % (ref 12–46)
MCH: 32.2 pg (ref 26.0–34.0)
MCV: 94.9 fL (ref 78.0–100.0)
Neutrophils Relative %: 72 % (ref 43–77)
Platelets: 271 10*3/uL (ref 150–400)
RDW: 15.4 % (ref 11.5–15.5)
WBC: 11 10*3/uL — ABNORMAL HIGH (ref 4.0–10.5)

## 2013-06-10 LAB — COMPREHENSIVE METABOLIC PANEL
ALT: 13 U/L (ref 0–53)
AST: 18 U/L (ref 0–37)
CO2: 24 mEq/L (ref 19–32)
Calcium: 9.3 mg/dL (ref 8.4–10.5)
Chloride: 101 mEq/L (ref 96–112)
GFR calc non Af Amer: 26 mL/min — ABNORMAL LOW (ref >90)
Glucose, Bld: 158 mg/dL — ABNORMAL HIGH (ref 70–99)
Sodium: 140 mEq/L (ref 135–145)
Total Protein: 7.8 g/dL (ref 6.0–8.3)

## 2013-06-10 LAB — TSH: TSH: 8.873 u[IU]/mL — ABNORMAL HIGH (ref 0.350–4.500)

## 2013-06-10 LAB — PROTIME-INR: INR: 1.91 — ABNORMAL HIGH (ref 0.00–1.49)

## 2013-06-10 LAB — TROPONIN I
Troponin I: <0.3 ng/mL (ref <0.30)
Troponin I: <0.3 ng/mL (ref <0.30)

## 2013-06-10 LAB — MAGNESIUM: Magnesium: 1.7 mg/dL (ref 1.5–2.5)

## 2013-06-10 MED ORDER — RIVAROXABAN 15 MG PO TABS
15.0000 mg | ORAL_TABLET | Freq: Every day | ORAL | Status: DC
Start: 2013-06-10 — End: 2013-06-13
  Administered 2013-06-11 – 2013-06-13 (×3): 15 mg via ORAL
  Filled 2013-06-10 (×4): qty 1

## 2013-06-10 MED ORDER — ZOLPIDEM TARTRATE 5 MG PO TABS
10.0000 mg | ORAL_TABLET | Freq: Every evening | ORAL | Status: DC | PRN
  Administered 2013-06-10 – 2013-06-11 (×2): 10 mg via ORAL
  Filled 2013-06-10 (×3): qty 2

## 2013-06-10 MED ORDER — LEVALBUTEROL HCL 0.63 MG/3ML IN NEBU
0.6300 mg | INHALATION_SOLUTION | Freq: Three times a day (TID) | RESPIRATORY_TRACT | Status: DC
  Administered 2013-06-10 – 2013-06-12 (×8): 0.63 mg via RESPIRATORY_TRACT
  Filled 2013-06-10 (×14): qty 3

## 2013-06-10 MED ORDER — POTASSIUM CHLORIDE CRYS ER 20 MEQ PO TBCR
20.0000 meq | EXTENDED_RELEASE_TABLET | Freq: Once | ORAL | Status: AC
  Administered 2013-06-10: 20 meq via ORAL
  Filled 2013-06-10: qty 1

## 2013-06-10 MED ORDER — FENOFIBRATE 160 MG PO TABS
160.0000 mg | ORAL_TABLET | Freq: Every day | ORAL | Status: DC
  Administered 2013-06-11 – 2013-06-13 (×3): 160 mg via ORAL
  Filled 2013-06-10 (×4): qty 1

## 2013-06-10 MED ORDER — AMLODIPINE BESYLATE 5 MG PO TABS
5.0000 mg | ORAL_TABLET | Freq: Every day | ORAL | Status: DC
  Administered 2013-06-11 – 2013-06-13 (×3): 5 mg via ORAL
  Filled 2013-06-10 (×4): qty 1

## 2013-06-10 MED ORDER — ATORVASTATIN CALCIUM 20 MG PO TABS
20.0000 mg | ORAL_TABLET | Freq: Every day | ORAL | Status: DC
  Administered 2013-06-10 – 2013-06-12 (×3): 20 mg via ORAL
  Filled 2013-06-10 (×5): qty 1

## 2013-06-10 MED ORDER — LEVALBUTEROL TARTRATE 45 MCG/ACT IN AERO
1.0000 | INHALATION_SPRAY | RESPIRATORY_TRACT | Status: DC | PRN
  Filled 2013-06-10: qty 15

## 2013-06-10 MED ORDER — FUROSEMIDE 10 MG/ML IJ SOLN
40.0000 mg | Freq: Two times a day (BID) | INTRAMUSCULAR | Status: DC
  Administered 2013-06-10 – 2013-06-12 (×4): 40 mg via INTRAVENOUS
  Filled 2013-06-10 (×6): qty 4

## 2013-06-10 MED ORDER — SODIUM CHLORIDE 0.9 % IJ SOLN
3.0000 mL | Freq: Two times a day (BID) | INTRAMUSCULAR | Status: DC
  Administered 2013-06-10 – 2013-06-13 (×7): 3 mL via INTRAVENOUS

## 2013-06-10 MED ORDER — ASPIRIN EC 81 MG PO TBEC
81.0000 mg | DELAYED_RELEASE_TABLET | Freq: Every day | ORAL | Status: DC
  Administered 2013-06-10 – 2013-06-12 (×3): 81 mg via ORAL
  Filled 2013-06-10 (×4): qty 1

## 2013-06-10 MED ORDER — METHYLPREDNISOLONE SODIUM SUCC 125 MG IJ SOLR
60.0000 mg | Freq: Four times a day (QID) | INTRAMUSCULAR | Status: DC
  Administered 2013-06-10 – 2013-06-11 (×3): 60 mg via INTRAVENOUS
  Filled 2013-06-10 (×8): qty 0.96
  Filled 2013-06-10: qty 2
  Filled 2013-06-10: qty 0.96

## 2013-06-10 MED ORDER — PERFLUTREN LIPID MICROSPHERE
1.0000 mL | INTRAVENOUS | Status: AC | PRN
  Administered 2013-06-10: 15:00:00 5 mL via INTRAVENOUS
  Filled 2013-06-10: qty 10

## 2013-06-10 MED ORDER — BUSPIRONE HCL 15 MG PO TABS
15.0000 mg | ORAL_TABLET | Freq: Two times a day (BID) | ORAL | Status: DC
Start: 2013-06-10 — End: 2013-06-13
  Administered 2013-06-10 – 2013-06-13 (×7): 15 mg via ORAL
  Filled 2013-06-10 (×9): qty 1

## 2013-06-10 MED ORDER — SODIUM CHLORIDE 0.9 % IJ SOLN: 3.0000 mL | INTRAMUSCULAR | Status: DC | PRN

## 2013-06-10 MED ORDER — METOPROLOL TARTRATE 25 MG PO TABS
25.0000 mg | ORAL_TABLET | Freq: Two times a day (BID) | ORAL | Status: DC
  Administered 2013-06-10 – 2013-06-13 (×6): 25 mg via ORAL
  Filled 2013-06-10 (×10): qty 1

## 2013-06-10 MED ORDER — LISINOPRIL 2.5 MG PO TABS
2.5000 mg | ORAL_TABLET | Freq: Every day | ORAL | Status: DC
  Administered 2013-06-11: 12:00:00 2.5 mg via ORAL
  Filled 2013-06-10 (×2): qty 1

## 2013-06-10 MED ORDER — NIACIN ER 500 MG PO CPCR
500.0000 mg | ORAL_CAPSULE | Freq: Every day | ORAL | Status: DC
  Administered 2013-06-10 – 2013-06-12 (×3): 500 mg via ORAL
  Filled 2013-06-10 (×6): qty 1

## 2013-06-10 MED ORDER — VITAMIN D3 25 MCG (1000 UNIT) PO TABS
1000.0000 [IU] | ORAL_TABLET | Freq: Every day | ORAL | Status: DC
  Administered 2013-06-11 – 2013-06-13 (×3): 1000 [IU] via ORAL
  Filled 2013-06-10 (×4): qty 1

## 2013-06-10 MED ORDER — ALLOPURINOL 300 MG PO TABS
300.0000 mg | ORAL_TABLET | Freq: Every day | ORAL | Status: DC
  Administered 2013-06-11 – 2013-06-13 (×3): 300 mg via ORAL
  Filled 2013-06-10 (×5): qty 1

## 2013-06-10 MED ORDER — POTASSIUM CHLORIDE CRYS ER 20 MEQ PO TBCR
40.0000 meq | EXTENDED_RELEASE_TABLET | Freq: Two times a day (BID) | ORAL | Status: DC
  Administered 2013-06-10 – 2013-06-13 (×6): 40 meq via ORAL
  Filled 2013-06-10 (×9): qty 2

## 2013-06-10 MED ORDER — NIACIN-SIMVASTATIN ER 1000-40 MG PO TB24: 1000.0000 mg | ORAL_TABLET | Freq: Every day | ORAL | Status: DC

## 2013-06-10 MED ORDER — SIMVASTATIN 40 MG PO TABS
40.0000 mg | ORAL_TABLET | Freq: Every day | ORAL | Status: DC
  Filled 2013-06-10: qty 1

## 2013-06-10 MED ORDER — SODIUM CHLORIDE 0.9 % IV SOLN: 250.0000 mL | INTRAVENOUS | Status: DC | PRN

## 2013-06-10 MED ORDER — ONDANSETRON HCL 4 MG/2ML IJ SOLN: 4.0000 mg | Freq: Four times a day (QID) | INTRAMUSCULAR | Status: DC | PRN

## 2013-06-10 MED ORDER — ASPIRIN EC 81 MG PO TBEC
81.0000 mg | DELAYED_RELEASE_TABLET | Freq: Every day | ORAL | Status: DC
  Administered 2013-06-11 – 2013-06-13 (×3): 81 mg via ORAL
  Filled 2013-06-10 (×5): qty 1

## 2013-06-10 MED ORDER — OMEGA-3-ACID ETHYL ESTERS 1 G PO CAPS
2.0000 g | ORAL_CAPSULE | Freq: Two times a day (BID) | ORAL | Status: DC
  Administered 2013-06-10 – 2013-06-13 (×6): 2 g via ORAL
  Filled 2013-06-10 (×9): qty 2

## 2013-06-10 MED ORDER — PANTOPRAZOLE SODIUM 40 MG PO TBEC
40.0000 mg | DELAYED_RELEASE_TABLET | Freq: Every day | ORAL | Status: DC
  Administered 2013-06-10 – 2013-06-13 (×4): 40 mg via ORAL
  Filled 2013-06-10 (×4): qty 1

## 2013-06-10 MED ORDER — ACETAMINOPHEN 325 MG PO TABS: 650.0000 mg | ORAL_TABLET | ORAL | Status: DC | PRN

## 2013-06-10 MED ORDER — CITALOPRAM HYDROBROMIDE 20 MG PO TABS
20.0000 mg | ORAL_TABLET | Freq: Every day | ORAL | Status: DC
  Administered 2013-06-11 – 2013-06-13 (×3): 20 mg via ORAL
  Filled 2013-06-10 (×5): qty 1

## 2013-06-10 NOTE — Consult Note (Addendum)
PULMONARY  / CRITICAL CARE MEDICINE  Name: [name] [name] MRN: [id number] DOB: Sep 02, 1955    ADMISSION DATE:  06/10/2013 CONSULTATION DATE:  06/10/2013  REFERRING MD :  Dr. Rennis Golden PRIMARY SERVICE:  Cardiology  CHIEF COMPLAINT:  SOB  BRIEF PATIENT DESCRIPTION: 57 year old male with PMH of COPD (unknown stage) who presents to the hospital with SOB.  Patient was started on amiodarone for rate control 2 weeks PTP during which time he was having no symptoms.  SOB started 3 days earlier and has been progressively getting worse.  Denied fever, chills, N/V, exposure, recent travel, sick contact, cough or sputum production but does relay that his sats drops in the mid 80s with activity on his home pulse ox.  SIGNIFICANT EVENTS / STUDIES:  Chest CT 12/8>>>  LINES / TUBES: PIV  CULTURES: None  ANTIBIOTICS: None  PAST MEDICAL HISTORY :  Past Medical History  Diagnosis Date  . Hypertension   . Obesity     with a BMI of 35  . Gout   . GERD (gastroesophageal reflux disease)   . Depression   . ASHD (arteriosclerotic heart disease)     hx CABG 2005, patent grafts 04/2013  . Diabetes mellitus   . Hyperlipidemia   . Hypertriglyceridemia     TRIGS OVER 3000 IN THE PAST; MOST RECENT TRIGS OF 306, HDL 36, VLDL 61, LDL 89  . 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 History of tobacco abuse     PT QUIT 07/04/12  . OSA on CPAP   . Shortness of breath   . Nonischemic cardiomyopathy, ef 30-40% by echo 03/2013 down from 49%, was in atrial fib  at time of Echo 05/01/2013  . CKD (chronic kidney disease) stage 3, GFR 30-59 ml/min, recently was CKD stage 4 with diuressing. 05/01/2013  . Carotid disease, bilateral   . Persistent atrial fibrillation 04/18/2013    DCCV 11/2012; 04/2013  . Chronic anticoagulation 04/18/2013    xarelto   Past Surgical History  Procedure Laterality Date  . Appendectomy  1983  . Cardioversion N/A 11/14/2012    Procedure: CARDIOVERSION;  Surgeon:  [name], MD;  Location: [facility] ENDOSCOPY;  Service: Cardiovascular;  Laterality: N/A;  . Coronary artery bypass graft  05/11/04    X 4; HAS A VERY SHORT AORTA, ROUND DILATED HEART AS WELL AS A STERNAL ABNORMALITY   . Cardiac catheterization  11/18/10    GRAFTS WIDELY PATENT WITH NO HIGH-GRADE CAD  . Carotids  04/03/12    CAROTID DUPLEX; RIGHT BULB/PROXIMAL ICA: MILD AMT OF FIBROUS PLAQUE SLIGHTLY ELEVATING VELOCITIES WITHIN THE PROXIMAL SEGMENT OF THE INTERNAL CAROTID  ARTERY  . Nm myoview ltd  09/17/09    EF 49%; GLOBAL LV SYSTOLIC FUNCTION MILDLY REDUCED  . Cardiac catheterization  04/2013    patent grafts, also RHC done   Prior to Admission medications   Medication Sig Start Date End Date Taking? Authorizing Provider  acetaminophen (TYLENOL) 500 MG tablet Take 1,000 mg by mouth every 6 (six) hours as needed for pain.    Historical Provider, MD  allopurinol (ZYLOPRIM) 300 MG tablet take 1 tablet by mouth once daily 09/22/12   Ronnald Nian, MD  amLODipine (NORVASC) 5 MG tablet take 1 tablet by mouth once daily 09/14/12   Ronnald Nian, MD  aspirin EC 81 MG tablet Take 81 mg by mouth at bedtime.    Historical Provider, MD  busPIRone (BUSPAR) 15 MG  tablet take 1 tablet by mouth twice a day AT 10AM AND 5PM. 12/22/12   Ronnald Nian, MD  cholecalciferol (VITAMIN D) 1000 UNITS tablet Take 1,000 Units by mouth daily.    Historical Provider, MD  Choline Fenofibrate (TRILIPIX) 135 MG capsule Take 135 mg by mouth daily.    Historical Provider, MD  citalopram (CELEXA) 20 MG tablet take 1 tablet by mouth once daily 09/22/12   Ronnald Nian, MD  FORTESTA 10 MG/ACT (2%) GEL apply 4 PUMPS once daily to SKIN 03/16/13   Ronnald Nian, MD  furosemide (LASIX) 40 MG tablet Take 1.5 tablets (60 mg total) by mouth daily. 05/27/13   [name], MD  glucose blood test strip Use as instructed(THIS IS FOR THE Letta Pate VERIO IQ ) 03/13/13   Ronnald Nian, MD  Lansoprazole (PREVACID PO) Take 1 capsule by  mouth at bedtime.    Historical Provider, MD  levalbuterol [facility] HFA) 45 MCG/ACT inhaler Inhale 1-2 puffs into the lungs every 4 (four) hours as needed for wheezing. 10/15/12   Ronnald Nian, MD  metoprolol tartrate (LOPRESSOR) 25 MG tablet Take 1 tablet (25 mg total) by mouth 2 (two) times daily. 05/27/13   [name], MD  omega-3 acid ethyl esters (LOVAZA) 1 G capsule Take 2 g by mouth 2 (two) times daily.    Historical Provider, MD  [facility] DELICA LANCETS FINE MISC 1 each by Does not apply route 2 (two) times daily. 03/13/13   Ronnald Nian, MD  Rivaroxaban (XARELTO) 15 MG TABS tablet Take 15 mg by mouth daily.    Historical Provider, MD  [facility] 1000-40 MG TB24 take 1 tablet by mouth once daily 09/22/12   Ronnald Nian, MD   No Known Allergies  FAMILY HISTORY:  Family History  Problem Relation Age of Onset  . Heart disease Father   . Cancer Father 7    LUNG  . Hypertension Father   . Diabetes Brother   . Stroke Maternal Grandmother 60  . Heart disease Paternal Grandmother    SOCIAL HISTORY:  reports that he quit smoking about 11 months ago. His smoking use included Cigarettes. He smoked 1.00 pack per day. He has never used smokeless tobacco. He reports that he does not drink alcohol or use illicit drugs.  REVIEW OF SYSTEMS:   Constitutional: Negative for fever, chills, weight loss, malaise/fatigue and diaphoresis.  HENT: Negative for hearing loss, ear pain, nosebleeds, congestion, sore throat, neck pain, tinnitus and ear discharge.   Eyes: Negative for blurred vision, double vision, photophobia, pain, discharge and redness.  Respiratory: Negative for cough, hemoptysis, sputum production, shortness of breath, wheezing and stridor.   Cardiovascular: Negative for chest pain, palpitations, orthopnea, claudication, leg swelling and PND.  Gastrointestinal: Negative for heartburn, nausea, vomiting, abdominal pain, diarrhea, constipation, blood in stool and melena.   Genitourinary: Negative for dysuria, urgency, frequency, hematuria and flank pain.  Musculoskeletal: Negative for myalgias, back pain, joint pain and falls.  Skin: Negative for itching and rash.  Neurological: Negative for dizziness, tingling, tremors, sensory change, speech change, focal weakness, seizures, loss of consciousness, weakness and headaches.  Endo/Heme/Allergies: Negative for environmental allergies and polydipsia. Does not bruise/bleed easily.  SUBJECTIVE:   VITAL SIGNS: Temp:  [97.3 F (36.3 C)] 97.3 F (36.3 C) (12/08 1100) Pulse Rate:  [77-85] 85 (12/08 1100) Resp:  [18] 18 (12/08 1100) BP: (133-144)/(52-60) 133/52 mmHg (12/08 1100) SpO2:  [91 %] 91 % (12/08 1100) Weight:  [109.1  kg (240 lb 8.4 oz)-109.861 kg (242 lb 3.2 oz)] 109.1 kg (240 lb 8.4 oz) (12/08 1100)  PHYSICAL EXAMINATION: General:  Obese male in moderate respiratory distress on O2. Neuro:  Alert and interactive, NAD. HEENT:  [place]/AT, PERRL, EOM-I and MMM. Neck:  Supple, -LAN and -thryomegally. Cardiovascular:  RRR, Nl S1/S2, -M/R/G. Lungs:  Diffuse crackles in all lung fields. Abdomen:  Soft, NT, ND and +BS. Musculoskeletal:  -edema and -tenderness. Skin:  Intact.  No results found for this basename: NA, K, CL, CO2, BUN, CREATININE, GLUCOSE,  in the last 168 hours No results found for this basename: HGB, HCT, WBC, PLT,  in the last 168 hours No results found.  ASSESSMENT / PLAN:  57 year old male started on amiodarone 2 wks prior to presentation.  Presenting with SOB and hypoxemia.  DDx include amiodarone toxicity or heart failure.  Difficult to ascertain without imaging.  Also OSA history on CPAP.  Plan: - D/C amiodarone. - No bronch for now. - Start steroids for three days. - PFTs done and reviewed. - Titrate O2. - Respiratory viral panel. - Chest CT with HR cuts as ordered. - Albuterol PRN. - No need for biopsy/bronch for now, if not improving then will reconsider. - CPAP with O2.  Will  need to determine if patient's machine is able to bleed O2 in or not prior to discharge. - Will need an ambulatory desat study prior to discharge to determine if O2 will be needed at home. - Keep close to dry weight.  Alyson Reedy, M.D. Pulmonary and Critical Care Medicine [facility] Pager: [phone]  06/10/2013, 1:49 PM

## 2013-06-10 NOTE — H&P (Signed)
06/10/2013   PCP: Carollee Herter, MD   Chief Complaint   Patient presents with   .  Atrial Fibrillation     and SOB since Friday, reports decreasing O2 sats (85%); lightheadedness/dizziness    Primary Cardiologist: Dr. Rennis Golden   HPI: 57 year old WMM with a past medical history significant for coronary artery disease and bypass surgery x 3 vessels in 2005 (LIMA to LAD, SVG to DIAG, and SVG to OM1/OM2). He also has hyperlipidemia and hypertriglyceridemia with triglycerides of over 3000 in the past, morbid obesity, OSA on CPAP and recently atrial fibrillation. Most recent labs showed triglycerides of 306, HDL of 36, VLDL 61 and LDL of 89. He had an outpatient cardiac catheterization on Nov 18, 2010 that showed the grafts were widely patent. Because of a solitary kidney he did not have an LVgram. His last creatinine October 11, 2012 was 1.58. The BUN was 31. The patient was seen on October 10, 2012 and was found to be in atrial fibrillation. He was scheduled for repeat cardioversion and had been placed on Xarelto at that time. Dr. Rennis Golden did not feel it was safe without general anesthesia to do the TEE due to his history of obstructive sleep apnea and relatively thick neck. The patient also had interrupted his Xarelto for 1 day due to gingival bleeding. He did undergo successful cardioversion to sinus rhythm after several weeks of anticoagulation and reported that he felt better. Subsequently he had an echocardiogram which showed a newly reduced ejection fraction of 35%. This is in the setting of worsening shortness of breath. Dr. Rennis Golden recently started him on low-dose Lasix however his creatinine was rising above 3. He has never seen a nephrologist. As above, he is known to have a solitary kidney. 10/16 he returned for follow up and reported markedly increased shortness of breath without much improvement with diuresis. It is noted that he is in atrial fibrillation with rapid ventricular response. I suspect  either this or coronary bypass graft dysfunction may be contributing to his heart failure. He was noted to be in atrial fib on echo in Sept 2014. EF 30-40%, mild to mod.MR, OA ok pressure 41 mmHg. Pt admitted to [facility]. RHC was completed but LHC held due to rising Cr. Lexiscan myoview completed. Also DCCV to SR. With abnormal myoview pt underwent LHC Finding patent grafts. LVEDP 29 mmHg. Renal also followed.  He was recently seen in the office with recurrent atrial fibrillation and rapid ventricular response. I increased his metoprolol to 25 mg twice daily and referred him to Dr. Johney Frame for evaluation of possible antiarrhythmic therapy versus atrial fibrillation ablation. Mr. Lerette had limited options for antiarrhythmic therapy, but he had not failed treatment to this point. Dr. Johney Frame recommended starting amiodarone and he has been taking 40 mg twice daily for a loading dose. He reports over the past several days that he has had marked increase in shortness of breath and in fact over the weekend was not able to do much in the way of activity and essentially slept all day. He has a pulse oximeter at home which are showing rest oxygen saturations in the mid-80s. He has not had significant weight gain and, in fact his weight is actually 3 pounds lighter than his last office visit. His EKG today does show sinus rhythm at 77 with a left bundle branch block and PVCs. He was able to walk 2 laps around the office but then was completely exhausted with oxygen saturations in the  low to mid 80s. He was placed on oxygen in the office with a quick improvement in his symptoms.   Allergies:  No Known Allergies    Current Outpatient Prescriptions   Medication  Sig  Dispense  Refill   .  acetaminophen (TYLENOL) 500 MG tablet  Take 1,000 mg by mouth every 6 (six) hours as needed for pain.     Marland Kitchen  allopurinol (ZYLOPRIM) 300 MG tablet  take 1 tablet by mouth once daily  30 tablet  12   .  amLODipine (NORVASC) 5 MG tablet   take 1 tablet by mouth once daily  30 tablet  11   .  aspirin EC 81 MG tablet  Take 81 mg by mouth at bedtime.     .  busPIRone (BUSPAR) 15 MG tablet  take 1 tablet by mouth twice a day AT 10AM AND 5PM.  60 tablet  5   .  cholecalciferol (VITAMIN D) 1000 UNITS tablet  Take 1,000 Units by mouth daily.     .  Choline Fenofibrate (TRILIPIX) 135 MG capsule  Take 135 mg by mouth daily.     .  citalopram (CELEXA) 20 MG tablet  take 1 tablet by mouth once daily  30 tablet  11   .  FORTESTA 10 MG/ACT (2%) GEL  apply 4 PUMPS once daily to SKIN  60 g  5   .  furosemide (LASIX) 40 MG tablet  Take 1.5 tablets (60 mg total) by mouth daily.  45 tablet  6   .  glucose blood test strip  Use as instructed(THIS IS FOR THE ONETOUCH VERIO IQ )  100 each  PRN   .  Lansoprazole (PREVACID PO)  Take 1 capsule by mouth at bedtime.     .  levalbuterol (XOPENEX HFA) 45 MCG/ACT inhaler  Inhale 1-2 puffs into the lungs every 4 (four) hours as needed for wheezing.  1 Inhaler  12   .  metoprolol tartrate (LOPRESSOR) 25 MG tablet  Take 1 tablet (25 mg total) by mouth 2 (two) times daily.  60 tablet  6   .  omega-3 acid ethyl esters (LOVAZA) 1 G capsule  Take 2 g by mouth 2 (two) times daily.     Letta Pate DELICA LANCETS FINE MISC  1 each by Does not apply route 2 (two) times daily.  100 each  PRN   .  Rivaroxaban (XARELTO) 15 MG TABS tablet  Take 15 mg by mouth daily.     Marland Kitchen  SIMCOR 1000-40 MG TB24  take 1 tablet by mouth once daily  30 tablet  11    No current facility-administered medications for this visit.    Past Medical History   Diagnosis  Date   .  Hypertension    .  Obesity      with a BMI of 35   .  Gout    .  GERD (gastroesophageal reflux disease)    .  Depression    .  ASHD (arteriosclerotic heart disease)      hx CABG 2005, patent grafts 04/2013   .  Diabetes mellitus    .  Hyperlipidemia    .  Hypertriglyceridemia      TRIGS OVER 3000 IN THE PAST; MOST RECENT TRIGS OF 306, HDL 36, VLDL 61, LDL 89   .   Chronic kidney disease      CHRONIC WITH A SOLITARY KIDNEY   .  History of tobacco abuse  PT QUIT 07/04/12   .  OSA on CPAP    .  Shortness of breath    .  Nonischemic cardiomyopathy, ef 30-40% by echo 03/2013 down from 49%, was in atrial fib at time of Echo  05/01/2013   .  CKD (chronic kidney disease) stage 3, GFR 30-59 ml/min, recently was CKD stage 4 with diuressing.  05/01/2013   .  Carotid disease, bilateral    .  Persistent atrial fibrillation  04/18/2013     DCCV 11/2012; 04/2013   .  Chronic anticoagulation  04/18/2013     xarelto    Past Surgical History   Procedure  Laterality  Date   .  Appendectomy   1983   .  Cardioversion  N/A  11/14/2012     Procedure: CARDIOVERSION; Surgeon: [name], MD; Location: [facility] ENDOSCOPY; Service: Cardiovascular; Laterality: N/A;   .  Coronary artery bypass graft   05/11/04     X 4; HAS A VERY SHORT AORTA, ROUND DILATED HEART AS WELL AS A STERNAL ABNORMALITY   .  Cardiac catheterization   11/18/10     GRAFTS WIDELY PATENT WITH NO HIGH-GRADE CAD   .  Carotids   04/03/12     CAROTID DUPLEX; RIGHT BULB/PROXIMAL ICA: MILD AMT OF FIBROUS PLAQUE SLIGHTLY ELEVATING VELOCITIES WITHIN THE PROXIMAL SEGMENT OF THE INTERNAL CAROTID ARTERY   .  Nm myoview ltd   09/17/09     EF 49%; GLOBAL LV SYSTOLIC FUNCTION MILDLY REDUCED   .  Cardiac catheterization   04/2013     patent grafts, also RHC done    Family History   Problem  Relation  Age of Onset   .  Heart disease  Father    .  Cancer  Father  110     LUNG   .  Hypertension  Father    .  Diabetes  Brother    .  Stroke  Maternal Grandmother  60   .  Heart disease  Paternal Grandmother     ROS:  General:no colds or fevers, weight gain  Skin:no rashes or ulcers  HEENT:no blurred vision, no congestion CV: normal rhythm, palpitations PUL: progressive dyspnea GI:no diarrhea constipation or melena, no indigestion GU:no hematuria, no dysuria MS:no joint pain, no claudication Neuro:no syncope, no  lightheadedness  Endo:+ diabetes with stable glucose, no thyroid disease   PHYSICAL EXAM  BP 144/60  Pulse 77  Ht 5\' 8"  (1.727 m)  Wt 242 lb 3.2 oz (109.861 kg)  BMI 36.83 kg/m2, SPO2- 85%  General: Appears in mild respiratory distress  Skin: Pale, warm, dry  HEENT:normocephalic, sclera clear, mucus membranes moist  Neck:Thick,supple, no JVD, + carotid bruits  Heart:S1S2 RRR without murmur, gallup, rub or click  Lungs: bibasilar crackles, decreased airflow  ZOX:WRUEA, soft, non tender, + BS, do not palpate liver spleen or masses  Ext:no lower ext edema, 2+ pedal pulses, 2+ radial pulses  Neuro:alert and oriented, MAE, follows commands, + facial symmetry   EKG:  Sinus rhythm at 77, left bundle branch block, PVCs   ASSESSMENT AND PLAN  Patient Active Problem List    Diagnosis  Date Noted   .  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  Nonischemic cardiomyopathy, ef 30-40% by echo 03/2013 down from 49%, was in atrial fib at time of Echo  05/01/2013   .  CKD (chronic kidney disease) stage 3, GFR 30-59 ml/min, recently was CKD stage 4 with diuressing.  05/01/2013   .  Carotid disease, bilateral    .  Acute on chronic renal insufficiency  04/21/2013   .  Atrial fibrillation with RVR- DCCV 04/19/13 to NSR  04/18/2013   .  Chronic anticoagulation  04/18/2013   .  Acute systolic congestive heart failure, NYHA class 4  04/18/2013   .  S/P CABG x 3 2005. Patent grafts 04/2013  04/18/2013   .  Obesity: BMI 36.8  11/27/2012   .  Persistent atrial fibrillation  10/15/2012   .  Type II or unspecified type diabetes mellitus without mention of complication, not stated as uncontrolled  07/07/2011   .  Hypogonadism male  06/23/2011   .  Hypertension associated with diabetes  11/25/2010   .  Hyperlipidemia LDL goal <70  11/25/2010   .  Sleep apnea- on C-pap  11/25/2010   .  Depression  11/25/2010   .  Atrophy of left kidney, solitary kidney  11/25/2010   .  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 Mr. Salinger has had progressive shortness of breath over the past several days without any significant weight gain, lower extremity edema or other signs of heart failure. His weight is a few pounds less than his last office visit. There is some evidence for pulmonary edema with bibasilar crackles which are new. His oxygen levels are markedly reduced both at rest and with minimal exertion. I suspect he may have acute pulmonary toxicity from amiodarone. Based on his significant breathlessness, would recommend admission for diuresis and discontinuing his amiodarone. He may benefit from a pulmonary consult. I do feel that pulmonary embolus is an unlikely cause of his shortness of breath, he denies any chest pain or pleuritic symptoms, as well as the fact that he is on anti-coagulation. If he indeed does improve off of amiodarone and fails to maintain sinus rhythm, he may again need to be evaluated for ablation.  We'll arrange for a direct admission to [facility].   [name], MD, [facility]  Attending Cardiologist  [facility]

## 2013-06-10 NOTE — Progress Notes (Addendum)
  Echocardiogram 2D Echocardiogram with Definity has been performed.  Cathie Beams 06/10/2013, 3:52 PM

## 2013-06-10 NOTE — Progress Notes (Signed)
RT placed patient on cpap 16cmH20 via medium full face mask with 4 lpm 02 bleed in patient is tolerating cpap well at this time.

## 2013-06-10 NOTE — Progress Notes (Signed)
Pt arrived from office, obviously SOB, mild crackles in bases.   Pt states his stools do smell different but has not seen any obvious blood. Will order neb but no wheezes noted.  Will notify CCM also.

## 2013-06-10 NOTE — Progress Notes (Signed)
Pt. Admitted by Dr. Rennis Golden. See his H&P  Marykay Lex, MD

## 2013-06-10 NOTE — Progress Notes (Signed)
06/10/2013   PCP: LALONDE,JOHN CHARLES, MD   Chief Complaint   Patient presents with   .  Atrial Fibrillation     and SOB since Friday, reports decreasing O2 sats (85%); lightheadedness/dizziness    Primary Cardiologist: Dr. Hilty   HPI: 57 year old WMM with a past medical history significant for coronary artery disease and bypass surgery x 3 vessels in 2005 (LIMA to LAD, SVG to DIAG, and SVG to OM1/OM2). He also has hyperlipidemia and hypertriglyceridemia with triglycerides of over 3000 in the past, morbid obesity, OSA on CPAP and recently atrial fibrillation. Most recent labs showed triglycerides of 306, HDL of 36, VLDL 61 and LDL of 89. He had an outpatient cardiac catheterization on Nov 18, 2010 that showed the grafts were widely patent. Because of a solitary kidney he did not have an LVgram. His last creatinine October 11, 2012 was 1.58. The BUN was 31. The patient was seen on October 10, 2012 and was found to be in atrial fibrillation. He was scheduled for repeat cardioversion and had been placed on Xarelto at that time. Dr. Hilty did not feel it was safe without general anesthesia to do the TEE due to his history of obstructive sleep apnea and relatively thick neck. The patient also had interrupted his Xarelto for 1 day due to gingival bleeding. He did undergo successful cardioversion to sinus rhythm after several weeks of anticoagulation and reported that he felt better. Subsequently he had an echocardiogram which showed a newly reduced ejection fraction of 35%. This is in the setting of worsening shortness of breath. Dr. Hilty recently started him on low-dose Lasix however his creatinine was rising above 3. He has never seen a nephrologist. As above, he is known to have a solitary kidney. 10/16 he returned for follow up and reported markedly increased shortness of breath without much improvement with diuresis. It is noted that he is in atrial fibrillation with rapid ventricular response. I suspect  either this or coronary bypass graft dysfunction may be contributing to his heart failure. He was noted to be in atrial fib on echo in Sept 2014. EF 30-40%, mild to mod.MR, OA ok pressure 41 mmHg. Pt admitted to [facility]. RHC was completed but LHC held due to rising Cr. Lexiscan myoview completed. Also DCCV to SR. With abnormal myoview pt underwent LHC Finding patent grafts. LVEDP 29 mmHg. Renal also followed.  He was recently seen in the office with recurrent atrial fibrillation and rapid ventricular response. I increased his metoprolol to 25 mg twice daily and referred him to Dr. Allred for evaluation of possible antiarrhythmic therapy versus atrial fibrillation ablation. Mr. Man had limited options for antiarrhythmic therapy, but he had not failed treatment to this point. Dr. Allred recommended starting amiodarone and he has been taking 40 mg twice daily for a loading dose. He reports over the past several days that he has had marked increase in shortness of breath and in fact over the weekend was not able to do much in the way of activity and essentially slept all day. He has a pulse oximeter at home which are showing rest oxygen saturations in the mid-80s. He has not had significant weight gain and, in fact his weight is actually 3 pounds lighter than his last office visit. His EKG today does show sinus rhythm at 77 with a left bundle branch block and PVCs. He was able to walk 2 laps around the office but then was completely exhausted with oxygen saturations in the   low to mid 80s. He was placed on oxygen in the office with a quick improvement in his symptoms.   Allergies:  No Known Allergies    Current Outpatient Prescriptions   Medication  Sig  Dispense  Refill   .  acetaminophen (TYLENOL) 500 MG tablet  Take 1,000 mg by mouth every 6 (six) hours as needed for pain.     .  allopurinol (ZYLOPRIM) 300 MG tablet  take 1 tablet by mouth once daily  30 tablet  12   .  amLODipine (NORVASC) 5 MG tablet   take 1 tablet by mouth once daily  30 tablet  11   .  aspirin EC 81 MG tablet  Take 81 mg by mouth at bedtime.     .  busPIRone (BUSPAR) 15 MG tablet  take 1 tablet by mouth twice a day AT 10AM AND 5PM.  60 tablet  5   .  cholecalciferol (VITAMIN D) 1000 UNITS tablet  Take 1,000 Units by mouth daily.     .  Choline Fenofibrate (TRILIPIX) 135 MG capsule  Take 135 mg by mouth daily.     .  citalopram (CELEXA) 20 MG tablet  take 1 tablet by mouth once daily  30 tablet  11   .  FORTESTA 10 MG/ACT (2%) GEL  apply 4 PUMPS once daily to SKIN  60 g  5   .  furosemide (LASIX) 40 MG tablet  Take 1.5 tablets (60 mg total) by mouth daily.  45 tablet  6   .  glucose blood test strip  Use as instructed(THIS IS FOR THE ONETOUCH VERIO IQ )  100 each  PRN   .  Lansoprazole (PREVACID PO)  Take 1 capsule by mouth at bedtime.     .  levalbuterol (XOPENEX HFA) 45 MCG/ACT inhaler  Inhale 1-2 puffs into the lungs every 4 (four) hours as needed for wheezing.  1 Inhaler  12   .  metoprolol tartrate (LOPRESSOR) 25 MG tablet  Take 1 tablet (25 mg total) by mouth 2 (two) times daily.  60 tablet  6   .  omega-3 acid ethyl esters (LOVAZA) 1 G capsule  Take 2 g by mouth 2 (two) times daily.     .  ONETOUCH DELICA LANCETS FINE MISC  1 each by Does not apply route 2 (two) times daily.  100 each  PRN   .  Rivaroxaban (XARELTO) 15 MG TABS tablet  Take 15 mg by mouth daily.     .  SIMCOR 1000-40 MG TB24  take 1 tablet by mouth once daily  30 tablet  11    No current facility-administered medications for this visit.    Past Medical History   Diagnosis  Date   .  Hypertension    .  Obesity      with a BMI of 35   .  Gout    .  GERD (gastroesophageal reflux disease)    .  Depression    .  ASHD (arteriosclerotic heart disease)      hx CABG 2005, patent grafts 04/2013   .  Diabetes mellitus    .  Hyperlipidemia    .  Hypertriglyceridemia      TRIGS OVER 3000 IN THE PAST; MOST RECENT TRIGS OF 306, HDL 36, VLDL 61, LDL 89   .   Chronic kidney disease      CHRONIC WITH A SOLITARY KIDNEY   .  History of tobacco abuse        PT QUIT 07/04/12   .  OSA on CPAP    .  Shortness of breath    .  Nonischemic cardiomyopathy, ef 30-40% by echo 03/2013 down from 49%, was in atrial fib at time of Echo  05/01/2013   .  CKD (chronic kidney disease) stage 3, GFR 30-59 ml/min, recently was CKD stage 4 with diuressing.  05/01/2013   .  Carotid disease, bilateral    .  Persistent atrial fibrillation  04/18/2013     DCCV 11/2012; 04/2013   .  Chronic anticoagulation  04/18/2013     xarelto    Past Surgical History   Procedure  Laterality  Date   .  Appendectomy   1983   .  Cardioversion  N/A  11/14/2012     Procedure: CARDIOVERSION; Surgeon: Kenneth C. Hilty, MD; Location: [facility] ENDOSCOPY; Service: Cardiovascular; Laterality: N/A;   .  Coronary artery bypass graft   05/11/04     X 4; HAS A VERY SHORT AORTA, ROUND DILATED HEART AS WELL AS A STERNAL ABNORMALITY   .  Cardiac catheterization   11/18/10     GRAFTS WIDELY PATENT WITH NO HIGH-GRADE CAD   .  Carotids   04/03/12     CAROTID DUPLEX; RIGHT BULB/PROXIMAL ICA: MILD AMT OF FIBROUS PLAQUE SLIGHTLY ELEVATING VELOCITIES WITHIN THE PROXIMAL SEGMENT OF THE INTERNAL CAROTID ARTERY   .  Nm myoview ltd   09/17/09     EF 49%; GLOBAL LV SYSTOLIC FUNCTION MILDLY REDUCED   .  Cardiac catheterization   04/2013     patent grafts, also RHC done    Family History   Problem  Relation  Age of Onset   .  Heart disease  Father    .  Cancer  Father  69     LUNG   .  Hypertension  Father    .  Diabetes  Brother    .  Stroke  Maternal Grandmother  60   .  Heart disease  Paternal Grandmother     ROS:  General:no colds or fevers, weight gain  Skin:no rashes or ulcers  HEENT:no blurred vision, no congestion CV: normal rhythm, palpitations PUL: progressive dyspnea GI:no diarrhea constipation or melena, no indigestion GU:no hematuria, no dysuria MS:no joint pain, no claudication Neuro:no syncope, no  lightheadedness  Endo:+ diabetes with stable glucose, no thyroid disease   PHYSICAL EXAM  BP 144/60  Pulse 77  Ht 5' 8" (1.727 m)  Wt 242 lb 3.2 oz (109.861 kg)  BMI 36.83 kg/m2, SPO2- 85%  General: Appears in mild respiratory distress  Skin: Pale, warm, dry  HEENT:normocephalic, sclera clear, mucus membranes moist  Neck:Thick,supple, no JVD, + carotid bruits  Heart:S1S2 RRR without murmur, gallup, rub or click  Lungs: bibasilar crackles, decreased airflow  Abd:obese, soft, non tender, + BS, do not palpate liver spleen or masses  Ext:no lower ext edema, 2+ pedal pulses, 2+ radial pulses  Neuro:alert and oriented, MAE, follows commands, + facial symmetry   EKG:  Sinus rhythm at 77, left bundle branch block, PVCs   ASSESSMENT AND PLAN  Patient Active Problem List    Diagnosis  Date Noted   .  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  Nonischemic cardiomyopathy, ef 30-40% by echo 03/2013 down from 49%, was in atrial fib at time of Echo  05/01/2013   .  CKD (chronic kidney disease) stage 3, GFR 30-59 ml/min, recently was CKD stage 4 with diuressing.  05/01/2013   .    Carotid disease, bilateral    .  Acute on chronic renal insufficiency  04/21/2013   .  Atrial fibrillation with RVR- DCCV 04/19/13 to NSR  04/18/2013   .  Chronic anticoagulation  04/18/2013   .  Acute systolic congestive heart failure, NYHA class 4  04/18/2013   .  S/P CABG x 3 2005. Patent grafts 04/2013  04/18/2013   .  Obesity: BMI 36.8  11/27/2012   .  Persistent atrial fibrillation  10/15/2012   .  Type II or unspecified type diabetes mellitus without mention of complication, not stated as uncontrolled  07/07/2011   .  Hypogonadism male  06/23/2011   .  Hypertension associated with diabetes  11/25/2010   .  Hyperlipidemia LDL goal <70  11/25/2010   .  Sleep apnea- on C-pap  11/25/2010   .  Depression  11/25/2010   .  Atrophy of left kidney, solitary kidney  11/25/2010   .  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 Mr. Petterson has had progressive shortness of breath over the past several days without any significant weight gain, lower extremity edema or other signs of heart failure. His weight is a few pounds less than his last office visit. There is some evidence for pulmonary edema with bibasilar crackles which are new. His oxygen levels are markedly reduced both at rest and with minimal exertion. I suspect he may have acute pulmonary toxicity from amiodarone. Based on his significant breathlessness, would recommend admission for diuresis and discontinuing his amiodarone. He may benefit from a pulmonary consult. I do feel that pulmonary embolus is an unlikely cause of his shortness of breath, he denies any chest pain or pleuritic symptoms, as well as the fact that he is on anti-coagulation. If he indeed does improve off of amiodarone and fails to maintain sinus rhythm, he may again need to be evaluated for ablation.  We'll arrange for a direct admission to [facility].   Kenneth C. Hilty, MD, FACC  Attending Cardiologist  [facility]   

## 2013-06-10 NOTE — Patient Instructions (Signed)
STOP amiodarone  Go to [facility]. Dr. Rennis Golden wants you to be on unit 4H

## 2013-06-10 NOTE — Progress Notes (Signed)
Pt admitted to room 4E03. Pt alert and oriented x4. Partner at bedside. Assessment completed. SPO2 91% on room air and pt dyspneic. Placed 2L O2 via nasal cannula for SPO2 95%. Oriented pt to room and unit and placed call bell in reach. Pt denied pain or concerns. Vernona Rieger NP with [facility] notified. Will continue to monitor pt closely.  Juliane Lack, RN

## 2013-06-11 ENCOUNTER — Inpatient Hospital Stay (HOSPITAL_COMMUNITY): Payer: Managed Care, Other (non HMO)

## 2013-06-11 DIAGNOSIS — Z5189 Encounter for other specified aftercare: Secondary | ICD-10-CM

## 2013-06-11 DIAGNOSIS — N269 Renal sclerosis, unspecified: Secondary | ICD-10-CM

## 2013-06-11 DIAGNOSIS — N289 Disorder of kidney and ureter, unspecified: Secondary | ICD-10-CM

## 2013-06-11 DIAGNOSIS — J8410 Pulmonary fibrosis, unspecified: Secondary | ICD-10-CM

## 2013-06-11 DIAGNOSIS — N189 Chronic kidney disease, unspecified: Secondary | ICD-10-CM

## 2013-06-11 LAB — RESPIRATORY VIRUS PANEL
Influenza A H1: NOT DETECTED
Influenza B: NOT DETECTED
Metapneumovirus: NOT DETECTED
Parainfluenza 1: NOT DETECTED
Parainfluenza 2: NOT DETECTED
Parainfluenza 3: NOT DETECTED
Respiratory Syncytial Virus A: NOT DETECTED
Rhinovirus: NOT DETECTED

## 2013-06-11 LAB — BASIC METABOLIC PANEL
BUN: 43 mg/dL — ABNORMAL HIGH (ref 6–23)
CO2: 22 mEq/L (ref 19–32)
Chloride: 98 mEq/L (ref 96–112)
Creatinine, Ser: 2.34 mg/dL — ABNORMAL HIGH (ref 0.50–1.35)
Glucose, Bld: 220 mg/dL — ABNORMAL HIGH (ref 70–99)

## 2013-06-11 MED ORDER — PNEUMOCOCCAL VAC POLYVALENT 25 MCG/0.5ML IJ INJ
0.5000 mL | INJECTION | INTRAMUSCULAR | Status: DC
  Filled 2013-06-11 (×2): qty 0.5

## 2013-06-11 MED ORDER — INFLUENZA VAC SPLIT QUAD 0.5 ML IM SUSP
0.5000 mL | INTRAMUSCULAR | Status: AC
  Administered 2013-06-12: 0.5 mL via INTRAMUSCULAR
  Filled 2013-06-11: qty 0.5

## 2013-06-11 MED ORDER — METHYLPREDNISOLONE SODIUM SUCC 40 MG IJ SOLR
40.0000 mg | Freq: Three times a day (TID) | INTRAMUSCULAR | Status: DC
  Administered 2013-06-12 (×2): 40 mg via INTRAVENOUS
  Filled 2013-06-11 (×6): qty 1

## 2013-06-11 NOTE — Progress Notes (Signed)
Subjective: States that he is comfortable on 4L O2. No complaints.   Objective: Vital signs in last 24 hours: Temp:  [96.9 F (36.1 C)-98.1 F (36.7 C)] 97.1 F (36.2 C) (12/09 0553) Pulse Rate:  [76-86] 84 (12/09 0553) Resp:  [18-22] 21 (12/09 0553) BP: (122-147)/(52-80) 147/80 mmHg (12/09 0553) SpO2:  [90 %-97 %] 97 % (12/09 0553) Weight:  [237 lb 14 oz (107.9 kg)-240 lb 8.4 oz (109.1 kg)] 237 lb 14 oz (107.9 kg) (12/09 0553) Last BM Date: 06/09/13  Intake/Output from previous day: 12/08 0701 - 12/09 0700 In: 483 [P.O.:480; I.V.:3] Out: 1745 [Urine:1745] Intake/Output this shift: Total I/O In: 240 [P.O.:240] Out: 300 [Urine:300]  Medications Current Facility-Administered Medications  Medication Dose Route Frequency Provider Last Rate Last Dose  . 0.9 %  sodium chloride infusion  250 mL Intravenous PRN Nada Boozer, NP      . acetaminophen (TYLENOL) tablet 650 mg  650 mg Oral Q4H PRN Nada Boozer, NP      . allopurinol (ZYLOPRIM) tablet 300 mg  300 mg Oral Daily Nada Boozer, NP      . amLODipine (NORVASC) tablet 5 mg  5 mg Oral Daily Nada Boozer, NP      . aspirin EC tablet 81 mg  81 mg Oral QHS Nada Boozer, NP   81 mg at 06/10/13 2206  . aspirin EC tablet 81 mg  81 mg Oral Daily Nada Boozer, NP      . atorvastatin (LIPITOR) tablet 20 mg  20 mg Oral QHS [name], MD   20 mg at 06/10/13 2206  . busPIRone (BUSPAR) tablet 15 mg  15 mg Oral BID Nada Boozer, NP   15 mg at 06/10/13 2206  . cholecalciferol (VITAMIN D) tablet 1,000 Units  1,000 Units Oral Daily Nada Boozer, NP      . citalopram (CELEXA) tablet 20 mg  20 mg Oral Daily Nada Boozer, NP      . fenofibrate tablet 160 mg  160 mg Oral Daily Nada Boozer, NP      . furosemide (LASIX) injection 40 mg  40 mg Intravenous BID Nada Boozer, NP   40 mg at 06/10/13 1751  . levalbuterol [facility] HFA) inhaler 1-2 puff  1-2 puff Inhalation Q4H PRN Nada Boozer, NP      . levalbuterol Pauline Aus) nebulizer solution  0.63 mg  0.63 mg Nebulization Q8H Nada Boozer, NP   0.63 mg at 06/11/13 0500  . lisinopril (PRINIVIL,ZESTRIL) tablet 2.5 mg  2.5 mg Oral Daily Nada Boozer, NP      . methylPREDNISolone sodium succinate (SOLU-MEDROL) 125 mg/2 mL injection 60 mg  60 mg Intravenous Q6H Alyson Reedy, MD   60 mg at 06/11/13 0321  . metoprolol tartrate (LOPRESSOR) tablet 25 mg  25 mg Oral BID Nada Boozer, NP   25 mg at 06/10/13 2341  . niacin CR capsule 500 mg  500 mg Oral QHS [name], MD   500 mg at 06/10/13 2341  . omega-3 acid ethyl esters (LOVAZA) capsule 2 g  2 g Oral BID Nada Boozer, NP   2 g at 06/10/13 2206  . ondansetron (ZOFRAN) injection 4 mg  4 mg Intravenous Q6H PRN Nada Boozer, NP      . pantoprazole (PROTONIX) EC tablet 40 mg  40 mg Oral Daily Nada Boozer, NP   40 mg at 06/10/13 2257  . potassium chloride SA (K-DUR,KLOR-CON) CR tablet 40 mEq  40 mEq Oral BID Nada Boozer, NP  40 mEq at 06/10/13 2207  . Rivaroxaban (XARELTO) tablet 15 mg  15 mg Oral Daily Nada Boozer, NP      . sodium chloride 0.9 % injection 3 mL  3 mL Intravenous Q12H Nada Boozer, NP   3 mL at 06/10/13 2207  . sodium chloride 0.9 % injection 3 mL  3 mL Intravenous PRN Nada Boozer, NP      . zolpidem [facility]) tablet 10 mg  10 mg Oral QHS PRN Nada Boozer, NP   10 mg at 06/10/13 2207    PE: General appearance: alert, cooperative and no distress Lungs: Decreased BS, R>L Heart: regular rate and rhythm Extremities: no LEE Pulses: 2+ and symmetric Skin: warm and dry Neurologic: Grossly normal  Lab Results:   Recent Labs  06/10/13 1300  WBC 11.0*  HGB 11.9*  HCT 35.0*  PLT 271   BMET  Recent Labs  06/10/13 1300 06/11/13 0017  NA 140 136  K 3.5 3.2*  CL 101 98  CO2 24 22  GLUCOSE 158* 220*  BUN 42* 43*  CREATININE 2.54* 2.34*  CALCIUM 9.3 8.9   PT/INR  Recent Labs  06/10/13 1300  LABPROT 21.3*  INR 1.91*   Studies/Results:  CT of chest 06/10/13   IMPRESSION: 1. There are patchy and  confluent predominantly in perihilar airspace opacities in both lungs. This pattern is nonspecific, although does occur with amiodarone pulmonary toxicity. Additional considerations would include edema, infection and pulmonary hemorrhage. 2. There is evidence of underlying emphysema without bronchiectasis or evidence of focal mass. 3. Nonspecific prominent mediastinal lymph nodes are probably reactive. 4. Diffuse atherosclerosis status post CABG.  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   Hypoxia   Hypoxemia   Acute pulmonary edema  Plan:  Pt appears comfortable at rest, but continues on 4L supplemental O2. O2 sats at 97%. CT of chest yesterday showed patchy and confluent predominantly in perihilar airspace opacities in both lungs. This pattern is nonspecific, although does occur with amiodarone pulmonary toxicity. Additional considerations would include edema, infection and pulmonary hemorrhage. Pt was seen by Pulmonology yesterday and was placed on steroids x 3 days. Will continue to follow their recommendations.   From a cardiac standpoint, he has remained in NSR off of Amiodarone. No breakthrough a-fib overnight. HR in the 80s. Will continue to monitor closely on telemetry. Will keep on Lopressor, 25 mg BID. Continue Xarelto for AC. BNP was also elevated at 11K. Will continue IV Lasix, 40 mg BID. Morning labs show hypokalemia with K of 3.2.  Supplemental K was given earlier.    LOS: 1 day    Brittainy M. Sharol Harness, PA-C 06/11/2013 10:48 AM

## 2013-06-11 NOTE — Progress Notes (Addendum)
Pt. Seen and examined. Agree with the NP/PA-C note as written.  About 1.2L out with lasix, still requiring 4L O2 [place]. Breathing is ok. CT scan findings "highly" compatible with acute amiodarone pulmonary toxicity. Respiratory viral panel pending - he is on precautions. Continue steroids. Appreciate PCCM input.  Have discussed with Dr. Johney Frame.  If he reverts back to a-fib at some point, ablation may be his only option.  [name], MD, [facility] Attending Cardiologist [facility]

## 2013-06-11 NOTE — Evaluation (Signed)
Physical Therapy Evaluation/ Discharge Patient Details Name: [name] [name] MRN: [id number] DOB: 1956/05/23 Today's Date: 06/11/2013 Time: 4098-1191 PT Time Calculation (min): 19 min  PT Assessment / Plan / Recommendation History of Present Illness  57 year old male with PMH of COPD (unknown stage) who presents to the hospital with SOB.  Patient was started on amiodarone for rate control 2 weeks PTP during which time he was having no symptoms.  SOB started 3 days earlier and has been progressively getting worse.  Denied fever, chills, N/V, exposure, recent travel, sick contact, cough or sputum production but does relay that his sats drops in the mid 80s with activity on his home pulse ox.  Clinical Impression  Pt very pleasant and modified independent to independent with all mobility, baseline for pt. Continues to demonstrate decreased saturations with activity and required 6L to maintain sats above 90% with ambulation. Minimal cueing to decrease speed of activity for energy conservation, oxygen maintenance. Pt agreeable to no further needs as at baseline and no further education needed. Will sign off.     PT Assessment  Patent does not need any further PT services    Follow Up Recommendations  No PT follow up    Does the patient have the potential to tolerate intense rehabilitation      Barriers to Discharge        Equipment Recommendations  None recommended by PT    Recommendations for Other Services     Frequency      Precautions / Restrictions Precautions Precautions: None   Pertinent Vitals/Pain 90% on RA at rest 79% RA ambulation 90% 4L with gait 97% 6L with gait      Mobility  Transfers Transfers: Sit to Stand;Stand to Sit Sit to Stand: 6: Modified independent (Device/Increase time);From chair/3-in-1 Stand to Sit: 6: Modified independent (Device/Increase time);To chair/3-in-1 Ambulation/Gait Ambulation/Gait Assistance: 7: Independent Ambulation Distance (Feet):  300 Feet Assistive device: None Gait Pattern: Within Functional Limits Gait velocity: WFL Stairs: Yes Stairs Assistance: 6: Modified independent (Device/Increase time) Stair Management Technique: One rail Left;Alternating pattern;Forwards Number of Stairs: 4    Exercises     PT Diagnosis:    PT Problem List:   PT Treatment Interventions:       PT Goals(Current goals can be found in the care plan section) Acute Rehab PT Goals PT Goal Formulation: No goals set, d/c therapy  Visit Information  Last PT Received On: 06/11/13 Assistance Needed: +1 History of Present Illness: 57 year old male with PMH of COPD (unknown stage) who presents to the hospital with SOB.  Patient was started on amiodarone for rate control 2 weeks PTP during which time he was having no symptoms.  SOB started 3 days earlier and has been progressively getting worse.  Denied fever, chills, N/V, exposure, recent travel, sick contact, cough or sputum production but does relay that his sats drops in the mid 80s with activity on his home pulse ox.       Prior Functioning  Home Living Family/patient expects to be discharged to:: Private residence Living Arrangements: Spouse/significant other Available Help at Discharge: Family;Available PRN/intermittently Type of Home: House Home Access: Stairs to enter [employer] of Steps: 4 Entrance Stairs-Rails: Right;Left;Can reach both Home Layout: One level Home Equipment: None Prior Function Level of Independence: Independent Communication Communication: No difficulties Dominant Hand: Left    Cognition  Cognition Arousal/Alertness: Awake/alert Behavior During Therapy: WFL for tasks assessed/performed Overall Cognitive Status: Within Functional Limits for tasks assessed  Extremity/Trunk Assessment Upper Extremity Assessment Upper Extremity Assessment: Overall WFL for tasks assessed Lower Extremity Assessment Lower Extremity Assessment: Overall WFL for  tasks assessed Cervical / Trunk Assessment Cervical / Trunk Assessment: Normal   Balance    End of Session PT - End of Session Equipment Utilized During Treatment: Oxygen Activity Tolerance: Patient tolerated treatment well Patient left: in chair;with family/visitor present  GP    Garfield Cornea, SPT Toney Sang Beth 06/11/2013, 1:19 PM Delaney Meigs, PT [phone]

## 2013-06-11 NOTE — Progress Notes (Addendum)
PULMONARY  / CRITICAL CARE MEDICINE  Name: [name] [name] MRN: [id number] DOB: 02-Apr-1956    ADMISSION DATE:  06/10/2013 CONSULTATION DATE:  06/10/2013  REFERRING MD :  Dr. Rennis Golden PRIMARY SERVICE:  Cardiology  CHIEF COMPLAINT:  SOB  BRIEF PATIENT DESCRIPTION: 57 year old male with PMH of COPD (unknown stage) who presents to the hospital with SOB.  Patient was started on amiodarone for rate control 2 weeks PTP during which time he was having no symptoms.  SOB started 3 days earlier and has been progressively getting worse.  Denied fever, chills, N/V, exposure, recent travel, sick contact, cough or sputum production but does relay that his sats drops in the mid 80s with activity on his home pulse ox.  SIGNIFICANT EVENTS / STUDIES:  Chest CT 12/8>>>patchy and confluent predominantly in perihilar airspace opacities in both lungs -pattern is nonspecific    LINES / TUBES: PIV  CULTURES: Resp Viral 12/8>>>  ANTIBIOTICS: None   SUBJECTIVE: No acute events.  "feel pretty good".  Remains on 4L O2  VITAL SIGNS: Temp:  [96.9 F (36.1 C)-98.1 F (36.7 C)] 97.1 F (36.2 C) (12/09 0553) Pulse Rate:  [76-86] 84 (12/09 0553) Resp:  [18-22] 21 (12/09 0553) BP: (122-160)/(54-84) 160/84 mmHg (12/09 1136) SpO2:  [90 %-97 %] 97 % (12/09 0553) Weight:  [237 lb 14 oz (107.9 kg)] 237 lb 14 oz (107.9 kg) (12/09 0553)  PHYSICAL EXAMINATION: General:  Obese male in NAD Neuro:  Alert and interactive, NAD. HEENT:  [place]/AT, PERRL, EOM-I and MMM. Neck:  Supple, -LAN and -thryomegally. Cardiovascular:  RRR, Nl S1/S2, -M/R/G. Lungs:  Diffuse faint crackles in all lung fields. Abdomen:  Soft, NT, ND and +BS. Musculoskeletal:  -edema and -tenderness. Skin:  Intact.   Recent Labs Lab 06/10/13 1300 06/11/13 0017  NA 140 136  K 3.5 3.2*  CL 101 98  CO2 24 22  BUN 42* 43*  CREATININE 2.54* 2.34*  GLUCOSE 158* 220*    Recent Labs Lab 06/10/13 1300  HGB 11.9*  HCT 35.0*  WBC 11.0*  PLT 271    Ct Chest High Resolution  06/11/2013   CLINICAL DATA:  Shortness of breath. Past medical history of COPD and amiodarone therapy. Evaluate for interstitial lung disease.  EXAM: CHEST CT WITHOUT CONTRAST  TECHNIQUE: Multidetector CT imaging of the chest was performed following the standard protocol without intravenous contrast. High resolution imaging of the lungs, as well as inspiratory and expiratory imaging, was performed.  COMPARISON:  Chest radiographs 03/21/2012 11/29/2011. No prior CT or recent studies available.  FINDINGS: The heart is enlarged status post median sternotomy and CABG. There is atherosclerosis of the aorta, great vessels and coronary arteries. There are scattered prominent mediastinal lymph nodes, including a 1.6 cm right paratracheal node on image 23.  There is a small amount of pleural fluid bilaterally. There is no pericardial effusion. There are patchy and confluent perihilar airspace opacities in both lungs. These have asymmetric components in both upper lobes associated with mild architectural distortion. There is evidence of underlying emphysema. No bronchiectasis or endobronchial lesion is identified.  Images through the upper abdomen demonstrate no significantly increased hepatic density or focal abnormality. There is a 2.3 cm low-density lesion involving the upper pole of the right kidney consistent with a cyst. No adrenal mass or suspicious osseous finding is demonstrated.  IMPRESSION: 1. There are patchy and confluent predominantly in perihilar airspace opacities in both lungs. This pattern is nonspecific, although does occur with amiodarone pulmonary toxicity.  Additional considerations would include edema, infection and pulmonary hemorrhage. 2. There is evidence of underlying emphysema without bronchiectasis or evidence of focal mass. 3. Nonspecific prominent mediastinal lymph nodes are probably reactive. 4. Diffuse atherosclerosis status post CABG.   Electronically Signed    By: Roxy Horseman M.D.   On: 06/11/2013 09:33   Dg Chest Port 1 View  06/11/2013   CLINICAL DATA:  Hypoxia/shortness of breath  EXAM: PORTABLE CHEST - 1 VIEW  COMPARISON:  Chest radiograph March 21, 2012 and chest CT June 10, 2013  FINDINGS: There is underlying emphysema. There is widespread interstitial disease. There is no well-defined airspace consolidation. Heart is enlarged with mild pulmonary venous hypertension. No adenopathy. No pneumothorax. Patient is status post coronary artery bypass grafting.  IMPRESSION: Findings which may indicate a degree of congestive heart failure superimposed on emphysematous change. No well-defined airspace consolidation on this study.   Electronically Signed   By: Bretta Bang M.D.   On: 06/11/2013 09:06    ASSESSMENT / PLAN:  57 year old male started on amiodarone 2 wks prior to presentation.  Presenting with SOB and hypoxemia.  DDx include amiodarone toxicity or hypersensitivity pneumonitis/NSIP - no evidence of collagen vascular disease or environmental exposure Also OSA history on CPAP.  Plan: - D/C amiodarone, ablation being considered. - No bronch for now. - Ct  Steroids x 2 weeks until FU appt - PFTs done and reviewed. - Titrate O2. - Respiratory viral panel. - Albuterol PRN. - No need for biopsy/bronch for now, if not improving then will reconsider. - CPAP with O2.  Will need to determine if patient's machine is able to bleed O2 in or not prior to discharge. - Will need an ambulatory desat study prior to discharge to determine if O2 will be needed at home. - Keep close to dry weight. -Suggest dc lisinopril with CKD  [name] Brim, NP-C [facility] Pgr: [phone] or [phone]  Independently examined pt, evaluated data & formulated above care plan with NP who scribed this note & edited by me.  [name] Aerts V.  [phone]    06/11/2013, 11:47 AM

## 2013-06-11 NOTE — Progress Notes (Signed)
SATURATION QUALIFICATIONS: (This note is used to comply with regulatory documentation for home oxygen)  Patient Saturations on Room Air at Rest = 90%  Patient Saturations on Room Air while Ambulating = 79%  Patient Saturations on 4 Liters of oxygen while Ambulating = 90%, on 6L 97% with ambulation  Please briefly explain why patient needs home oxygen: pt desaturating with activity without O2 Delaney Meigs, PT [phone]

## 2013-06-12 ENCOUNTER — Inpatient Hospital Stay (HOSPITAL_COMMUNITY): Payer: Managed Care, Other (non HMO)

## 2013-06-12 DIAGNOSIS — I4891 Unspecified atrial fibrillation: Principal | ICD-10-CM

## 2013-06-12 DIAGNOSIS — I428 Other cardiomyopathies: Secondary | ICD-10-CM

## 2013-06-12 LAB — BASIC METABOLIC PANEL
CO2: 22 mEq/L (ref 19–32)
Calcium: 9.4 mg/dL (ref 8.4–10.5)
Chloride: 99 mEq/L (ref 96–112)
Creatinine, Ser: 2.51 mg/dL — ABNORMAL HIGH (ref 0.50–1.35)
GFR calc Af Amer: 31 mL/min — ABNORMAL LOW (ref >90)
Glucose, Bld: 183 mg/dL — ABNORMAL HIGH (ref 70–99)

## 2013-06-12 LAB — T3, FREE: T3, Free: 2.7 pg/mL (ref 2.3–4.2)

## 2013-06-12 LAB — PRO B NATRIURETIC PEPTIDE: Pro B Natriuretic peptide (BNP): 1559 pg/mL — ABNORMAL HIGH (ref 0–125)

## 2013-06-12 MED ORDER — FUROSEMIDE 40 MG PO TABS: 40.0000 mg | ORAL_TABLET | Freq: Every day | ORAL | Status: DC

## 2013-06-12 MED ORDER — LEVALBUTEROL HCL 0.63 MG/3ML IN NEBU
0.6300 mg | INHALATION_SOLUTION | Freq: Three times a day (TID) | RESPIRATORY_TRACT | Status: DC
  Administered 2013-06-13 (×2): 0.63 mg via RESPIRATORY_TRACT
  Filled 2013-06-12 (×4): qty 3

## 2013-06-12 MED ORDER — PREDNISONE 20 MG PO TABS
40.0000 mg | ORAL_TABLET | Freq: Every day | ORAL | Status: DC
  Administered 2013-06-13: 06:00:00 40 mg via ORAL
  Filled 2013-06-12 (×2): qty 2

## 2013-06-12 MED ORDER — FUROSEMIDE 40 MG PO TABS
40.0000 mg | ORAL_TABLET | Freq: Every day | ORAL | Status: DC
  Administered 2013-06-13: 40 mg via ORAL
  Filled 2013-06-12 (×2): qty 1

## 2013-06-12 NOTE — Progress Notes (Signed)
PULMONARY  / CRITICAL CARE MEDICINE  Name: [name] [name] MRN: [id number] DOB: Apr 07, 1956    ADMISSION DATE:  06/10/2013 CONSULTATION DATE:  06/10/2013  REFERRING MD :  Dr. Rennis Golden PRIMARY SERVICE:  Cardiology  CHIEF COMPLAINT:  SOB  BRIEF PATIENT DESCRIPTION: 57 year old male with PMH of COPD (unknown stage) who presents to the hospital with SOB.  Patient was started on amiodarone for rate control 2 weeks PTP during which time he was having no symptoms.  SOB started 3 days earlier and has been progressively getting worse.  Denied fever, chills, N/V, exposure, recent travel, sick contact, cough or sputum production but does relay that his sats drops in the mid 80s with activity on his home pulse ox.  SIGNIFICANT EVENTS / STUDIES:  Chest CT 12/8>>>patchy and confluent predominantly in perihilar airspace opacities in both lungs -pattern is nonspecific   LINES / TUBES: PIV  CULTURES: Resp Viral 12/8>>>neg  ANTIBIOTICS: None   SUBJECTIVE: Remains on 3.5L O2, very dyspneic with apprx 5 ft ambulation.  Weight is down.   VITAL SIGNS: Temp:  [97.1 F (36.2 C)-98 F (36.7 C)] 98 F (36.7 C) (12/10 0411) Pulse Rate:  [81-86] 84 (12/10 0615) Resp:  [16-20] 18 (12/10 0615) BP: (122-141)/(60-77) 141/77 mmHg (12/10 0900) SpO2:  [95 %-99 %] 95 % (12/10 0411) Weight:  [235 lb 10.8 oz (106.9 kg)] 235 lb 10.8 oz (106.9 kg) (12/10 0411)  PHYSICAL EXAMINATION: General:  Obese male in NAD Neuro:  Alert and interactive, NAD. HEENT:  [place]/AT, PERRL, EOM-I and MMM. Neck:  Supple, -LAN and -thryomegally. Cardiovascular:  RRR, Nl S1/S2, -M/R/G. Lungs:  resp's labored w activity, lungs bilaterally with faint exp crackles (improved) Abdomen:  Soft, NT, ND and +BS. Musculoskeletal:  -edema and -tenderness. Skin:  Intact.   Recent Labs Lab 06/10/13 1300 06/11/13 0017 06/12/13 0512  NA 140 136 136  K 3.5 3.2* 4.1  CL 101 98 99  CO2 24 22 22   BUN 42* 43* 62*  CREATININE 2.54* 2.34* 2.51*   GLUCOSE 158* 220* 183*    Recent Labs Lab 06/10/13 1300  HGB 11.9*  HCT 35.0*  WBC 11.0*  PLT 271   Dg Chest 2 View  06/12/2013   CLINICAL DATA:  Status airspace disease.  EXAM: CHEST  2 VIEW  COMPARISON:  Chest radiograph 06/11/2013  FINDINGS: Stable enlarged cardiac and mediastinal contours status post median sternotomy. No significant interval change diffuse bilateral interstitial pulmonary opacities. No definite pleural effusion or pneumothorax. Regional skeleton is grossly unremarkable.  IMPRESSION: Grossly stable bilateral interstitial pulmonary opacities.   Electronically Signed   By: Annia Belt M.D.   On: 06/12/2013 07:51   Ct Chest High Resolution  06/11/2013   CLINICAL DATA:  Shortness of breath. Past medical history of COPD and amiodarone therapy. Evaluate for interstitial lung disease.  EXAM: CHEST CT WITHOUT CONTRAST  TECHNIQUE: Multidetector CT imaging of the chest was performed following the standard protocol without intravenous contrast. High resolution imaging of the lungs, as well as inspiratory and expiratory imaging, was performed.  COMPARISON:  Chest radiographs 03/21/2012 11/29/2011. No prior CT or recent studies available.  FINDINGS: The heart is enlarged status post median sternotomy and CABG. There is atherosclerosis of the aorta, great vessels and coronary arteries. There are scattered prominent mediastinal lymph nodes, including a 1.6 cm right paratracheal node on image 23.  There is a small amount of pleural fluid bilaterally. There is no pericardial effusion. There are patchy and confluent perihilar airspace  opacities in both lungs. These have asymmetric components in both upper lobes associated with mild architectural distortion. There is evidence of underlying emphysema. No bronchiectasis or endobronchial lesion is identified.  Images through the upper abdomen demonstrate no significantly increased hepatic density or focal abnormality. There is a 2.3 cm low-density  lesion involving the upper pole of the right kidney consistent with a cyst. No adrenal mass or suspicious osseous finding is demonstrated.  IMPRESSION: 1. There are patchy and confluent predominantly in perihilar airspace opacities in both lungs. This pattern is nonspecific, although does occur with amiodarone pulmonary toxicity. Additional considerations would include edema, infection and pulmonary hemorrhage. 2. There is evidence of underlying emphysema without bronchiectasis or evidence of focal mass. 3. Nonspecific prominent mediastinal lymph nodes are probably reactive. 4. Diffuse atherosclerosis status post CABG.   Electronically Signed   By: Roxy Horseman M.D.   On: 06/11/2013 09:33   Dg Chest Port 1 View  06/11/2013   CLINICAL DATA:  Hypoxia/shortness of breath  EXAM: PORTABLE CHEST - 1 VIEW  COMPARISON:  Chest radiograph March 21, 2012 and chest CT June 10, 2013  FINDINGS: There is underlying emphysema. There is widespread interstitial disease. There is no well-defined airspace consolidation. Heart is enlarged with mild pulmonary venous hypertension. No adenopathy. No pneumothorax. Patient is status post coronary artery bypass grafting.  IMPRESSION: Findings which may indicate a degree of congestive heart failure superimposed on emphysematous change. No well-defined airspace consolidation on this study.   Electronically Signed   By: Bretta Bang M.D.   On: 06/11/2013 09:06    ASSESSMENT / PLAN:  57 year old male started on amiodarone 2 wks prior to presentation.  Presenting with SOB and hypoxemia.  DDx include amiodarone toxicity or hypersensitivity pneumonitis/NSIP - no evidence of collagen vascular disease or environmental exposure Also OSA history on CPAP. - Respiratory viral panel neg  Steroid induced hyperglycemia  Plan: - D/C amiodarone, ablation being considered. - No bronch for now. - Change to prednisone 40 mg- taper by 10 mg q 3 days & stay at 20 mg until  FU appt with  [place] in 2 weeks - Titrate O2, will need ambulatory desat prior to d/c for O2 assessment  - Albuterol PRN. - No need for biopsy/bronch for now, if not improving then will reconsider. - CPAP with O2.  - Keep close to dry weight. - Suggest dc lisinopril with CKD  PCCM to sign off   Cyril Mourning MD. Tonny Bollman. [facility] Pager [phone] If no response call [phone]    06/12/2013, 12:04 PM

## 2013-06-12 NOTE — Progress Notes (Signed)
Subjective: Ok will on O2  Objective: Vital signs in last 24 hours: Temp:  [97.1 F (36.2 C)-98 F (36.7 C)] 98 F (36.7 C) (12/10 0411) Pulse Rate:  [81-86] 84 (12/10 0615) Resp:  [16-20] 18 (12/10 0615) BP: (122-160)/(60-84) 141/77 mmHg (12/10 0900) SpO2:  [95 %-99 %] 95 % (12/10 0411) Weight:  [235 lb 10.8 oz (106.9 kg)] 235 lb 10.8 oz (106.9 kg) (12/10 0411) Last BM Date: 06/11/13  Intake/Output from previous day: 12/09 0701 - 12/10 0700 In: 1180 [P.O.:1180] Out: 1500 [Urine:1500] Intake/Output this shift: Total I/O In: 342 [P.O.:342] Out: 225 [Urine:225]  Medications Current Facility-Administered Medications  Medication Dose Route Frequency Provider Last Rate Last Dose  . 0.9 %  sodium chloride infusion  250 mL Intravenous PRN Nada Boozer, NP      . acetaminophen (TYLENOL) tablet 650 mg  650 mg Oral Q4H PRN Nada Boozer, NP      . allopurinol (ZYLOPRIM) tablet 300 mg  300 mg Oral Daily Nada Boozer, NP   300 mg at 06/11/13 1136  . amLODipine (NORVASC) tablet 5 mg  5 mg Oral Daily Nada Boozer, NP   5 mg at 06/11/13 1136  . aspirin EC tablet 81 mg  81 mg Oral QHS Nada Boozer, NP   81 mg at 06/11/13 2254  . aspirin EC tablet 81 mg  81 mg Oral Daily Nada Boozer, NP   81 mg at 06/11/13 1138  . atorvastatin (LIPITOR) tablet 20 mg  20 mg Oral QHS [name], MD   20 mg at 06/11/13 2254  . busPIRone (BUSPAR) tablet 15 mg  15 mg Oral BID Nada Boozer, NP   15 mg at 06/11/13 2255  . cholecalciferol (VITAMIN D) tablet 1,000 Units  1,000 Units Oral Daily Nada Boozer, NP   1,000 Units at 06/11/13 1140  . citalopram (CELEXA) tablet 20 mg  20 mg Oral Daily Nada Boozer, NP   20 mg at 06/11/13 1139  . fenofibrate tablet 160 mg  160 mg Oral Daily Nada Boozer, NP   160 mg at 06/11/13 1139  . furosemide (LASIX) injection 40 mg  40 mg Intravenous BID Nada Boozer, NP   40 mg at 06/12/13 0841  . influenza vac split quadrivalent PF (FLUARIX) injection 0.5 mL  0.5 mL  Intramuscular Tomorrow-1000 [name], MD      . levalbuterol [facility] HFA) inhaler 1-2 puff  1-2 puff Inhalation Q4H PRN Nada Boozer, NP      . levalbuterol Pauline Aus) nebulizer solution 0.63 mg  0.63 mg Nebulization Q8H Nada Boozer, NP   0.63 mg at 06/12/13 0616  . methylPREDNISolone sodium succinate (SOLU-MEDROL) 40 mg/mL injection 40 mg  40 mg Intravenous Q8H Oretha Milch, MD   40 mg at 06/12/13 6213  . metoprolol tartrate (LOPRESSOR) tablet 25 mg  25 mg Oral BID Nada Boozer, NP   25 mg at 06/11/13 2255  . niacin CR capsule 500 mg  500 mg Oral QHS [name], MD   500 mg at 06/11/13 2255  . omega-3 acid ethyl esters (LOVAZA) capsule 2 g  2 g Oral BID Nada Boozer, NP   2 g at 06/11/13 2254  . ondansetron (ZOFRAN) injection 4 mg  4 mg Intravenous Q6H PRN Nada Boozer, NP      . pantoprazole (PROTONIX) EC tablet 40 mg  40 mg Oral Daily Nada Boozer, NP   40 mg at 06/11/13 1148  . pneumococcal 23 valent vaccine (PNU-IMMUNE) injection 0.5  mL  0.5 mL Intramuscular Tomorrow-1000 [name], MD      . potassium chloride SA (K-DUR,KLOR-CON) CR tablet 40 mEq  40 mEq Oral BID Nada Boozer, NP   40 mEq at 06/11/13 2254  . Rivaroxaban (XARELTO) tablet 15 mg  15 mg Oral Daily Nada Boozer, NP   15 mg at 06/11/13 1142  . sodium chloride 0.9 % injection 3 mL  3 mL Intravenous Q12H Nada Boozer, NP   3 mL at 06/11/13 2256  . sodium chloride 0.9 % injection 3 mL  3 mL Intravenous PRN Nada Boozer, NP      . zolpidem [facility]) tablet 10 mg  10 mg Oral QHS PRN Nada Boozer, NP   10 mg at 06/11/13 2259    PE: General appearance: alert, cooperative and no distress Lungs: clear to auscultation bilaterally Heart: regular rate and rhythm, S1, S2 normal, no murmur, click, rub or gallop Extremities: Trace LEE Pulses: 2+ and symmetric Skin: Warm and dry Neurologic: Grossly normal  Lab Results:   Recent Labs  06/10/13 1300  WBC 11.0*  HGB 11.9*  HCT 35.0*  PLT 271   BMET  Recent  Labs  06/10/13 1300 06/11/13 0017 06/12/13 0512  NA 140 136 136  K 3.5 3.2* 4.1  CL 101 98 99  CO2 24 22 22   GLUCOSE 158* 220* 183*  BUN 42* 43* 62*  CREATININE 2.54* 2.34* 2.51*  CALCIUM 9.3 8.9 9.4   PT/INR  Recent Labs  06/10/13 1300  LABPROT 21.3*  INR 1.91*    Assessment/Plan  Principal Problem:   Amiodarone pulmonary toxicity Active Problems:   Atrial fibrillation with RVR- DCCV 04/19/13 to NSR   Chronic anticoagulation   Acute systolic congestive heart failure, NYHA class 4   S/P CABG x 3 2005. Patent grafts 04/2013   Nonischemic cardiomyopathy, ef 30-40% by echo 03/2013 down from 49%, was in atrial fib  at time of Echo   CKD (chronic kidney disease) stage 3, GFR 30-59 ml/min, recently was CKD stage 4 with diuressing.   Hypoxia   Hypoxemia   Acute pulmonary edema  Plan:  Net fluids:  -0.3l/-1.6L.  CXR: Grossly stable bilateral interstitial pulmonary opacities.   K+ WNL.  Bp mildly elevated.  TSH elevated.  Will check t4/t3. May also be related to Amio.  Sinus rhythm currently.  Considering ablation if Afib returns.  PCCM following.   On steroids.  Will recheck BNP today.  His wt is 235# and was 244# in the office May 01, 2013.  Was 11K two days ago.   Changing to PO lasix.  Scr stable(CKD).    LOS: 2 days    [name], [name] 06/12/2013 9:02 AM  I have seen and examined the patient along with Wilburt Finlay, PA.  I have reviewed the chart, notes and new data.  I agree with PA's note.  Key new complaints: feels much better Key examination changes: in NSR, lungs sound clear, but still tachypneic on O2 at 5 L/min Key new findings / data: very rapid improvement in pro BNP (11000 to 1000) Echo c/w severely elevated LA pressure  PLAN: Hopefully his decompensation was primarily due to CHF rather than amiodarone lung. His rapid improvement after 2L diuresis would suggest this. Either way, I don't think we should risk giving him amiodarone ever again. There are no good  remaining antiarrhythmic options secondary to CHF, CAD and renal insufficiency. He has discussed RF ablation with the EP service and this seems to be  the only viable alternative, even though success likelihood is not the best given the extent of his structural heart disease. Thurmon Fair, MD, [facility] [phone] 06/12/2013, 2:24 PM

## 2013-06-12 NOTE — Progress Notes (Signed)
Pt. Alert and oriented this am. No s/s of distress or discomfort noted. Pt. Stated he accidentally pulled IV out during the night. RN at bedside to assess pt. Bleeding noted from IV site.  Pressure held and bleeding stopped. IV catheter intact and discarded. Pt. Made comfortable and resting in bed. RN will continue to monitor. Ashanti Littles, Cheryll Dessert

## 2013-06-13 ENCOUNTER — Other Ambulatory Visit: Payer: Self-pay | Admitting: Cardiology

## 2013-06-13 DIAGNOSIS — I5023 Acute on chronic systolic (congestive) heart failure: Secondary | ICD-10-CM

## 2013-06-13 DIAGNOSIS — R739 Hyperglycemia, unspecified: Secondary | ICD-10-CM | POA: Diagnosis not present

## 2013-06-13 DIAGNOSIS — R0602 Shortness of breath: Secondary | ICD-10-CM

## 2013-06-13 LAB — BASIC METABOLIC PANEL
BUN: 67 mg/dL — ABNORMAL HIGH (ref 6–23)
Calcium: 9.4 mg/dL (ref 8.4–10.5)
Chloride: 104 mEq/L (ref 96–112)
GFR calc Af Amer: 33 mL/min — ABNORMAL LOW (ref >90)
GFR calc non Af Amer: 28 mL/min — ABNORMAL LOW (ref >90)
Glucose, Bld: 159 mg/dL — ABNORMAL HIGH (ref 70–99)
Potassium: 4.9 mEq/L (ref 3.5–5.1)
Sodium: 137 mEq/L (ref 135–145)

## 2013-06-13 MED ORDER — FUROSEMIDE 10 MG/ML IJ SOLN
40.0000 mg | Freq: Once | INTRAMUSCULAR | Status: AC
  Administered 2013-06-13: 40 mg via INTRAVENOUS

## 2013-06-13 MED ORDER — ATORVASTATIN CALCIUM 20 MG PO TABS: 20.0000 mg | ORAL_TABLET | Freq: Every day | ORAL | Status: AC

## 2013-06-13 MED ORDER — POTASSIUM CHLORIDE CRYS ER 20 MEQ PO TBCR: 40.0000 meq | EXTENDED_RELEASE_TABLET | Freq: Every day | ORAL | Status: AC

## 2013-06-13 MED ORDER — NIACIN ER 500 MG PO CPCR: 500.0000 mg | ORAL_CAPSULE | Freq: Every day | ORAL | Status: AC

## 2013-06-13 MED ORDER — FUROSEMIDE 80 MG PO TABS: 80.0000 mg | ORAL_TABLET | Freq: Every day | ORAL | Status: DC

## 2013-06-13 MED ORDER — FUROSEMIDE 40 MG PO TABS: 80.0000 mg | ORAL_TABLET | Freq: Every day | ORAL | Status: DC

## 2013-06-13 MED ORDER — PREDNISONE 20 MG PO TABS: 40.0000 mg | ORAL_TABLET | Freq: Every day | ORAL | Status: DC

## 2013-06-13 NOTE — Progress Notes (Signed)
All d/c instructions explained as given and verbalized understanding.  D/c home with o2.  Transported off floor by NT to awaiting transport.  Amanda Pea, Charity fundraiser.

## 2013-06-13 NOTE — Discharge Summary (Signed)
Physician Discharge Summary       Patient ID: [name] [name] MRN: [id number] DOB/AGE: [birth year] 58 y.o.  Admit date: 06/10/2013 Discharge date: 06/13/2013  Discharge Diagnoses:  Principal Problem:   Amiodarone pulmonary toxicity Active Problems:   Atrial fibrillation with RVR- DCCV 04/19/13 to NSR   Chronic anticoagulation   Acute systolic congestive heart failure, NYHA class 4   S/P CABG x 3 2005. Patent grafts 04/2013   Nonischemic cardiomyopathy, ef 30-40% by echo 03/2013 down from 49%, was in atrial fib  at time of Echo   CKD (chronic kidney disease) stage 3, GFR 30-59 ml/min, recently was CKD stage 4 with diuressing.   Hypoxia   Hypoxemia   Acute pulmonary edema   Hyperglycemia, with steroids   Discharged Condition: good  Procedures: none  Hospital Course:  57 year old [name] [name] with a past medical history significant for coronary artery disease and bypass surgery x 3 vessels in 2005 (LIMA to LAD, SVG to DIAG, and SVG to OM1/OM2). He also has hyperlipidemia and hypertriglyceridemia with triglycerides of over 3000 in the past, morbid obesity, OSA on CPAP and recently atrial fibrillation. Most recent labs showed triglycerides of 306, HDL of 36, VLDL 61 and LDL of 89. He had an outpatient cardiac catheterization on Nov 18, 2010 that showed the grafts were widely patent. Because of a solitary kidney he did not have an LVgram. His last creatinine October 11, 2012 was 1.58. The BUN was 31. The patient was seen on October 10, 2012 and was found to be in atrial fibrillation. He was scheduled for repeat cardioversion and had been placed on Xarelto at that time. Dr. Rennis Golden did not feel it was safe without general anesthesia to do the TEE due to his history of obstructive sleep apnea and relatively thick neck. The patient also had interrupted his Xarelto for 1 day due to gingival bleeding. He did undergo successful cardioversion to sinus rhythm after several weeks of anticoagulation and reported that he  felt better. Subsequently he had an echocardiogram which showed a newly reduced ejection fraction of 35%. This is in the setting of worsening shortness of breath. Dr. Rennis Golden recently started him on low-dose Lasix however his creatinine was rising above 3. He has never seen a nephrologist. As above, he is known to have a solitary kidney. 10/16 he returned for follow up and reported markedly increased shortness of breath without much improvement with diuresis. It is noted that he is in atrial fibrillation with rapid ventricular response. I suspect either this or coronary bypass graft dysfunction may be contributing to his heart failure. He was noted to be in atrial fib on echo in Sept 2014. EF 30-40%, mild to mod.[name], OA ok pressure 41 mmHg. Pt admitted to [facility]. RHC was completed but LHC held due to rising Cr. Lexiscan myoview completed. Also DCCV to SR. With abnormal myoview pt underwent LHC Finding patent grafts. LVEDP 29 mmHg. Renal also followed.   He was recently seen in the office with recurrent atrial fibrillation and rapid ventricular response. Dr. Rennis Golden increased his metoprolol to 25 mg twice daily and referred him to Dr. Johney [name] for evaluation of possible antiarrhythmic therapy versus atrial fibrillation ablation. [name] [name] had limited options for antiarrhythmic therapy, but he had not failed treatment to this point. Dr. Johney [name] recommended starting amiodarone and he has been taking 400 mg twice daily for a loading dose. He reports over the past several days that he has had marked increase in shortness of breath  and in fact over the weekend was not able to do much in the way of activity and essentially slept all day. He has a pulse oximeter at home which are showing rest oxygen saturations in the mid-80s. He has not had significant weight gain and, in fact his weight is actually 3 pounds lighter than his last office visit. His EKG on admit did show sinus rhythm at 77 with a left bundle branch block and PVCs.  He was able to walk 2 laps around the office but then was completely exhausted with oxygen saturations in the low to mid 80s. He was placed on oxygen in the office with a quick improvement in his symptoms.  Dr. Rennis Golden admitted for Heart failure and possible amiodarone toxicity.  CCM/Pulmonary consult was obtained.  CT of chest was completed and IV steroids started.  IV diuretics were also started.  Pt began to improve with both steroids and diuresing.  He has been maintaining SR.  TSH was elevated but normal freeT3 & T4.  Will monitor now that Amiodarone has been stopped.  Pt continues to improve with a drop in oxygen with ambulation and at rest on Room Air.  Room Air at rest 85%, RA ambulation 82%, 4 L [place] with ambulation 86%.  He will need home oxygen and oxygen in his cpap at night.  At discharge home oxygen has been obtained, pt in SR with plans for ablation if a.fib reoccurs.  Will mark chart intolerant to AMiodarone.  This medication was stopped on admit.  We will discharge with tapering dose of steroid, continue Xarelto, and we will see him back Monday and check labs and CXR at that time.  Discharge weight 233.8 down from 240.8 on admit.      Discharge Pro BNP  1,559 down from 11,110.0     Consults: pulmonary/intensive care  Significant Diagnostic Studies:  BNP (last 3 results)  Recent Labs  05/08/13 0739 06/10/13 1233 06/12/13 1125  PROBNP 874.60* 11110.0* 1559.0*   BMET    Component Value Date/Time   NA 137 06/13/2013 0516   K 4.9 06/13/2013 0516   CL 104 06/13/2013 0516   CO2 21 06/13/2013 0516   GLUCOSE 159* 06/13/2013 0516   BUN 67* 06/13/2013 0516   CREATININE 2.38* 06/13/2013 0516   CREATININE 2.16* 05/08/2013 0739   CALCIUM 9.4 06/13/2013 0516   GFRNONAA 28* 06/13/2013 0516   GFRAA 33* 06/13/2013 0516    CBC    Component Value Date/Time   WBC 11.0* 06/10/2013 1300   RBC 3.69* 06/10/2013 1300   HGB 11.9* 06/10/2013 1300   HCT 35.0* 06/10/2013 1300   PLT 271  06/10/2013 1300   MCV 94.9 06/10/2013 1300   MCH 32.2 06/10/2013 1300   MCHC 34.0 06/10/2013 1300   RDW 15.4 06/10/2013 1300   LYMPHSABS 2.2 06/10/2013 1300   MONOABS 0.9 06/10/2013 1300   EOSABS 0.0 06/10/2013 1300   BASOSABS 0.0 06/10/2013 1300    Troponin Is were negative.    TSH was elevated at 8.873 with normal Free T4 of 1.09 and free T3 2.7, will monitor.  Negative Respiratory virus panel   CT Chest:  IMPRESSION: 1. There are patchy and confluent predominantly in perihilar airspace opacities in both lungs. This pattern is nonspecific, although does occur with amiodarone pulmonary toxicity. Additional considerations would include edema, infection and pulmonary hemorrhage. 2. There is evidence of underlying emphysema without bronchiectasis or evidence of focal mass. 3. Nonspecific prominent mediastinal lymph nodes are probably reactive.  4. Diffuse atherosclerosis status post CABG.  CXR 06/12/13 compared to admit: FINDINGS: Stable enlarged cardiac and mediastinal contours status post median sternotomy. No significant interval change diffuse bilateral interstitial pulmonary opacities. No definite pleural effusion or pneumothorax. Regional skeleton is grossly unremarkable.  IMPRESSION: Grossly stable bilateral interstitial pulmonary opacities.   2D Echo: - Left ventricle: The cavity size was mildly dilated. Wall thickness was increased in a pattern of mild LVH. There was mild concentric hypertrophy. Systolic function was moderately reduced. The estimated ejection fraction was in the range of 35% to 45%. There was a reduced contribution of atrial contraction to ventricular filling, due to increased ventricular diastolic pressure or atrial contractile dysfunction. Doppler parameters are consistent with a reversible restrictive pattern, indicative of decreased left ventricular diastolic compliance and/or increased left atrial pressure (grade 3 diastolic dysfunction).  Doppler parameters are consistent with both elevated ventricular end-diastolic filling pressure and elevated left atrial filling pressure. Acoustic contrast opacification revealed no evidence ofthrombus. - Mitral valve: Mildly calcified annulus. Mild regurgitation. - Right ventricle: The cavity size was mildly dilated. Systolic pressure was increased. - Tricuspid valve: Moderate regurgitation directed toward the septum. - Pulmonary arteries: PA peak pressure: 45mm Hg (S).     Discharge Exam: Blood pressure 148/69, pulse 88, temperature 97.5 F (36.4 C), temperature source Axillary, resp. rate 18, height 5\' 8"  (1.727 m), weight 233 lb 8 oz (105.915 kg), SpO2 93.00%.  AM exam:   PE: General:Pleasant affect, NAD  Skin:Warm and dry, brisk capillary refill  HEENT:normocephalic, sclera clear, mucus membranes moist  Neck:supple, no JVD,sitting in chair  Heart:S1S2 RRR without murmur, gallup, rub or click  Lungs:clear without rales, rhonchi, or wheezes  ION:GEXBM,WUXL, non tender, + BS, do not palpate liver spleen or masses  Ext:no lower ext edema, 2+ pedal pulses, 2+ radial pulses  Neuro:alert and oriented, MAE, follows commands, + facial symmetry     Disposition: 01-Home or Self Care   Future Appointments Provider Department Dept Phone   06/17/2013 10:20 AM Nada Boozer, NP [facility] [phone]   06/24/2013 4:00 PM Kalman Shan, MD [facility] [phone]   07/03/2013 9:00 AM [name], MD [facility] [phone]   07/24/2013 2:15 PM Hillis Range, MD [facility] Office [phone]       Medication List    STOP taking these medications       amiodarone 200 MG tablet  Commonly known as:  PACERONE     SIMCOR 1000-40 MG Tb24  Generic drug:  Niacin-Simvastatin      TAKE these medications       acetaminophen 500 MG tablet  Commonly known as:  TYLENOL  Take 1,000 mg by mouth every 6 (six) hours as needed for  pain.     allopurinol 300 MG tablet  Commonly known as:  ZYLOPRIM  take 1 tablet by mouth once daily     amLODipine 5 MG tablet  Commonly known as:  NORVASC  Take 5 mg by mouth daily.     aspirin EC 81 MG tablet  Take 81 mg by mouth at bedtime.     atorvastatin 20 MG tablet  Commonly known as:  LIPITOR  Take 1 tablet (20 mg total) by mouth at bedtime.     busPIRone 15 MG tablet  Commonly known as:  BUSPAR  Take 15 mg by mouth 2 (two) times daily. 10am and 5pm     cholecalciferol 1000 UNITS tablet  Commonly known as:  VITAMIN D  Take 1,000 Units by mouth daily.     citalopram 20 MG tablet  Commonly known as:  CELEXA  Take 20 mg by mouth daily.     FORTESTA 10 MG/ACT (2%) Gel  Generic drug:  Testosterone  apply 4 PUMPS once daily to SKIN     furosemide 40 MG tablet  Commonly known as:  LASIX  Take 2 tablets (80 mg total) by mouth daily.     glucose blood test strip  Use as instructed(THIS IS FOR THE ONETOUCH VERIO IQ )     levalbuterol 45 MCG/ACT inhaler  Commonly known as:  XOPENEX HFA  Inhale 1-2 puffs into the lungs every 4 (four) hours as needed for wheezing.     metoprolol tartrate 25 MG tablet  Commonly known as:  LOPRESSOR  Take 25 mg by mouth 2 (two) times daily.     niacin 500 MG CR capsule  Take 1 capsule (500 mg total) by mouth at bedtime.     omega-3 acid ethyl esters 1 G capsule  Commonly known as:  LOVAZA  Take 2 g by mouth 2 (two) times daily.     ONETOUCH DELICA LANCETS FINE Misc  1 each by Does not apply route 2 (two) times daily.     potassium chloride SA 20 MEQ tablet  Commonly known as:  K-DUR,KLOR-CON  Take 2 tablets (40 mEq total) by mouth daily.     predniSONE 20 MG tablet  Commonly known as:  DELTASONE  - Take 2 tablets (40 mg total) by mouth daily with breakfast. Take 40 mg (2 tablets of 20 mg)  12/12; 12/13; 12/14 with BK.  - Then decrease to 1.5 tablets (30 mg) for 12/15; 12/16; 12/17 with BK  - Then decrease to 1 tablet  daily, (20 mg) and continue at this dose.     PREVACID PO  Take 1 capsule by mouth at bedtime.     TRILIPIX 135 MG capsule  Generic drug:  Choline Fenofibrate  Take 135 mg by mouth daily.     XARELTO 15 MG Tabs tablet  Generic drug:  Rivaroxaban  Take 15 mg by mouth daily.           Follow-up Information   Follow up with [facility], MD On 06/24/2013. (Appt at 4 PM)    Specialty:  Pulmonary Disease   Contact information:   [address] [place] [address] [phone]       Follow up with [facility] R, NP. (at 10:20 AM for follow up )    Specialty:  Cardiology   Contact information:   [address] [place] [address] [phone]        Discharge Instructions: We are arranging home oxygen for you.  Please continue at 3 L.  We will need to add the oxygen to CPAP.  Continue cpap.  Your steroid dose is tapered.  Though only to 20 mg then continue at 20 mg daily see below for details.  Weigh daily Call [phone] if weight climbs more than 3 pounds in a day or 5 pounds in a week. No salt to very little salt in your diet.  No more than 2000 mg in a day. Call if increased shortness of breath or increased swelling.  You will need lab work on Monday 06/17/13  First floor of our office.  You will need CXR next week at [facility].  Our office will call with the date and time.  For the Prednisone:  Take  40 mg (2 tablets of 20 mg)  12/12; 12/13; 12/14 with BK. Then decrease to 1.5 tablets (30 mg) for 12/15; 12/16; 12/17 with BK Then decrease to 1 tablet daily, (20 mg) and continue at this dose.   Watch your diet on the steroids, your glucose is running a little high, so decrease sweets and foods high in carbs- white bread, white rice better to use brown bread and rice.  We changed your simcor to Niacin and Lipitor due to interaction with zocor and other meds.  Signed: Leone Brand Nurse Practitioner-Certified [facility]:  [facility] 06/13/2013, 12:12 PM  Time spent on discharge :45 minutes.

## 2013-06-13 NOTE — Progress Notes (Signed)
Subjective: Feeling better, oxygen down to 3 L with sp02 at 93-96%  Objective: Vital signs in last 24 hours: Temp:  [97.3 F (36.3 C)-98.2 F (36.8 C)] 97.5 F (36.4 C) (12/11 0543) Pulse Rate:  [71-90] 71 (12/11 0543) Resp:  [18-19] 18 (12/11 0543) BP: (140-151)/(63-77) 146/74 mmHg (12/11 0543) SpO2:  [93 %-100 %] 93 % (12/11 0829) Weight:  [233 lb 8 oz (105.915 kg)] 233 lb 8 oz (105.915 kg) (12/11 0543) Weight change: -2 lb 2.8 oz (-0.985 kg) Last BM Date: 06/12/13 Intake/Output from previous day: +37 (total since admit -1545)  Wt 233.8 down from 240 on admit 12/10 0701 - 12/11 0700 In: 1562 [P.O.:1562] Out: 1525 [Urine:1525] Intake/Output this shift:    PE: General:Pleasant affect, NAD Skin:Warm and dry, brisk capillary refill HEENT:normocephalic, sclera clear, mucus membranes moist Neck:supple, no JVD,sitting in chair Heart:S1S2 RRR without murmur, gallup, rub or click Lungs:clear without rales, rhonchi, or wheezes WJX:BJYNW,GNFA, non tender, + BS, do not palpate liver spleen or masses Ext:no lower ext edema, 2+ pedal pulses, 2+ radial pulses Neuro:alert and oriented, MAE, follows commands, + facial symmetry  Lab Results:  Recent Labs  06/10/13 1300  WBC 11.0*  HGB 11.9*  HCT 35.0*  PLT 271   BMET  Recent Labs  06/12/13 0512 06/13/13 0516  NA 136 137  K 4.1 4.9  CL 99 104  CO2 22 21  GLUCOSE 183* 159*  BUN 62* 67*  CREATININE 2.51* 2.38*  CALCIUM 9.4 9.4    Recent Labs  06/10/13 1805 06/11/13 0017  TROPONINI <0.30 <0.30    Lab Results  Component Value Date   CHOL 186 10/09/2012   HDL 36* 10/09/2012   LDLCALC 89 10/09/2012   TRIG 306* 10/09/2012   CHOLHDL 5.2 10/09/2012   Lab Results  Component Value Date   HGBA1C 7.5 03/13/2013     Lab Results  Component Value Date   TSH 8.873* 06/10/2013    Hepatic Function Panel  Recent Labs  06/10/13 1300  PROT 7.8  ALBUMIN 3.4*  AST 18  ALT 13  ALKPHOS 40  BILITOT 0.6        Studies/Results: Dg Chest 2 View  06/12/2013   CLINICAL DATA:  Status airspace disease.  EXAM: CHEST  2 VIEW  COMPARISON:  Chest radiograph 06/11/2013  FINDINGS: Stable enlarged cardiac and mediastinal contours status post median sternotomy. No significant interval change diffuse bilateral interstitial pulmonary opacities. No definite pleural effusion or pneumothorax. Regional skeleton is grossly unremarkable.  IMPRESSION: Grossly stable bilateral interstitial pulmonary opacities.   Electronically Signed   By: Annia Belt M.D.   On: 06/12/2013 07:51   2D Echo: Study Conclusions  - Left ventricle: The cavity size was mildly dilated. Wall thickness was increased in a pattern of mild LVH. There was mild concentric hypertrophy. Systolic function was moderately reduced. The estimated ejection fraction was in the range of 35% to 45%. There was a reduced contribution of atrial contraction to ventricular filling, due to increased ventricular diastolic pressure or atrial contractile dysfunction. Doppler parameters are consistent with a reversible restrictive pattern, indicative of decreased left ventricular diastolic compliance and/or increased left atrial pressure (grade 3 diastolic dysfunction). Doppler parameters are consistent with both elevated ventricular end-diastolic filling pressure and elevated left atrial filling pressure. Acoustic contrast opacification revealed no evidence ofthrombus. - Mitral valve: Mildly calcified annulus. Mild regurgitation. - Right ventricle: The cavity size was mildly dilated. Systolic pressure was increased. - Tricuspid valve:  Moderate regurgitation directed toward the septum. - Pulmonary arteries: PA peak pressure: 45mm Hg (S).    Medications: I have reviewed the patient's current medications. Scheduled Meds: . allopurinol  300 mg Oral Daily  . amLODipine  5 mg Oral Daily  . aspirin EC  81 mg Oral QHS  . aspirin EC  81 mg Oral Daily  .  atorvastatin  20 mg Oral QHS  . busPIRone  15 mg Oral BID  . cholecalciferol  1,000 Units Oral Daily  . citalopram  20 mg Oral Daily  . fenofibrate  160 mg Oral Daily  . furosemide  40 mg Oral Daily  . levalbuterol  0.63 mg Nebulization TID  . metoprolol tartrate  25 mg Oral BID  . niacin  500 mg Oral QHS  . omega-3 acid ethyl esters  2 g Oral BID  . pantoprazole  40 mg Oral Daily  . pneumococcal 23 valent vaccine  0.5 mL Intramuscular Tomorrow-1000  . potassium chloride  40 mEq Oral BID  . predniSONE  40 mg Oral Q breakfast  . Rivaroxaban  15 mg Oral Daily  . sodium chloride  3 mL Intravenous Q12H   Continuous Infusions:  PRN Meds:.sodium chloride, acetaminophen, levalbuterol, ondansetron (ZOFRAN) IV, sodium chloride, zolpidem  Assessment/Plan: Principal Problem:   Amiodarone pulmonary toxicity Active Problems:   Atrial fibrillation with RVR- DCCV 04/19/13 to NSR   Chronic anticoagulation   Acute systolic congestive heart failure, NYHA class 4   S/P CABG x 3 2005. Patent grafts 04/2013   Nonischemic cardiomyopathy, ef 30-40% by echo 03/2013 down from 49%, was in atrial fib  at time of Echo   CKD (chronic kidney disease) stage 3, GFR 30-59 ml/min, recently was CKD stage 4 with diuressing.   Hypoxia   Hypoxemia   Acute pulmonary edema  PLAN: CKD is actually 4 now, but cr. Stable since admit.  Breathing improved with steroids IV and now po along with lasix. Pt decreased his oxygen to 3 L.   Pro bnp is down from 11,110 to 1,559 with only 1.5 L of fluid loss and 5 lbs.    BP mildly elevated. Maintaining SR.  If back into afib would pursue a fib ablation.  Amiodarone has been stopped and IV lasix changed to po.  Free T4 normal and free T3 normal-monitor TSH now off amiodarone. Respiratory virus panel negative.   Echo slightly improved EF on echo Anticoagulation with Xarelto.  LOS: 3 days   Time spent with pt. :15 minutes. [facility] R  Nurse Practitioner Certified Pager  [phone] or after 5pm and on weekends call [phone] 06/13/2013, 8:36 AM   .I have seen and examined the patient along with Nada Boozer, NP.  I have reviewed the chart, notes and new data.  I agree with NP's note.  Key new complaints: feels better, but still requires O2 supplements. He really wants to go home Key examination changes: clear lungs Key new findings / data: net fluid balance is even, creat stable/slightly better  PLAN: Arrange home O2. DC home once this is arranged One more dose of IV diuretic this AM. DC home on furosemide 80 mg daily. BMET and office f/u early next week. CXR PA and lateral next week. Per Dr. Reginia Naas note prednisone taper by 10 mg q 3 days & stay at 20 mg until FU appt with PCCM in 2 weeks  Thurmon Fair, MD, [facility] [phone] 06/13/2013, 11:08 AM

## 2013-06-13 NOTE — Progress Notes (Signed)
SATURATION QUALIFICATIONS: (This note is used to comply with regulatory documentation for home oxygen)  Patient Saturations on Room Air at Rest = 85%  Patient Saturations on Room Air while Ambulating = 82%  Patient Saturations on 4 Liters of oxygen while Ambulating = 86%  Please briefly explain why patient needs home oxygen:

## 2013-06-14 ENCOUNTER — Telehealth: Payer: Self-pay | Admitting: Internal Medicine

## 2013-06-14 MED ORDER — PREDNISONE 20 MG PO TABS: 40.0000 mg | ORAL_TABLET | Freq: Every day | ORAL | Status: DC

## 2013-06-14 NOTE — Telephone Encounter (Signed)
Returned call and pt verified x 2.  Pt stated Vernona Rieger discharged him last night.  Stated the prednisone Rx didn't get to the pharmacy.  RN reviewed chart and Rx was printed r/t long directions.  Pt informed Nada Boozer, NP will be notified to print and sign Rx.    Pt also wants to know if he should still be taking hydralazine.  Stated he was taking it 3 times a day and now none.  RN reviewed chart and it appears hydralazine was dc'd on 11.7.14 at OV w/ Dr. Rennis Golden.  RN unable to locate reason for d/c in plan at that OV.  Pt informed NP Annie Paras will be notified to review that as well since Dr. Rennis Golden is out of the office today.  Pt verbalized understanding and agreed w/ plan.  Stated he last filled it on 11.22.14.  Message forwarded to L. Annie Paras, NP for further instructions.

## 2013-06-14 NOTE — Telephone Encounter (Signed)
Returned call.  RN asked pt of previous dose of hydralazine.  Pt stated he was taking 50 mg q8h.  Pt informed he should take 1/2 tab or 25 mg every 8 hours and on Monday BP will be checked to determine if increase needed.  Pt verbalized understanding and agreed w/ plan.  Pt also stated he received a call from the pharmacy that rx was sent in and he will get it.

## 2013-06-14 NOTE — Telephone Encounter (Signed)
No we could not send electronically and I gave it to him myself the prescription while he was waiting for the oxygen.

## 2013-06-14 NOTE — Telephone Encounter (Signed)
Was discharge from hospital on last night by Vernona Rieger and have some questions about the prescriptions that she gave him .Marland Kitchen Pease call    Thanks

## 2013-06-14 NOTE — Telephone Encounter (Signed)
Returned call and informed pt per NP.  Pt stated he could not find an Rx.  Pt looked through papers again and still could not find Rx.  Pt informed Rx will be printed and faxed.  Pt also informed per NP that Dr. Rennis Golden is being notified r/t hydralazine and he will be contacted when response given. Pt verbalized understanding and agreed w/ plan.

## 2013-06-14 NOTE — Telephone Encounter (Signed)
If he cannot find we can call in, but due to tapering dose they wanted it printed.  I gave him the prescription while he was waiting for the oxygen.

## 2013-06-14 NOTE — Telephone Encounter (Signed)
OK Dr. Rennis Golden said to put back on - was he taking 50 mg every 8 hours?  If so only take half tab every 8 hours 25 mg,  Since he is on higher dose of Lasix.  Then depending on BP Monday we may increase.

## 2013-06-17 ENCOUNTER — Encounter: Payer: Self-pay | Admitting: Cardiology

## 2013-06-17 ENCOUNTER — Ambulatory Visit
Admission: RE | Admit: 2013-06-17 | Discharge: 2013-06-17 | Disposition: A | Discharge status: Home / Self Care | Payer: Managed Care, Other (non HMO) | Source: Ambulatory Visit | Attending: Cardiology | Admitting: Cardiology

## 2013-06-17 ENCOUNTER — Ambulatory Visit (INDEPENDENT_AMBULATORY_CARE_PROVIDER_SITE_OTHER): Payer: Managed Care, Other (non HMO) | Admitting: Cardiology

## 2013-06-17 DIAGNOSIS — Z5189 Encounter for other specified aftercare: Secondary | ICD-10-CM

## 2013-06-17 DIAGNOSIS — I5021 Acute systolic (congestive) heart failure: Secondary | ICD-10-CM

## 2013-06-17 DIAGNOSIS — I5023 Acute on chronic systolic (congestive) heart failure: Secondary | ICD-10-CM

## 2013-06-17 DIAGNOSIS — R0902 Hypoxemia: Secondary | ICD-10-CM

## 2013-06-17 DIAGNOSIS — R0602 Shortness of breath: Secondary | ICD-10-CM

## 2013-06-17 DIAGNOSIS — I509 Heart failure, unspecified: Secondary | ICD-10-CM

## 2013-06-17 DIAGNOSIS — I4891 Unspecified atrial fibrillation: Secondary | ICD-10-CM

## 2013-06-17 LAB — BASIC METABOLIC PANEL WITH GFR
CO2: 21 mEq/L (ref 19–32)
Calcium: 9.4 mg/dL (ref 8.4–10.5)
Creat: 2.49 mg/dL — ABNORMAL HIGH (ref 0.50–1.35)
Glucose, Bld: 124 mg/dL — ABNORMAL HIGH (ref 70–99)

## 2013-06-17 NOTE — Assessment & Plan Note (Addendum)
Amiodarone has been stopped and he is now followed by pulmonary and is on tapering steroid dosing. Follow up CXR today

## 2013-06-17 NOTE — Assessment & Plan Note (Signed)
Now on oxygen 3-4 l daily

## 2013-06-17 NOTE — Progress Notes (Signed)
06/17/2013   PCP: Carollee Herter, MD   Chief Complaint  Patient presents with  . Follow-up    S/P hospital visit    Primary Cardiologist: Dr. 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 He also has hyperlipidemia and hypertriglyceridemia with triglycerides of over 3000 in the past, morbid obesity, OSA on CPAP and recently atrial fibrillation. Most recent labs showed triglycerides of 306, HDL of 36, VLDL 61 and LDL of 89. He had an outpatient cardiac catheterization on Nov 18, 2010 that showed the grafts were widely patent. Because of a solitary kidney he did not have an LVgram. His last creatinine October 11, 2012 was 1.58. The BUN was 31. The patient was seen on October 10, 2012 and was found to be in atrial fibrillation. He was scheduled for repeat cardioversion and had been placed on Xarelto at that time. Dr. Rennis Golden did not feel it was safe without general anesthesia to do the TEE due to his history of obstructive sleep apnea and relatively thick neck. The patient also had interrupted his Xarelto for 1 day due to gingival bleeding. He did undergo successful cardioversion to sinus rhythm after several weeks of anticoagulation and reported that he felt better. Subsequently he had an echocardiogram which showed a newly reduced ejection fraction of 35%. This is in the setting of worsening shortness of breath. Dr. Rennis Golden recently started him on low-dose Lasix however his creatinine was rising above 3. He has never seen a nephrologist. As above, he is known to have a solitary kidney. 10/16 he returned for follow up and reported markedly increased shortness of breath without much improvement with diuresis. It is noted that he is in atrial fibrillation with rapid ventricular response. I suspect either this or coronary bypass graft dysfunction may be  contributing to his heart failure. He was noted to be in atrial fib on echo in Sept 2014. EF 30-40%, mild to mod.MR, OA ok pressure 41 mmHg. Pt admitted to [facility]. RHC was completed but LHC held due to rising Cr. Lexiscan myoview completed. Also DCCV to SR. With abnormal myoview pt underwent LHC Finding patent grafts. LVEDP 29 mmHg. Renal also followed.  He was recently seen in the office with recurrent atrial fibrillation and rapid ventricular response. Dr. Rennis Golden increased his metoprolol to 25 mg twice daily and referred him to Dr. Johney Frame for evaluation of possible antiarrhythmic therapy versus atrial fibrillation ablation. [name] [name] had limited options for antiarrhythmic therapy, but he had not failed treatment to this point. Dr. Johney Frame recommended starting amiodarone and he has been taking 400 mg twice daily for a loading dose. He reports over the past several days that he has had marked increase in shortness of breath and in fact over the weekend was not able to do much in the way of activity and essentially slept all day. He has a pulse oximeter at home which are showing rest oxygen saturations in the mid-80s. He has not had significant weight gain and, in fact his weight is actually 3 pounds lighter than his last office visit. His EKG on admit did show sinus rhythm at 77 with a left bundle branch block and PVCs. He was able to walk 2 laps around the office but then was completely exhausted with oxygen saturations in the low to mid  80s. He was placed on oxygen in the office with a quick improvement in his symptoms. Dr. Rennis Golden admitted for Heart failure and possible amiodarone toxicity. CCM/Pulmonary consult was obtained. CT of chest was completed and IV steroids started. IV diuretics were also started. Pt began to improve with both steroids and diuresing. He has been maintaining SR. TSH was elevated but normal freeT3 & T4. Will monitor now that Amiodarone has been stopped.   Pt continues to improve with a drop  in oxygen with ambulation and at rest on Room Air. Room Air at rest 85%, RA ambulation 82%, 4 L [place] with ambulation 86%. He now uses home oxygen and oxygen in his cpap at night.   Today he feels much better.  SOB has improved.  He is now on the 30 mg of steroids daily.  He will follow up with Pulmonary next week.  Allergies  Allergen Reactions  . Amiodarone Shortness Of Breath    Pulmonary toxicity    Current Outpatient Prescriptions  Medication Sig Dispense Refill  . acetaminophen (TYLENOL) 500 MG tablet Take 1,000 mg by mouth every 6 (six) hours as needed for pain.      Marland Kitchen allopurinol (ZYLOPRIM) 300 MG tablet take 1 tablet by mouth once daily  30 tablet  12  . amLODipine (NORVASC) 5 MG tablet Take 5 mg by mouth daily.      Marland Kitchen aspirin EC 81 MG tablet Take 81 mg by mouth at bedtime.      Marland Kitchen atorvastatin (LIPITOR) 20 MG tablet Take 1 tablet (20 mg total) by mouth at bedtime.  30 tablet  6  . busPIRone (BUSPAR) 15 MG tablet Take 15 mg by mouth 2 (two) times daily. 10am and 5pm      . cholecalciferol (VITAMIN D) 1000 UNITS tablet Take 1,000 Units by mouth daily.      . Choline Fenofibrate (TRILIPIX) 135 MG capsule Take 135 mg by mouth daily.      . citalopram (CELEXA) 20 MG tablet Take 20 mg by mouth daily.      Marland Kitchen FORTESTA 10 MG/ACT (2%) GEL apply 4 PUMPS once daily to SKIN  60 g  5  . furosemide (LASIX) 40 MG tablet Take 2 tablets (80 mg total) by mouth daily.  60 tablet  6  . glucose blood test strip Use as instructed(THIS IS FOR THE ONETOUCH VERIO IQ )  100 each  PRN  . hydrALAZINE (APRESOLINE) 50 MG tablet Take 50 mg by mouth 3 (three) times daily. Take 1/2 tab tid      . Lansoprazole (PREVACID PO) Take 1 capsule by mouth at bedtime.      . levalbuterol (XOPENEX HFA) 45 MCG/ACT inhaler Inhale 1-2 puffs into the lungs every 4 (four) hours as needed for wheezing.  1 Inhaler  12  . metoprolol tartrate (LOPRESSOR) 25 MG tablet Take 25 mg by mouth 2 (two) times daily.      . niacin 500 MG CR  capsule Take 1 capsule (500 mg total) by mouth at bedtime.  30 capsule  6  . omega-3 acid ethyl esters (LOVAZA) 1 G capsule Take 2 g by mouth 2 (two) times daily.      [name] [name] DELICA LANCETS FINE MISC 1 each by Does not apply route 2 (two) times daily.  100 each  PRN  . potassium chloride SA (K-DUR,KLOR-CON) 20 MEQ tablet Take 2 tablets (40 mEq total) by mouth daily.  60 tablet  6  . predniSONE (DELTASONE) 20  MG tablet Take 2 tablets (40 mg total) by mouth daily with breakfast. Take 40 mg (2 tablets of 20 mg)  12/12; 12/13; 12/14 with BK. Then decrease to 1.5 tablets (30 mg) for 12/15; 12/16; 12/17 with BK Then decrease to 1 tablet daily, (20 mg) and continue at this dose.  41 tablet  1  . Rivaroxaban (XARELTO) 15 MG TABS tablet Take 15 mg by mouth daily.       No current facility-administered medications for this visit.    Past Medical History  Diagnosis Date  . Hypertension   . Obesity     with a BMI of 35  . Gout   . GERD (gastroesophageal reflux disease)   . Depression   . ASHD (arteriosclerotic heart disease)     hx CABG 2005, patent grafts 04/2013  . Diabetes mellitus   . Hyperlipidemia   . Hypertriglyceridemia     TRIGS OVER 3000 IN THE PAST; MOST RECENT TRIGS OF 306, HDL 36, VLDL 61, LDL 89  . 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 History of tobacco abuse     PT QUIT 07/04/12  . OSA on CPAP   . Shortness of breath   . Nonischemic cardiomyopathy, ef 30-40% by echo 03/2013 down from 49%, was in atrial fib  at time of Echo 05/01/2013  . CKD (chronic kidney disease) stage 3, GFR 30-59 ml/min, recently was CKD stage 4 with diuressing. 05/01/2013  . Carotid disease, bilateral   . Persistent atrial fibrillation 04/18/2013    DCCV 11/2012; 04/2013  . Chronic anticoagulation 04/18/2013    xarelto  . Amiodarone pulmonary toxicity     Past Surgical History  Procedure Laterality Date  . Appendectomy  1983  . Cardioversion N/A 11/14/2012    Procedure:  CARDIOVERSION;  Surgeon: [name], MD;  Location: [facility] ENDOSCOPY;  Service: Cardiovascular;  Laterality: N/A;  . Coronary artery bypass graft  05/11/04    X 4; HAS A VERY SHORT AORTA, ROUND DILATED HEART AS WELL AS A STERNAL ABNORMALITY   . Cardiac catheterization  11/18/10    GRAFTS WIDELY PATENT WITH NO HIGH-GRADE CAD  . Carotids  04/03/12    CAROTID DUPLEX; RIGHT BULB/PROXIMAL ICA: MILD AMT OF FIBROUS PLAQUE SLIGHTLY ELEVATING VELOCITIES WITHIN THE PROXIMAL SEGMENT OF THE INTERNAL CAROTID  ARTERY  . Nm myoview ltd  09/17/09    EF 49%; GLOBAL LV SYSTOLIC FUNCTION MILDLY REDUCED  . Cardiac catheterization  04/2013    patent grafts, also RHC done    ZOX:WRUEAVW:UJ colds or fevers,  weight decreased by 12 pounds, Skin:no rashes or ulcers HEENT:no blurred vision, no congestion CV:see HPI PUL:see HPI GI:no diarrhea constipation or melena, no indigestion GU:no hematuria, no dysuria MS:no joint pain, no claudication Neuro:no syncope, no lightheadedness Endo:borderline diabetes has been diet controlled but with steroids was running higher in the hospital, no thyroid disease  PHYSICAL EXAM BP 124/68  Pulse 72  Ht 5\' 8"  (1.727 m)  Wt 230 lb (104.327 kg)  BMI 34.98 kg/m2 General:Pleasant affect, NAD Skin:Warm and dry, brisk capillary refill HEENT:normocephalic, sclera clear, mucus membranes moist Neck:supple, no JVD sitting upright,  Heart:S1S2 RRR without murmur, gallup, rub or click Lungs:clear without rales, rhonchi, or wheezes WJX:BJYN, non tender, + BS, do not palpate liver spleen or masses Ext:no lower ext edema, 2+ radial pulses Neuro:alert and oriented, MAE, follows commands, + facial symmetry  Walked monitoring SP02  On oxygen,  Ranged 91-96% improved from 84%  in hospital.. He feels better.  ASSESSMENT AND PLAN Amiodarone pulmonary toxicity Amiodarone has been stopped and he is now followed by pulmonary and is on tapering steroid dosing. Follow up CXR today  Acute  systolic congestive heart failure, NYHA class 4 Acute on chronic systolic HF was also playing a part in pt's resp. Distress.  He was diuresed and is now 12 pounds less.  On 80 mg lasix daily.  Will recheck BMP and Pro-bnp  Hypoxemia Now on oxygen 3-4 l daily  Atrial fibrillation with RVR- DCCV 04/19/13 to NSR Maintaining SR.  Will need follow up with Dr. Johney Frame once lungs improved for ablation.

## 2013-06-17 NOTE — Patient Instructions (Addendum)
Have CXR done today.  Have blood work done today.  Follow up with Dr. Rennis Golden in 2 weeks.  Weigh daily Call [phone] if weight climbs more than 3 pounds in a day or 5 pounds in a week. No salt to very little salt in your diet.  No more than 2000 mg in a day. Call if increased shortness of breath or increased swelling.

## 2013-06-17 NOTE — Assessment & Plan Note (Signed)
Maintaining SR.  Will need follow up with Dr. Johney Frame once lungs improved for ablation.

## 2013-06-17 NOTE — Assessment & Plan Note (Addendum)
Acute on chronic systolic HF was also playing a part in pt's resp. Distress.  He was diuresed and is now 12 pounds less.  On 80 mg lasix daily.  Will recheck BMP and Pro-bnp

## 2013-06-24 ENCOUNTER — Other Ambulatory Visit (INDEPENDENT_AMBULATORY_CARE_PROVIDER_SITE_OTHER): Payer: Managed Care, Other (non HMO)

## 2013-06-24 ENCOUNTER — Ambulatory Visit (INDEPENDENT_AMBULATORY_CARE_PROVIDER_SITE_OTHER)
Admission: RE | Admit: 2013-06-24 | Discharge: 2013-06-24 | Disposition: A | Discharge status: Home / Self Care | Payer: Managed Care, Other (non HMO) | Source: Ambulatory Visit | Attending: Internal Medicine | Admitting: Internal Medicine

## 2013-06-24 ENCOUNTER — Encounter: Payer: Self-pay | Admitting: Internal Medicine

## 2013-06-24 ENCOUNTER — Ambulatory Visit (INDEPENDENT_AMBULATORY_CARE_PROVIDER_SITE_OTHER): Payer: Managed Care, Other (non HMO) | Admitting: Internal Medicine

## 2013-06-24 DIAGNOSIS — J849 Interstitial pulmonary disease, unspecified: Secondary | ICD-10-CM

## 2013-06-24 DIAGNOSIS — Z5189 Encounter for other specified aftercare: Secondary | ICD-10-CM

## 2013-06-24 DIAGNOSIS — J8410 Pulmonary fibrosis, unspecified: Secondary | ICD-10-CM

## 2013-06-24 DIAGNOSIS — R0902 Hypoxemia: Secondary | ICD-10-CM

## 2013-06-24 NOTE — Progress Notes (Signed)
   Subjective:    Patient ID: [name] [name], male    DOB: [birth year], 57 y.o.   MRN: [id number]  HPI  OV 06/24/2013  Hospital followup  Appar3ently admitted oct 2014 for A Fib. 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 Concern for acute amio tox. No autoimmune workup. STrted on empiic steroids with dc amio, Currently feels 75% better in terms of dyspnea. Tolerating prednisone okay @ 20mg  per day. No new complaitns.  Walkt test today 185 feet x 1 lap on RA: desat to 86% (bettter than while in hopsitla he says)    Review of Systems  Constitutional: Negative for fever and unexpected weight change.  HENT: Negative for congestion, dental problem, ear pain, nosebleeds, postnasal drip, rhinorrhea, sinus pressure, sneezing, sore throat and trouble swallowing.   Eyes: Negative for redness and itching.  Respiratory: Positive for chest tightness and shortness of breath. Negative for cough and wheezing.   Cardiovascular: Negative for palpitations and leg swelling.  Gastrointestinal: Negative for nausea and vomiting.  Genitourinary: Negative for dysuria.  Musculoskeletal: Negative for joint swelling.  Skin: Negative for rash.  Neurological: Negative for headaches.  Hematological: Does not bruise/bleed easily.  Psychiatric/Behavioral: Negative for dysphoric mood. The patient is not nervous/anxious.        Objective:   Physical Exam  Nursing note and vitals reviewed. Constitutional: He is oriented to person, place, and time. He appears well-developed and well-nourished. No distress.  Obese Body mass index is 35.8 kg/(m^2).   HENT:  Head: Normocephalic and atraumatic.  Right Ear: External ear normal.  Left Ear: External ear normal.  Mouth/Throat: Oropharynx is clear and moist. No oropharyngeal exudate.  Large neck mallampatti class 4  Eyes: Conjunctivae  and EOM are normal. Pupils are equal, round, and reactive to light. Right eye exhibits no discharge. Left eye exhibits no discharge. No scleral icterus.  Neck: Normal range of motion. Neck supple. No JVD present. No tracheal deviation present. No thyromegaly present.  Cardiovascular: Normal rate, regular rhythm and intact distal pulses.  Exam reveals no gallop and no friction rub.   No murmur heard. Pulmonary/Chest: Effort normal and breath sounds normal. No respiratory distress. He has no wheezes. He has no rales. He exhibits no tenderness.  Abdominal: Soft. Bowel sounds are normal. He exhibits no distension and no mass. There is no tenderness. There is no rebound and no guarding.  Musculoskeletal: Normal range of motion. He exhibits no edema and no tenderness.  Lymphadenopathy:    He has no cervical adenopathy.  Neurological: He is alert and oriented to person, place, and time. He has normal reflexes. No cranial nerve deficit. Coordination normal.  Skin: Skin is warm and dry. No rash noted. He is not diaphoretic. No erythema. No pallor.  Psychiatric: He has a normal mood and affect. His behavior is normal. Judgment and thought content normal.          Assessment & Plan:

## 2013-06-24 NOTE — Patient Instructions (Signed)
Please do CXR today; depending on result will adjust prednisone Do Serum: ESR, ANA,  RF, anti-CCP, ssA, ssB, scl-70, ANCA, Total CK,  Aldolase,  Will call you with results

## 2013-06-25 ENCOUNTER — Telehealth: Payer: Self-pay | Admitting: Internal Medicine

## 2013-06-25 LAB — CK TOTAL AND CKMB (NOT AT ARMC)
CK, MB: 3.3 ng/mL (ref 0.0–5.0)
Total CK: 59 U/L (ref 7–232)

## 2013-06-25 LAB — SJOGRENS SYNDROME-B EXTRACTABLE NUCLEAR ANTIBODY: SSB (La) (ENA) Antibody, IgG: <1 AU/mL (ref <30)

## 2013-06-25 LAB — SJOGRENS SYNDROME-A EXTRACTABLE NUCLEAR ANTIBODY: SSA (Ro) (ENA) Antibody, IgG: 3 AU/mL (ref <30)

## 2013-06-25 LAB — RHEUMATOID FACTOR: Rhuematoid fact SerPl-aCnc: <10 IU/mL (ref <=14)

## 2013-06-25 NOTE — Telephone Encounter (Signed)
Triage/JC - for attention today  cxr improved from time of admission but no change from 06/17/13. Blood test so far autoimmune negative but sed rate still high suggesting still continuing inflammati in lung. I would like him to increase his prednisone to 40mg  once daily (monitor his sugars) and see me/TP in 2 weeks for walking desat test and cxr   Thanks Dr. Kalman Shan, M.D., [facility].C.P Pulmonary and Critical Care Medicine Staff Physician [facility] [place] [facility] Pager: [phone], If no answer or between  15:00h - 7:00h: call [phone]  06/25/2013 10:49 AM         Dg Chest 2 View  06/24/2013   CLINICAL DATA:  Shortness of breath  EXAM: CHEST  2 VIEW  COMPARISON:  06/17/2013  FINDINGS: Cardiac shadow is stable but enlarged. Postsurgical changes are again seen. Interstitial changes are again noted consistent with mild vascular congestion. No focal confluent infiltrate is seen. No sizable effusion is noted.  IMPRESSION: No change from the prior exam.   Electronically Signed   By: Alcide Clever M.D.   On: 06/24/2013 17:42

## 2013-06-25 NOTE — Telephone Encounter (Signed)
[email or web address].Jennifer Castillo, CMA  

## 2013-06-25 NOTE — Telephone Encounter (Signed)
Pt advised and appt set for 07-09-12. Carron Curie, CMA

## 2013-06-27 LAB — ALDOLASE: Aldolase: 5.2 U/L (ref <=8.1)

## 2013-06-27 NOTE — Assessment & Plan Note (Signed)
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 Clniically better but not fully. Will do cxr and autoimmune labs. If XR still shows pulm infiltrates will need to bump up steroids  (> 50% of this 15 min visit spent in face to face counseling)

## 2013-07-01 ENCOUNTER — Ambulatory Visit: Payer: Managed Care, Other (non HMO) | Admitting: Internal Medicine

## 2013-07-01 ENCOUNTER — Other Ambulatory Visit: Payer: Self-pay | Admitting: Family Medicine

## 2013-07-01 NOTE — Telephone Encounter (Signed)
IS THIS OKAY TO REFILL 

## 2013-07-03 ENCOUNTER — Ambulatory Visit: Payer: Managed Care, Other (non HMO) | Admitting: Internal Medicine

## 2013-07-09 ENCOUNTER — Encounter: Payer: Self-pay | Admitting: Internal Medicine

## 2013-07-09 ENCOUNTER — Ambulatory Visit (INDEPENDENT_AMBULATORY_CARE_PROVIDER_SITE_OTHER)
Admission: RE | Admit: 2013-07-09 | Discharge: 2013-07-09 | Disposition: A | Discharge status: Home / Self Care | Payer: Managed Care, Other (non HMO) | Source: Ambulatory Visit | Attending: Internal Medicine | Admitting: Internal Medicine

## 2013-07-09 ENCOUNTER — Ambulatory Visit (INDEPENDENT_AMBULATORY_CARE_PROVIDER_SITE_OTHER): Payer: Managed Care, Other (non HMO) | Admitting: Internal Medicine

## 2013-07-09 VITALS — BP 132/66 | HR 113 | Ht 68.0 in | Wt 239.3 lb

## 2013-07-09 DIAGNOSIS — R0602 Shortness of breath: Secondary | ICD-10-CM

## 2013-07-09 DIAGNOSIS — R0902 Hypoxemia: Secondary | ICD-10-CM

## 2013-07-09 DIAGNOSIS — N289 Disorder of kidney and ureter, unspecified: Secondary | ICD-10-CM

## 2013-07-09 DIAGNOSIS — I428 Other cardiomyopathies: Secondary | ICD-10-CM

## 2013-07-09 DIAGNOSIS — I4891 Unspecified atrial fibrillation: Secondary | ICD-10-CM

## 2013-07-09 DIAGNOSIS — Z5189 Encounter for other specified aftercare: Secondary | ICD-10-CM

## 2013-07-09 DIAGNOSIS — I509 Heart failure, unspecified: Secondary | ICD-10-CM

## 2013-07-09 DIAGNOSIS — J810 Acute pulmonary edema: Secondary | ICD-10-CM

## 2013-07-09 DIAGNOSIS — I4819 Other persistent atrial fibrillation: Secondary | ICD-10-CM

## 2013-07-09 DIAGNOSIS — Z79899 Other long term (current) drug therapy: Secondary | ICD-10-CM

## 2013-07-09 DIAGNOSIS — I5021 Acute systolic (congestive) heart failure: Secondary | ICD-10-CM

## 2013-07-09 DIAGNOSIS — N189 Chronic kidney disease, unspecified: Secondary | ICD-10-CM

## 2013-07-09 DIAGNOSIS — Z951 Presence of aortocoronary bypass graft: Secondary | ICD-10-CM

## 2013-07-09 NOTE — Assessment & Plan Note (Signed)
#  Shortness of breath  - due to amio lung tox that is improving but also due to obesity related lung atelectasis and A Fib - you are some improved from visit 06/25/13 -- cut down prednisone to 30mg  per day  - ensure followup with cardiology  - agree with application for short term disability - continue oxygen but use finger pulse ox; keep it > 88%  #SLeep apnea  - refer to one of the sleep docs in our office for improved sleep apnea control that could help your A Fib  #Followup 3 weeks with me   > 50% of this > 25 min visit spent in face to face counseling (15 min visit converted to 25 min)

## 2013-07-09 NOTE — Patient Instructions (Addendum)
Take 60mg  (1.5 tablets) Lasix in the morning, 40mg  (1 tablet) Lasix in the PM  Please have blood work on Friday.  Please schedule a follow up appointment with Dr. Rennis Golden for 1 week.

## 2013-07-09 NOTE — Progress Notes (Signed)
Subjective:    Patient ID: [name] [name], male    DOB: 1956-06-23, 58 y.o.   MRN: [id number]  HPI  OV 06/24/2013  Hospital followup  Appar3ently admitted oct 2014 for A Fib. 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 Concern for acute amio tox. No autoimmune workup. STrted on empiic steroids with dc amio, Currently feels 75% better in terms of dyspnea. Tolerating prednisone okay @ 279mper day. No new complaitns.  Walkt test today 185 feet x 1 lap on RA: desat to 86% (this is bettter than while in hopsitla he says)  REC  Please do CXR today; depending on result will adjust prednisone Do Serum: ESR, ANA,  RF, anti-CCP, ssA, ssB, scl-70, ANCA, Total CK,  Aldolase,  Will call you with results   Telephone call 06/25/13  cxr improved from time of admission but no change from 06/17/13. Blood test so far autoimmune negative but sed rate still high suggesting still continuing inflammati in lung. I would like him to increase his prednisone to 4042mnce daily (monitor his sugars) and see me/TP in 2 weeks for walking desat test and cxr    OV 07/09/2013  Chief Complaint  Patient presents with  . Follow-up    pt states breathing is the same. Pt is having increased dry cough.     FU suspected acute amio tx  On 06/25/13 amio increased to 64m73mr day due to desat with exertion and high esr though he was partially improved. Now on fu he says he is no better. IF he goes all day without o2 he is exhaused though pulse ox does not drop below 94%. STill with dyspnea and cough unchanged; cough is dry.He has been gaining weight due to prednisone  Walk test 185 feet on RA x 3 laps: he dropped to 88% at 2nd lap and HR was 150 and got dyspneic. WE walkd him more and his pulse ox stayed 89% or so and did not drop furtther but HR wsa 150 and this seemed the bigger  problem,  CXR - appears improved from admit but similar to 06/25/13 Dg Chest 2 View  07/09/2013   CLINICAL DATA:  Shortness of breath  EXAM: CHEST  2 VIEW  COMPARISON:  06/24/2013  FINDINGS: Cardiac shadow is enlarged. Postsurgical changes are again noted. The lungs are well aerated. Mild pulmonary vascular congestion remains. No focal infiltrate is same.  IMPRESSION: Mild vascular congestion.  No acute abnormality is noted.   Electronically Signed   By: MarkInez Catalina.   On: 07/09/2013 16:25     Review of Systems  Constitutional: Negative for fever and unexpected weight change.  HENT: Negative for congestion, dental problem, ear pain, nosebleeds, postnasal drip, rhinorrhea, sinus pressure, sneezing, sore throat and trouble swallowing.   Eyes: Negative for redness and itching.  Respiratory: Positive for cough and shortness of breath. Negative for chest tightness and wheezing.   Cardiovascular: Negative for palpitations and leg swelling.  Gastrointestinal: Negative for nausea and vomiting.  Genitourinary: Negative for dysuria.  Musculoskeletal: Negative for joint swelling.  Skin: Negative for rash.  Neurological: Negative for headaches.  Hematological: Does not bruise/bleed easily.  Psychiatric/Behavioral: Negative for dysphoric mood. The patient is not nervous/anxious.    Current outpatient prescriptions:acetaminophen (TYLENOL) 500 MG tablet, Take 1,000 mg by mouth every 6 (six) hours as needed for pain., Disp: ,  Rfl: ;  allopurinol (ZYLOPRIM) 300 MG tablet, take 1 tablet by mouth once daily, Disp: 30 tablet, Rfl: 12;  amLODipine (NORVASC) 5 MG tablet, Take 5 mg by mouth daily., Disp: , Rfl: ;  aspirin EC 81 MG tablet, Take 81 mg by mouth at bedtime., Disp: , Rfl:  atorvastatin (LIPITOR) 20 MG tablet, Take 1 tablet (20 mg total) by mouth at bedtime., Disp: 30 tablet, Rfl: 6;  busPIRone (BUSPAR) 15 MG tablet, take 1 tablet by mouth twice a day AT 10AM AND 5PM, Disp: 60 tablet, Rfl: 5;   cholecalciferol (VITAMIN D) 1000 UNITS tablet, Take 1,000 Units by mouth daily., Disp: , Rfl: ;  Choline Fenofibrate (TRILIPIX) 135 MG capsule, Take 135 mg by mouth daily., Disp: , Rfl:  citalopram (CELEXA) 20 MG tablet, Take 20 mg by mouth daily., Disp: , Rfl: ;  FORTESTA 10 MG/ACT (2%) GEL, apply 4 PUMPS once daily to SKIN, Disp: 60 g, Rfl: 5;  furosemide (LASIX) 40 MG tablet, Take 40-60 mg by mouth daily. Take 3m AM and take 479mPM, Disp: , Rfl: ;  glucose blood test strip, Use as instructed(THIS IS FOR THE ONETOUCH VERIO IQ ), Disp: 100 each, Rfl: PRN hydrALAZINE (APRESOLINE) 50 MG tablet, Take 25 mg by mouth 3 (three) times daily. Take 1/2 tab tid, Disp: , Rfl: ;  Lansoprazole (PREVACID PO), Take 1 capsule by mouth at bedtime., Disp: , Rfl: ;  levalbuterol (XOPENEX HFA) 45 MCG/ACT inhaler, Inhale 1-2 puffs into the lungs every 4 (four) hours as needed for wheezing., Disp: 1 Inhaler, Rfl: 12;  LOVAZA 1 G capsule, take 4 capsules by mouth daily, Disp: 120 capsule, Rfl: 11 metoprolol tartrate (LOPRESSOR) 25 MG tablet, Take 25 mg by mouth 2 (two) times daily., Disp: , Rfl: ;  niacin 500 MG CR capsule, Take 1 capsule (500 mg total) by mouth at bedtime., Disp: 30 capsule, Rfl: 6;  ONETOUCH DELICA LANCETS FINE MISC, 1 each by Does not apply route 2 (two) times daily., Disp: 100 each, Rfl: PRN;  OXYGEN-HELIUM IN, Inhale 3 L into the lungs continuous., Disp: , Rfl:  potassium chloride SA (K-DUR,KLOR-CON) 20 MEQ tablet, Take 2 tablets (40 mEq total) by mouth daily., Disp: 60 tablet, Rfl: 6;  predniSONE (DELTASONE) 20 MG tablet, Take 20 mg by mouth daily with breakfast., Disp: , Rfl: ;  Rivaroxaban (XARELTO) 15 MG TABS tablet, Take 15 mg by mouth daily., Disp: , Rfl:      Objective:   Physical Exam   Nursing note and vitals reviewed. Constitutional: He is oriented to person, place, and time. He appears well-developed and well-nourished. No distress.  Obese Body mass index is 36.74 kg/(m^2).    HENT:   Head: Normocephalic and atraumatic.  Right Ear: External ear normal.  Left Ear: External ear normal.  Mouth/Throat: Oropharynx is clear and moist. No oropharyngeal exudate.  Large neck mallampatti class 4  Eyes: Conjunctivae and EOM are normal. Pupils are equal, round, and reactive to light. Right eye exhibits no discharge. Left eye exhibits no discharge. No scleral icterus.  Neck: Normal range of motion. Neck supple. No JVD present. No tracheal deviation present. No thyromegaly present.  Cardiovascular: Normal rate, regular rhythm and intact distal pulses.  Exam reveals no gallop and no friction rub.   No murmur heard. Pulmonary/Chest: Effort normal and breath sounds normal. No respiratory distress. He has no wheezes. He has no rales. He exhibits no tenderness.  Abdominal: Soft. Bowel sounds are normal. He exhibits no distension and  no mass. There is no tenderness. There is no rebound and no guarding.  Musculoskeletal: Normal range of motion. He exhibits no edema and no tenderness.  Lymphadenopathy:    He has no cervical adenopathy.  Neurological: He is alert and oriented to person, place, and time. He has normal reflexes. No cranial nerve deficit. Coordination normal.  Skin: Skin is warm and dry. No rash noted. He is not diaphoretic. No erythema. No pallor.  Psychiatric: He has a normal mood and affect. His behavior is normal. Judgment and thought content normal.          Assessment & Plan:

## 2013-07-09 NOTE — Patient Instructions (Addendum)
#  Shortness of breath  - due to amio lung tox that is improving but also due to obesity related lung atelectasis and A Fib - you are some improved from visit 06/25/13 -- cut down prednisone to 30mg  per day  - ensure followup with cardiology  - agree with application for short term disability - continue oxygen but use finger pulse ox; keep it > 88%  #SLeep apnea  - refer to one of the sleep docs in our office for improved sleep apnea control that could help your A Fib  #Followup 3 weeks with me

## 2013-07-11 ENCOUNTER — Encounter: Payer: Self-pay | Admitting: Internal Medicine

## 2013-07-11 NOTE — Progress Notes (Signed)
07/11/2013   PCP: Carollee Herter, MD   Chief Complaint  Patient presents with  . Follow-up    3L O2; "doing about the same", denies CP    Primary Cardiologist: Dr. Rennis Golden  HPI: 58 year old WMM with a past medical history significant for coronary artery disease and bypass surgery x 3 vessels in 2005 (LIMA to LAD, SVG to DIAG, and SVG to OM1/OM2). He also has hyperlipidemia and hypertriglyceridemia with triglycerides of over 3000 in the past, morbid obesity, OSA on CPAP and recently atrial fibrillation. Most recent labs showed triglycerides of 306, HDL of 36, VLDL 61 and LDL of 89. He had an outpatient cardiac catheterization on Nov 18, 2010 that showed the grafts were widely patent. Because of a solitary kidney he did not have an LVgram. His last creatinine October 11, 2012 was 1.58. The BUN was 31. The patient was seen on October 10, 2012 and was found to be in atrial fibrillation. He was scheduled for repeat cardioversion and had been placed on Xarelto at that time. Dr. Rennis Golden did not feel it was safe without general anesthesia to do the TEE due to his history of obstructive sleep apnea and relatively thick neck. The patient also had interrupted his Xarelto for 1 day due to gingival bleeding. He did undergo successful cardioversion to sinus rhythm after several weeks of anticoagulation and reported that he felt better. Subsequently he had an echocardiogram which showed a newly reduced ejection fraction of 35%. This is in the setting of worsening shortness of breath. Dr. Rennis Golden recently started him on low-dose Lasix however his creatinine was rising above 3. He has never seen a nephrologist. As above, he is known to have a solitary kidney. 10/16 he returned for follow up and reported markedly increased shortness of breath without much improvement with diuresis. It is noted that he is in atrial fibrillation with rapid ventricular response. I suspect either this or coronary bypass graft dysfunction  may be contributing to his heart failure. He was noted to be in atrial fib on echo in Sept 2014. EF 30-40%, mild to mod.MR, OA ok pressure 41 mmHg. Pt admitted to [facility]. RHC was completed but LHC held due to rising Cr. Lexiscan myoview completed. Also DCCV to SR. With abnormal myoview pt underwent LHC Finding patent grafts. LVEDP 29 mmHg. Renal also followed.  He was recently seen in the office with recurrent atrial fibrillation and rapid ventricular response. Dr. Rennis Golden increased his metoprolol to 25 mg twice daily and referred him to Dr. Johney Frame for evaluation of possible antiarrhythmic therapy versus atrial fibrillation ablation. [name] [name] had limited options for antiarrhythmic therapy, but he had not failed treatment to this point. Dr. Johney Frame recommended starting amiodarone and he has been taking 400 mg twice daily for a loading dose. He reports over the past several days that he has had marked increase in shortness of breath and in fact over the weekend was not able to do much in the way of activity and essentially slept all day. He has a pulse oximeter at home which are showing rest oxygen saturations in the mid-80s. He has not had significant weight gain and, in fact his weight is actually 3 pounds lighter than his last office visit. His EKG on admit did show sinus rhythm at 77 with a left bundle branch block and PVCs. He was able to walk 2 laps around the office but then was completely exhausted with oxygen saturations in the low to mid  80s. He was placed on oxygen in the office with a quick improvement in his symptoms. Dr. Rennis GoldenHilty admitted for Heart failure and possible amiodarone toxicity. CCM/Pulmonary consult was obtained. CT of chest was completed and IV steroids started. IV diuretics were also started. Pt began to improve with both steroids and diuresing. He has been maintaining SR. TSH was elevated but normal freeT3 & T4. Will monitor now that Amiodarone has been stopped.   Pt continues to improve with  a drop in oxygen with ambulation and at rest on Room Air. Room Air at rest 85%, RA ambulation 82%, 4 L [place] with ambulation 86%. He now uses home oxygen and oxygen in his cpap at night.   [name] [name] returns today for followup. He does report somewhat worsening shortness of breath. He recently saw Dr. Marchelle Gearingamaswamy, who recommended an increase in his steroids for amiodarone pulmonary toxicity. He reports this has not helped him a lot and he feels that he is more short of breath. He's also had some weight gain now up to 239 pounds.  Allergies  Allergen Reactions  . Amiodarone Shortness Of Breath    Pulmonary toxicity    Current Outpatient Prescriptions  Medication Sig Dispense Refill  . acetaminophen (TYLENOL) 500 MG tablet Take 1,000 mg by mouth every 6 (six) hours as needed for pain.      Marland Kitchen. allopurinol (ZYLOPRIM) 300 MG tablet take 1 tablet by mouth once daily  30 tablet  12  . amLODipine (NORVASC) 5 MG tablet Take 5 mg by mouth daily.      Marland Kitchen. aspirin EC 81 MG tablet Take 81 mg by mouth at bedtime.      Marland Kitchen. atorvastatin (LIPITOR) 20 MG tablet Take 1 tablet (20 mg total) by mouth at bedtime.  30 tablet  6  . busPIRone (BUSPAR) 15 MG tablet take 1 tablet by mouth twice a day AT 10AM AND 5PM  60 tablet  5  . cholecalciferol (VITAMIN D) 1000 UNITS tablet Take 1,000 Units by mouth daily.      . Choline Fenofibrate (TRILIPIX) 135 MG capsule Take 135 mg by mouth daily.      . citalopram (CELEXA) 20 MG tablet Take 20 mg by mouth daily.      Marland Kitchen. FORTESTA 10 MG/ACT (2%) GEL apply 4 PUMPS once daily to SKIN  60 g  5  . furosemide (LASIX) 40 MG tablet Take 40-60 mg by mouth daily. Take 60mg  AM and take 40mg  PM      . glucose blood test strip Use as instructed(THIS IS FOR THE ONETOUCH VERIO IQ )  100 each  PRN  . hydrALAZINE (APRESOLINE) 50 MG tablet Take 25 mg by mouth 3 (three) times daily. Take 1/2 tab tid      . Lansoprazole (PREVACID PO) Take 1 capsule by mouth at bedtime.      . levalbuterol (XOPENEX HFA) 45  MCG/ACT inhaler Inhale 1-2 puffs into the lungs every 4 (four) hours as needed for wheezing.  1 Inhaler  12  . LOVAZA 1 G capsule take 4 capsules by mouth daily  120 capsule  11  . metoprolol tartrate (LOPRESSOR) 25 MG tablet Take 25 mg by mouth 2 (two) times daily.      . niacin 500 MG CR capsule Take 1 capsule (500 mg total) by mouth at bedtime.  30 capsule  6  . ONETOUCH DELICA LANCETS FINE MISC 1 each by Does not apply route 2 (two) times daily.  100 each  PRN  . OXYGEN-HELIUM IN Inhale 3 L into the lungs continuous.      . potassium chloride SA (K-DUR,KLOR-CON) 20 MEQ tablet Take 2 tablets (40 mEq total) by mouth daily.  60 tablet  6  . predniSONE (DELTASONE) 20 MG tablet Take 20 mg by mouth daily with breakfast.      . Rivaroxaban (XARELTO) 15 MG TABS tablet Take 15 mg by mouth daily.       No current facility-administered medications for this visit.    Past Medical History  Diagnosis Date  . Hypertension   . Obesity     with a BMI of 35  . Gout   . GERD (gastroesophageal reflux disease)   . Depression   . ASHD (arteriosclerotic heart disease)     hx CABG 2005, patent grafts 04/2013  . Diabetes mellitus   . Hyperlipidemia   . Hypertriglyceridemia     TRIGS OVER 3000 IN THE PAST; MOST RECENT TRIGS OF 306, HDL 36, VLDL 61, LDL 89  . 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 History of tobacco abuse     PT QUIT 07/04/12  . OSA on CPAP   . Shortness of breath   . Nonischemic cardiomyopathy, ef 30-40% by echo 03/2013 down from 49%, was in atrial fib  at time of Echo 05/01/2013  . CKD (chronic kidney disease) stage 3, GFR 30-59 ml/min, recently was CKD stage 4 with diuressing. 05/01/2013  . Carotid disease, bilateral   . Persistent atrial fibrillation 04/18/2013    DCCV 11/2012; 04/2013  . Chronic anticoagulation 04/18/2013    xarelto  . Amiodarone pulmonary toxicity     Past Surgical History  Procedure Laterality Date  . Appendectomy  1983  .  Cardioversion N/A 11/14/2012    Procedure: CARDIOVERSION;  Surgeon: [name], MD;  Location: [facility] ENDOSCOPY;  Service: Cardiovascular;  Laterality: N/A;  . Coronary artery bypass graft  05/11/04    X 4; HAS A VERY SHORT AORTA, ROUND DILATED HEART AS WELL AS A STERNAL ABNORMALITY   . Cardiac catheterization  11/18/10    GRAFTS WIDELY PATENT WITH NO HIGH-GRADE CAD  . Carotids  04/03/12    CAROTID DUPLEX; RIGHT BULB/PROXIMAL ICA: MILD AMT OF FIBROUS PLAQUE SLIGHTLY ELEVATING VELOCITIES WITHIN THE PROXIMAL SEGMENT OF THE INTERNAL CAROTID  ARTERY  . Nm myoview ltd  09/17/09    EF 49%; GLOBAL LV SYSTOLIC FUNCTION MILDLY REDUCED  . Cardiac catheterization  04/2013    patent grafts, also RHC done    DJM:EQASTMH:DQ colds or fevers,  weight decreased by 12 pounds, Skin:no rashes or ulcers HEENT:no blurred vision, no congestion CV:see HPI PUL:see HPI GI:no diarrhea constipation or melena, no indigestion GU:no hematuria, no dysuria MS:no joint pain, no claudication Neuro:no syncope, no lightheadedness Endo:borderline diabetes has been diet controlled but with steroids was running higher in the hospital, no thyroid disease  PHYSICAL EXAM BP 132/66  Pulse 113  Ht 5\' 8"  (1.727 m)  Wt 239 lb 4.8 oz (108.546 kg)  BMI 36.39 kg/m2 General:Pleasant affect, NAD Skin:Warm and dry, brisk capillary refill HEENT:normocephalic, sclera clear, mucus membranes moist Neck:supple, no JVD sitting upright,  Heart:S1S2 RRR without murmur, gallup, rub or click Lungs:clear without rales, rhonchi, or wheezes QIW:LNLG, non tender, + BS, do not palpate liver spleen or masses Ext: 1+ LE edema, 2+ radial pulses Neuro:alert and oriented, MAE, follows commands, + facial symmetry  ASSESSMENT AND PLAN Patient Active Problem List   Diagnosis  Date Noted  . Hyperglycemia, with steroids 06/13/2013  . 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 Hypoxia 06/10/2013  . Hypoxemia 06/10/2013  . Acute pulmonary edema  06/10/2013  . Nonischemic cardiomyopathy, ef 30-40% by echo 03/2013 down from 49%, was in atrial fib  at time of Echo 05/01/2013  . CKD (chronic kidney disease) stage 3, GFR 30-59 ml/min, recently was CKD stage 4 with diuressing. 05/01/2013  . Carotid disease, bilateral   . Acute on chronic renal insufficiency 04/21/2013  . Atrial fibrillation with RVR- DCCV 04/19/13 to NSR 04/18/2013  . Chronic anticoagulation 04/18/2013  . Acute systolic congestive heart failure, NYHA class 4 04/18/2013  . S/P CABG x 3 2005. Patent grafts 04/2013 04/18/2013  . Obesity: BMI 36.8 11/27/2012  . Persistent atrial fibrillation 10/15/2012  . Type II or unspecified type diabetes mellitus without mention of complication, not stated as uncontrolled 07/07/2011  . Hypogonadism male 06/23/2011  . Hypertension associated with diabetes 11/25/2010  . Hyperlipidemia LDL goal <70 11/25/2010  . Sleep apnea- on C-pap 11/25/2010  . Depression 11/25/2010  . Atrophy of left kidney, solitary kidney 11/25/2010  . 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  [name] [name] is again having worsening short of breath.  I suspect that there is some superimposed heart failure, possibly related to the steroid increases or persistent atrial fibrillation.  He is scheduled to see his pulmonologist later today. I have recommended increasing his Lasix to 60 mg every morning and 40 mg every afternoon.  Hopefully the combination of increased diuresis and increased steroids can help turn them around.  Now that he has failed his only options for antiarrhythmic therapy, I do feel that his only option to treat atrial fibrillation would be ablation. Hopefully this can be discussed further with Dr. Johney Frame at their appointment on January 21.  Followup with me in a few weeks.  [name], MD, [facility] Attending Cardiologist [facility]

## 2013-07-12 ENCOUNTER — Telehealth: Payer: Self-pay | Admitting: *Deleted

## 2013-07-12 NOTE — Telephone Encounter (Signed)
Short term disability paperwork completed. Notified patient. Left at front desk for pick up

## 2013-07-13 LAB — BASIC METABOLIC PANEL
BUN: 64 mg/dL — ABNORMAL HIGH (ref 6–23)
CHLORIDE: 97 meq/L (ref 96–112)
CO2: 26 mEq/L (ref 19–32)
Calcium: 8.4 mg/dL (ref 8.4–10.5)
Creat: 2.36 mg/dL — ABNORMAL HIGH (ref 0.50–1.35)
Glucose, Bld: 158 mg/dL — ABNORMAL HIGH (ref 70–99)
POTASSIUM: 4.3 meq/L (ref 3.5–5.3)
SODIUM: 133 meq/L — AB (ref 135–145)

## 2013-07-13 LAB — BRAIN NATRIURETIC PEPTIDE: BRAIN NATRIURETIC PEPTIDE: 516.7 pg/mL — AB (ref 0.0–100.0)

## 2013-07-17 ENCOUNTER — Ambulatory Visit (INDEPENDENT_AMBULATORY_CARE_PROVIDER_SITE_OTHER): Payer: Managed Care, Other (non HMO) | Admitting: Internal Medicine

## 2013-07-17 ENCOUNTER — Encounter: Payer: Self-pay | Admitting: Internal Medicine

## 2013-07-17 VITALS — BP 110/60 | HR 92 | Ht 68.0 in | Wt 239.2 lb

## 2013-07-17 DIAGNOSIS — I10 Essential (primary) hypertension: Secondary | ICD-10-CM

## 2013-07-17 DIAGNOSIS — E1159 Type 2 diabetes mellitus with other circulatory complications: Secondary | ICD-10-CM

## 2013-07-17 DIAGNOSIS — I255 Ischemic cardiomyopathy: Secondary | ICD-10-CM

## 2013-07-17 DIAGNOSIS — E119 Type 2 diabetes mellitus without complications: Secondary | ICD-10-CM

## 2013-07-17 DIAGNOSIS — I4891 Unspecified atrial fibrillation: Secondary | ICD-10-CM

## 2013-07-17 DIAGNOSIS — I2589 Other forms of chronic ischemic heart disease: Secondary | ICD-10-CM

## 2013-07-17 DIAGNOSIS — I509 Heart failure, unspecified: Secondary | ICD-10-CM

## 2013-07-17 DIAGNOSIS — N183 Chronic kidney disease, stage 3 unspecified: Secondary | ICD-10-CM

## 2013-07-17 DIAGNOSIS — E1169 Type 2 diabetes mellitus with other specified complication: Secondary | ICD-10-CM

## 2013-07-17 DIAGNOSIS — Z951 Presence of aortocoronary bypass graft: Secondary | ICD-10-CM

## 2013-07-17 DIAGNOSIS — I5021 Acute systolic (congestive) heart failure: Secondary | ICD-10-CM

## 2013-07-17 MED ORDER — MOMETASONE FUROATE 50 MCG/ACT NA SUSP: 2.0000 | Freq: Every day | NASAL | Status: AC

## 2013-07-17 NOTE — Patient Instructions (Addendum)
Nasonex has been sent into your pharmacy, [facility] on [address]. Alternate this with an over-the-counter nasal saline spray.   Dr Rennis Golden wants you to follow-up in 3 months. You will receive a reminder letter in the mail two months in advance. If you don't receive a letter, please call our office to schedule the follow-up appointment.

## 2013-07-17 NOTE — Progress Notes (Signed)
07/17/2013   PCP: Carollee Herter, MD   Chief Complaint  Patient presents with  . Follow-up    1 week; SOB about the same; weights 237.6lbs to 242lbs    Primary Cardiologist: Dr. Rennis Golden  HPI: 58 year old WMM with a past medical history significant for coronary artery disease and bypass surgery x 3 vessels in 2005 (LIMA to LAD, SVG to DIAG, and SVG to OM1/OM2). He also has hyperlipidemia and hypertriglyceridemia with triglycerides of over 3000 in the past, morbid obesity, OSA on CPAP and recently atrial fibrillation. Most recent labs showed triglycerides of 306, HDL of 36, VLDL 61 and LDL of 89. He had an outpatient cardiac catheterization on Nov 18, 2010 that showed the grafts were widely patent. Because of a solitary kidney he did not have an LVgram. His last creatinine October 11, 2012 was 1.58. The BUN was 31. The patient was seen on October 10, 2012 and was found to be in atrial fibrillation. He was scheduled for repeat cardioversion and had been placed on Xarelto at that time. Dr. Rennis Golden did not feel it was safe without general anesthesia to do the TEE due to his history of obstructive sleep apnea and relatively thick neck. The patient also had interrupted his Xarelto for 1 day due to gingival bleeding. He did undergo successful cardioversion to sinus rhythm after several weeks of anticoagulation and reported that he felt better. Subsequently he had an echocardiogram which showed a newly reduced ejection fraction of 35%. This is in the setting of worsening shortness of breath. Dr. Rennis Golden recently started him on low-dose Lasix however his creatinine was rising above 3. He has never seen a nephrologist. As above, he is known to have a solitary kidney. 10/16 he returned for follow up and reported markedly increased shortness of breath without much improvement with diuresis. It is noted that he is in atrial fibrillation with rapid ventricular response. I suspect either this or coronary bypass graft  dysfunction may be contributing to his heart failure. He was noted to be in atrial fib on echo in Sept 2014. EF 30-40%, mild to mod.MR, OA ok pressure 41 mmHg. Pt admitted to [facility]. RHC was completed but LHC held due to rising Cr. Lexiscan myoview completed. Also DCCV to SR. With abnormal myoview pt underwent LHC Finding patent grafts. LVEDP 29 mmHg. Renal also followed.  He was recently seen in the office with recurrent atrial fibrillation and rapid ventricular response. Dr. Rennis Golden increased his metoprolol to 25 mg twice daily and referred him to Dr. Johney Frame for evaluation of possible antiarrhythmic therapy versus atrial fibrillation ablation. [name] [name] had limited options for antiarrhythmic therapy, but he had not failed treatment to this point. Dr. Johney Frame recommended starting amiodarone and he has been taking 400 mg twice daily for a loading dose. He reports over the past several days that he has had marked increase in shortness of breath and in fact over the weekend was not able to do much in the way of activity and essentially slept all day. He has a pulse oximeter at home which are showing rest oxygen saturations in the mid-80s. He has not had significant weight gain and, in fact his weight is actually 3 pounds lighter than his last office visit. His EKG on admit did show sinus rhythm at 77 with a left bundle branch block and PVCs. He was able to walk 2 laps around the office but then was completely exhausted with oxygen saturations in the low to  mid 6680s. He was placed on oxygen in the office with a quick improvement in his symptoms. Dr. Rennis GoldenHilty admitted for Heart failure and possible amiodarone toxicity. CCM/Pulmonary consult was obtained. CT of chest was completed and IV steroids started. IV diuretics were also started. Pt began to improve with both steroids and diuresing. He has been maintaining SR. TSH was elevated but normal freeT3 & T4. Will monitor now that Amiodarone has been stopped.   Pt continues to  improve with a drop in oxygen with ambulation and at rest on Room Air. Room Air at rest 85%, RA ambulation 82%, 4 L [place] with ambulation 86%. He now uses home oxygen and oxygen in his cpap at night.   [name] [name] returns today for followup. He does report somewhat worsening shortness of breath. He recently saw Dr. Marchelle Gearingamaswamy, who recommended an increase in his steroids for amiodarone pulmonary toxicity. He reports this has not helped him a lot and he feels that he is more short of breath. He's also had some weight gain now up to 239 pounds.    At his last visit I increased his Lasix and his weight appears to be about the same today. It has gone up and down and fluctuated as much as 5 pounds, but he reports no worsening swelling. He shortness of breath is stable, but not improved. I really feel that he is symptomatic with his A. fib, and we will need to pursue ablation as he has no other antiarrhythmic options.  Allergies  Allergen Reactions  . Amiodarone Shortness Of Breath    Pulmonary toxicity    Current Outpatient Prescriptions  Medication Sig Dispense Refill  . acetaminophen (TYLENOL) 500 MG tablet Take 1,000 mg by mouth every 6 (six) hours as needed for pain.      Marland Kitchen. allopurinol (ZYLOPRIM) 300 MG tablet take 1 tablet by mouth once daily  30 tablet  12  . amLODipine (NORVASC) 5 MG tablet Take 5 mg by mouth daily.      Marland Kitchen. aspirin EC 81 MG tablet Take 81 mg by mouth at bedtime.      Marland Kitchen. atorvastatin (LIPITOR) 20 MG tablet Take 1 tablet (20 mg total) by mouth at bedtime.  30 tablet  6  . busPIRone (BUSPAR) 15 MG tablet take 1 tablet by mouth twice a day AT 10AM AND 5PM  60 tablet  5  . cholecalciferol (VITAMIN D) 1000 UNITS tablet Take 1,000 Units by mouth daily.      . Choline Fenofibrate (TRILIPIX) 135 MG capsule Take 135 mg by mouth daily.      . citalopram (CELEXA) 20 MG tablet Take 20 mg by mouth daily.      Marland Kitchen. FORTESTA 10 MG/ACT (2%) GEL apply 4 PUMPS once daily to SKIN  60 g  5  . furosemide  (LASIX) 40 MG tablet Take 40-60 mg by mouth daily. Take 60mg  AM and take 40mg  PM      . glucose blood test strip Use as instructed(THIS IS FOR THE ONETOUCH VERIO IQ )  100 each  PRN  . hydrALAZINE (APRESOLINE) 50 MG tablet Take 25 mg by mouth 3 (three) times daily. Take 1/2 tab tid      . Lansoprazole (PREVACID PO) Take 1 capsule by mouth at bedtime.      . levalbuterol (XOPENEX HFA) 45 MCG/ACT inhaler Inhale 1-2 puffs into the lungs every 4 (four) hours as needed for wheezing.  1 Inhaler  12  . LOVAZA 1 G capsule take  4 capsules by mouth daily  120 capsule  11  . metoprolol tartrate (LOPRESSOR) 25 MG tablet Take 25 mg by mouth 2 (two) times daily.      . niacin 500 MG CR capsule Take 1 capsule (500 mg total) by mouth at bedtime.  30 capsule  6  . ONETOUCH DELICA LANCETS FINE MISC 1 each by Does not apply route 2 (two) times daily.  100 each  PRN  . OXYGEN-HELIUM IN Inhale 3 L into the lungs continuous.      . potassium chloride SA (K-DUR,KLOR-CON) 20 MEQ tablet Take 2 tablets (40 mEq total) by mouth daily.  60 tablet  6  . predniSONE (DELTASONE) 20 MG tablet Take 20 mg by mouth daily with breakfast.      . Rivaroxaban (XARELTO) 15 MG TABS tablet Take 15 mg by mouth daily.      . mometasone (NASONEX) 50 MCG/ACT nasal spray Place 2 sprays into the nose daily.  17 g  1   No current facility-administered medications for this visit.    Past Medical History  Diagnosis Date  . Hypertension   . Obesity     with a BMI of 35  . Gout   . GERD (gastroesophageal reflux disease)   . Depression   . ASHD (arteriosclerotic heart disease)     hx CABG 2005, patent grafts 04/2013  . Diabetes mellitus   . Hyperlipidemia   . Hypertriglyceridemia     TRIGS OVER 3000 IN THE PAST; MOST RECENT TRIGS OF 306, HDL 36, VLDL 61, LDL 89  . 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 History of tobacco abuse     PT QUIT 07/04/12  . OSA on CPAP   . Shortness of breath   . Nonischemic  cardiomyopathy, ef 30-40% by echo 03/2013 down from 49%, was in atrial fib  at time of Echo 05/01/2013  . CKD (chronic kidney disease) stage 3, GFR 30-59 ml/min, recently was CKD stage 4 with diuressing. 05/01/2013  . Carotid disease, bilateral   . Persistent atrial fibrillation 04/18/2013    DCCV 11/2012; 04/2013  . Chronic anticoagulation 04/18/2013    xarelto  . Amiodarone pulmonary toxicity     Past Surgical History  Procedure Laterality Date  . Appendectomy  1983  . Cardioversion N/A 11/14/2012    Procedure: CARDIOVERSION;  Surgeon: [name], MD;  Location: [facility] ENDOSCOPY;  Service: Cardiovascular;  Laterality: N/A;  . Coronary artery bypass graft  05/11/04    X 4; HAS A VERY SHORT AORTA, ROUND DILATED HEART AS WELL AS A STERNAL ABNORMALITY   . Cardiac catheterization  11/18/10    GRAFTS WIDELY PATENT WITH NO HIGH-GRADE CAD  . Carotids  04/03/12    CAROTID DUPLEX; RIGHT BULB/PROXIMAL ICA: MILD AMT OF FIBROUS PLAQUE SLIGHTLY ELEVATING VELOCITIES WITHIN THE PROXIMAL SEGMENT OF THE INTERNAL CAROTID  ARTERY  . Nm myoview ltd  09/17/09    EF 49%; GLOBAL LV SYSTOLIC FUNCTION MILDLY REDUCED  . Cardiac catheterization  04/2013    patent grafts, also RHC done    NWG:NFAOZHY:QM colds or fevers,  weight decreased by 12 pounds, Skin:no rashes or ulcers HEENT:no blurred vision, no congestion CV:see HPI PUL:see HPI GI:no diarrhea constipation or melena, no indigestion GU:no hematuria, no dysuria MS:no joint pain, no claudication Neuro:no syncope, no lightheadedness Endo:borderline diabetes has been diet controlled but with steroids was running higher in the hospital, no thyroid disease  PHYSICAL EXAM BP 110/60  Pulse 92  Ht 5\' 8"  (1.727 m)  Wt 239 lb 3.2 oz (108.5 kg)  BMI 36.38 kg/m2 General:Pleasant affect, NAD Skin:Warm and dry, brisk capillary refill HEENT:normocephalic, sclera clear, mucus membranes moist Neck:supple, no JVD sitting upright,  Heart:S1S2 RRR without murmur,  gallup, rub or click Lungs:clear without rales, rhonchi, or wheezes VHQ:IONG, non tender, + BS, do not palpate liver spleen or masses Ext: 1+ LE edema, 2+ radial pulses Neuro:alert and oriented, MAE, follows commands, + facial symmetry  ASSESSMENT AND PLAN Patient Active Problem List   Diagnosis Date Noted  . Hyperglycemia, with steroids 06/13/2013  . 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 Hypoxia 06/10/2013  . Hypoxemia 06/10/2013  . Acute pulmonary edema 06/10/2013  . Nonischemic cardiomyopathy, ef 30-40% by echo 03/2013 down from 49%, was in atrial fib  at time of Echo 05/01/2013  . CKD (chronic kidney disease) stage 3, GFR 30-59 ml/min, recently was CKD stage 4 with diuressing. 05/01/2013  . Carotid disease, bilateral   . Acute on chronic renal insufficiency 04/21/2013  . Atrial fibrillation with RVR- DCCV 04/19/13 to NSR 04/18/2013  . Chronic anticoagulation 04/18/2013  . Acute systolic congestive heart failure, NYHA class 4 04/18/2013  . S/P CABG x 3 2005. Patent grafts 04/2013 04/18/2013  . Obesity: BMI 36.8 11/27/2012  . Persistent atrial fibrillation 10/15/2012  . Type II or unspecified type diabetes mellitus without mention of complication, not stated as uncontrolled 07/07/2011  . Hypogonadism male 06/23/2011  . Hypertension associated with diabetes 11/25/2010  . Hyperlipidemia LDL goal <70 11/25/2010  . Sleep apnea- on C-pap 11/25/2010  . Depression 11/25/2010  . Atrophy of left kidney, solitary kidney 11/25/2010  . 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  [name] [name] has had no significant improvement in his shortness of breath with an increase in Lasix. His weight is generally about the same. I believe he is as euvolemic as we can get them at this point. His BNP was only 100 points greater than it had been and his creatinine is stable. I would recommend continuing his current dose of diuretics, continued followup with his  pulmonologist for amiodarone lung toxicity. In addition, he will likely benefit from reestablishing sinus rhythm, hopefully with AF ablation. He is scheduled to see Dr. Johney Frame next week.  [name], MD, [facility] Attending Cardiologist [facility]

## 2013-07-23 ENCOUNTER — Encounter: Payer: Self-pay | Admitting: Family Medicine

## 2013-07-23 ENCOUNTER — Ambulatory Visit (INDEPENDENT_AMBULATORY_CARE_PROVIDER_SITE_OTHER): Payer: Managed Care, Other (non HMO) | Admitting: Family Medicine

## 2013-07-23 VITALS — BP 144/80 | HR 113 | Wt 239.0 lb

## 2013-07-23 DIAGNOSIS — N189 Chronic kidney disease, unspecified: Secondary | ICD-10-CM

## 2013-07-23 DIAGNOSIS — I509 Heart failure, unspecified: Secondary | ICD-10-CM

## 2013-07-23 DIAGNOSIS — J984 Other disorders of lung: Secondary | ICD-10-CM

## 2013-07-23 DIAGNOSIS — I4891 Unspecified atrial fibrillation: Secondary | ICD-10-CM

## 2013-07-23 DIAGNOSIS — E119 Type 2 diabetes mellitus without complications: Secondary | ICD-10-CM

## 2013-07-23 DIAGNOSIS — J708 Respiratory conditions due to other specified external agents: Secondary | ICD-10-CM

## 2013-07-23 DIAGNOSIS — I5021 Acute systolic (congestive) heart failure: Secondary | ICD-10-CM

## 2013-07-23 DIAGNOSIS — J810 Acute pulmonary edema: Secondary | ICD-10-CM

## 2013-07-23 DIAGNOSIS — E669 Obesity, unspecified: Secondary | ICD-10-CM

## 2013-07-23 DIAGNOSIS — T462X5A Adverse effect of other antidysrhythmic drugs, initial encounter: Secondary | ICD-10-CM

## 2013-07-23 DIAGNOSIS — I4819 Other persistent atrial fibrillation: Secondary | ICD-10-CM

## 2013-07-23 DIAGNOSIS — N289 Disorder of kidney and ureter, unspecified: Secondary | ICD-10-CM

## 2013-07-23 MED ORDER — INSULIN GLARGINE 100 UNIT/ML SOLOSTAR PEN: 10.0000 [IU] | PEN_INJECTOR | Freq: Every day | SUBCUTANEOUS | Status: AC

## 2013-07-23 NOTE — Progress Notes (Signed)
   Subjective:    Patient ID: [name] [name], male    DOB: Apr 05, 1956, 58 y.o.   MRN: [id number]  HPI He is here for recheck. He is no longer taking metformin due to decreasing renal function. He is being treated presently for amiodarone toxicity. Presently he is taking prednisone and is on oxygen therapy. He does have underlying atrial fib and is scheduled for possibly having another ablation. He is quite fatigued and at this point is unable to work. He has been checking his blood sugars and they run between 100 and 250. He continues on prednisone. The hospital record was reviewed.   Review of Systems     Objective:   Physical Exam Alert with nasal cannula in place. He is diffusely swollen. Lungs are clear to auscultation. Cardiac exam shows an irregular rhythm.       Assessment & Plan:  Acute on chronic renal insufficiency  Acute pulmonary edema  Acute systolic congestive heart failure, NYHA class 4  Amiodarone pulmonary toxicity  Obesity: BMI 36.8  Persistent atrial fibrillation  Type II or unspecified type diabetes mellitus without mention of complication, not stated as uncontrolled  I discussed options with him concerning his blood sugar especially in regard to his renal function and being on steroids. Discussed sliding scale versus Lantus insulin. He would like to try that Lantus insulin. He is to closely monitor his blood sugars checking them at least twice per day. Hopefully he will eventually get off prednisone which will make care for his diabetes much easier 25 minutes spent discussing all these issues with him.

## 2013-07-23 NOTE — Patient Instructions (Signed)
Check your blood sugars about 3 times per day. Do in the morning when you get up and then either before a meal or 2 hours after a meal and keep in touch with me

## 2013-07-24 ENCOUNTER — Encounter: Payer: Self-pay | Admitting: Internal Medicine

## 2013-07-24 ENCOUNTER — Telehealth: Payer: Self-pay | Admitting: Internal Medicine

## 2013-07-24 ENCOUNTER — Ambulatory Visit (INDEPENDENT_AMBULATORY_CARE_PROVIDER_SITE_OTHER): Payer: Managed Care, Other (non HMO) | Admitting: Internal Medicine

## 2013-07-24 VITALS — BP 130/72 | HR 120 | Ht 68.0 in | Wt 244.0 lb

## 2013-07-24 DIAGNOSIS — I428 Other cardiomyopathies: Secondary | ICD-10-CM

## 2013-07-24 DIAGNOSIS — N183 Chronic kidney disease, stage 3 unspecified: Secondary | ICD-10-CM

## 2013-07-24 DIAGNOSIS — I4891 Unspecified atrial fibrillation: Secondary | ICD-10-CM

## 2013-07-24 DIAGNOSIS — J708 Respiratory conditions due to other specified external agents: Secondary | ICD-10-CM

## 2013-07-24 DIAGNOSIS — I10 Essential (primary) hypertension: Secondary | ICD-10-CM

## 2013-07-24 DIAGNOSIS — J984 Other disorders of lung: Secondary | ICD-10-CM

## 2013-07-24 DIAGNOSIS — T462X5A Adverse effect of other antidysrhythmic drugs, initial encounter: Secondary | ICD-10-CM

## 2013-07-24 DIAGNOSIS — I4819 Other persistent atrial fibrillation: Secondary | ICD-10-CM

## 2013-07-24 DIAGNOSIS — E1169 Type 2 diabetes mellitus with other specified complication: Secondary | ICD-10-CM

## 2013-07-24 DIAGNOSIS — E1159 Type 2 diabetes mellitus with other circulatory complications: Secondary | ICD-10-CM

## 2013-07-24 DIAGNOSIS — R0902 Hypoxemia: Secondary | ICD-10-CM

## 2013-07-24 MED ORDER — METOPROLOL TARTRATE 50 MG PO TABS: 50.0000 mg | ORAL_TABLET | Freq: Two times a day (BID) | ORAL | Status: AC

## 2013-07-24 NOTE — Patient Instructions (Signed)
Your physician wants you to follow-up in: 6 weeks with Dr. Johney Frame.   Your physician has recommended you make the following change in your medication:  1) Increase Metoprolol to 50mg  twice daily

## 2013-07-24 NOTE — Telephone Encounter (Signed)
RX phoned into pharm  

## 2013-07-24 NOTE — Telephone Encounter (Signed)
Pt states that he was given the lantus but not the needles to go with it. He needs a rx called in for BD Ultra Fine 3 mini pen needles. Send in to [facility].

## 2013-07-24 NOTE — Progress Notes (Signed)
PCP:  Carollee Herter, MD Primary Cardiologist:  Dr Rennis Golden  The patient presents today for routine electrophysiology followup.  He has had decline since our last visit with lung disease felt to likely be due to amiodarone toxicity.  He is now off of amiodarone and back in afib.  He is on O2 3L daily.  He has SOB with moderate activity. Today, he denies symptoms of palpitations, chest pain, lower extremity edema, dizziness, presyncope, syncope, or neurologic sequela.  The patient feels that he is tolerating medications without difficulties and is otherwise without complaint today.   Past Medical History  Diagnosis Date  . Hypertension   . Obesity     with a BMI of 35  . Gout   . GERD (gastroesophageal reflux disease)   . Depression   . ASHD (arteriosclerotic heart disease)     hx CABG 2005, patent grafts 04/2013  . Diabetes mellitus   . Hyperlipidemia   . Hypertriglyceridemia     TRIGS OVER 3000 IN THE PAST; MOST RECENT TRIGS OF 306, HDL 36, VLDL 61, LDL 89  . 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 History of tobacco abuse     PT QUIT 07/04/12  . OSA on CPAP   . Shortness of breath   . Nonischemic cardiomyopathy, ef 30-40% by echo 03/2013 down from 49%, was in atrial fib  at time of Echo 05/01/2013  . CKD (chronic kidney disease) stage 3, GFR 30-59 ml/min, recently was CKD stage 4 with diuressing. 05/01/2013  . Carotid disease, bilateral   . Persistent atrial fibrillation 04/18/2013    DCCV 11/2012; 04/2013  . Chronic anticoagulation 04/18/2013    xarelto  . Amiodarone pulmonary toxicity    Past Surgical History  Procedure Laterality Date  . Appendectomy  1983  . Cardioversion N/A 11/14/2012    Procedure: CARDIOVERSION;  Surgeon: [name], MD;  Location: [facility] ENDOSCOPY;  Service: Cardiovascular;  Laterality: N/A;  . Coronary artery bypass graft  05/11/04    X 4; HAS A VERY SHORT AORTA, ROUND DILATED HEART AS WELL AS A STERNAL ABNORMALITY   .  Cardiac catheterization  11/18/10    GRAFTS WIDELY PATENT WITH NO HIGH-GRADE CAD  . Carotids  04/03/12    CAROTID DUPLEX; RIGHT BULB/PROXIMAL ICA: MILD AMT OF FIBROUS PLAQUE SLIGHTLY ELEVATING VELOCITIES WITHIN THE PROXIMAL SEGMENT OF THE INTERNAL CAROTID  ARTERY  . Nm myoview ltd  09/17/09    EF 49%; GLOBAL LV SYSTOLIC FUNCTION MILDLY REDUCED  . Cardiac catheterization  04/2013    patent grafts, also RHC done    Current Outpatient Prescriptions  Medication Sig Dispense Refill  . acetaminophen (TYLENOL) 500 MG tablet Take 1,000 mg by mouth every 6 (six) hours as needed for pain.      Marland Kitchen allopurinol (ZYLOPRIM) 300 MG tablet take 1 tablet by mouth once daily  30 tablet  12  . amLODipine (NORVASC) 5 MG tablet Take 5 mg by mouth daily.      Marland Kitchen aspirin EC 81 MG tablet Take 81 mg by mouth at bedtime.      Marland Kitchen atorvastatin (LIPITOR) 20 MG tablet Take 1 tablet (20 mg total) by mouth at bedtime.  30 tablet  6  . busPIRone (BUSPAR) 15 MG tablet take 1 tablet by mouth twice a day AT 10AM AND 5PM  60 tablet  5  . cholecalciferol (VITAMIN D) 1000 UNITS tablet Take 1,000 Units by mouth daily.      Marland Kitchen  Choline Fenofibrate (TRILIPIX) 135 MG capsule Take 135 mg by mouth daily.      . citalopram (CELEXA) 20 MG tablet Take 20 mg by mouth daily.      Marland Kitchen. FORTESTA 10 MG/ACT (2%) GEL apply 4 PUMPS once daily to SKIN  60 g  5  . furosemide (LASIX) 40 MG tablet Take 40-60 mg by mouth daily. Take 60mg  AM and take 40mg  PM      . glucose blood test strip Use as instructed(THIS IS FOR THE ONETOUCH VERIO IQ )  100 each  PRN  . hydrALAZINE (APRESOLINE) 50 MG tablet Take 25 mg by mouth 3 (three) times daily. Take 1/2 tab tid      . Insulin Glargine (LANTUS SOLOSTAR) 100 UNIT/ML Solostar Pen Inject 10 Units into the skin daily at 10 pm.  5 pen  PRN  . Lansoprazole (PREVACID PO) Take 1 capsule by mouth at bedtime.      . levalbuterol (XOPENEX HFA) 45 MCG/ACT inhaler Inhale 1-2 puffs into the lungs every 4 (four) hours as needed for  wheezing.  1 Inhaler  12  . LOVAZA 1 G capsule take 4 capsules by mouth daily  120 capsule  11  . metoprolol tartrate (LOPRESSOR) 25 MG tablet Take 25 mg by mouth 2 (two) times daily.      . mometasone (NASONEX) 50 MCG/ACT nasal spray Place 2 sprays into the nose daily.  17 g  1  . niacin 500 MG CR capsule Take 1 capsule (500 mg total) by mouth at bedtime.  30 capsule  6  . OXYGEN-HELIUM IN Inhale 3 L into the lungs continuous.      . potassium chloride SA (K-DUR,KLOR-CON) 20 MEQ tablet Take 2 tablets (40 mEq total) by mouth daily.  60 tablet  6  . predniSONE (DELTASONE) 20 MG tablet Take 20 mg by mouth daily with breakfast.      . Rivaroxaban (XARELTO) 15 MG TABS tablet Take 15 mg by mouth daily.       No current facility-administered medications for this visit.    Allergies  Allergen Reactions  . Amiodarone Shortness Of Breath    Pulmonary toxicity    History   Social History  . Marital Status: Single    Spouse Name: N/A    Number of Children: N/A  . Years of Education: N/A   Occupational History  . Psychologist, occupationalcomputer installer (for labs)     [employer]   Social History Main Topics  . Smoking status: Former Smoker -- 1.00 packs/day for 40 years    Types: Cigarettes    Quit date: 07/04/2012  . Smokeless tobacco: Never Used  . 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 Drug Use: No  . Sexual Activity: Yes   Other Topics Concern  . Not on file   Social History Narrative   3 YRS COLLEGE      DOES NOT EXERCISE      TRAVELS FREQUENTLY AROUND THE COUNTRY FOR [place] JOB[place] APPROX 3 WEEKS OUT OF EACH MONTH; USUALLY LESS THAN 2 HOURS ON A PLANE          Family History  Problem Relation Age of Onset  . Heart disease Father   . Cancer Father 769    LUNG  . Hypertension Father   . Diabetes Brother   . Stroke Maternal Grandmother 60  . Heart disease Paternal Grandmother     ROS-  All systems are reviewed and are negative except as  outlined in the HPI above  Physical Exam: Filed Vitals:   07/24/13 1417  BP: 130/72  Pulse: 120  Height: 5\' 8"  (1.727 m)  Weight: 244 lb (110.678 kg)    GEN- The patient is overweight appearing, alert and oriented x 3 today.   Head- normocephalic, atraumatic Eyes-  Sclera clear, conjunctiva pink Ears- hearing intact Oropharynx- clear Neck- supple  Lungs- Clear to ausculation bilaterally, normal work of breathing Heart- irregular rate and rhythm  GI- soft, NT, ND, + BS Extremities- no clubbing, cyanosis, or edema MS- no significant deformity or atrophy Skin- no rash or lesion Psych- euthymic mood, full affect Neuro- strength and sensation are intact  ekg today reveals afib, V rate 120 bpm, LBBB  Assessment and Plan:  1. Persistent afib He has failed medical therapy with amiodarone due to intolerance. Given his refractory/ persistent afib, I think that our chance of achieving and maintaining sinus rhythm long term is low.  At this time, I would recommend rate control. I will increase metoprolol to 50mg  BID for better rate control today. Continue xarelto for stroke prevention. He will return in 6 weeks to reassess.  Hopefully if his lung disease improves we can consider ablation as an option. Given his renal failure, we would have to alter our ablation strategy to avoid contrast.  He may be better suited for a minimally invasive MAZE procedure, however this would also be challenging given his prior sternotomy. We will discuss his options further upon return.

## 2013-07-25 MED ORDER — INSULIN PEN NEEDLE 29G X 12.7MM MISC: Status: AC

## 2013-07-29 ENCOUNTER — Other Ambulatory Visit: Payer: Self-pay | Admitting: *Deleted

## 2013-07-29 NOTE — Telephone Encounter (Signed)
Refill not appropriate. Defer to pulmonologist

## 2013-08-02 ENCOUNTER — Ambulatory Visit (INDEPENDENT_AMBULATORY_CARE_PROVIDER_SITE_OTHER): Payer: Managed Care, Other (non HMO) | Admitting: Internal Medicine

## 2013-08-02 VITALS — BP 120/78 | HR 104

## 2013-08-02 DIAGNOSIS — J849 Interstitial pulmonary disease, unspecified: Secondary | ICD-10-CM

## 2013-08-02 DIAGNOSIS — T462X5A Adverse effect of other antidysrhythmic drugs, initial encounter: Principal | ICD-10-CM

## 2013-08-02 DIAGNOSIS — J984 Other disorders of lung: Secondary | ICD-10-CM

## 2013-08-02 DIAGNOSIS — R0602 Shortness of breath: Secondary | ICD-10-CM

## 2013-08-02 DIAGNOSIS — G4730 Sleep apnea, unspecified: Secondary | ICD-10-CM

## 2013-08-02 DIAGNOSIS — J708 Respiratory conditions due to other specified external agents: Secondary | ICD-10-CM

## 2013-08-02 MED ORDER — PREDNISONE 20 MG PO TABS: 20.0000 mg | ORAL_TABLET | Freq: Every day | ORAL | Status: AC

## 2013-08-02 NOTE — Patient Instructions (Signed)
CMA will walk you for oxygen levels on Room  Air now  NExt week have High Resolution CT chest without contrast on ILD protocol. Only  Dr Leanna Battles or Dr. Trudie Reed to read   Will call with results to decide next step  Make appt wiut front desk to see one of our sleep docs

## 2013-08-02 NOTE — Progress Notes (Signed)
Subjective:    Patient ID: [name] [name], male    DOB: 06-Oct-1955, 58 y.o.   MRN: [id number]  HPI  OV 06/24/2013  Hospital followup  Appar3ently admitted oct 2014 for A Fib. 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 Concern for acute amio tox. No autoimmune workup. STrted on empiic steroids with dc amio, Currently feels 75% better in terms of dyspnea. Tolerating prednisone okay @ 270mper day. No new complaitns.  Walkt test today 185 feet x 1 lap on RA: desat to 86% (this is bettter than while in hopsitla he says)  REC  Please do CXR today; depending on result will adjust prednisone Do Serum: ESR, ANA,  RF, anti-CCP, ssA, ssB, scl-70, ANCA, Total CK,  Aldolase,  Will call you with results   Telephone call 06/25/13  cxr improved from time of admission but no change from 06/17/13. Blood test so far autoimmune negative but sed rate still high suggesting still continuing inflammati in lung. I would like him to increase his prednisone to 4012mnce daily (monitor his sugars) and see me/TP in 2 weeks for walking desat test and cxr    OV 07/09/2013  Chief Complaint  Patient presents with  . Follow-up    pt states breathing is the same. Pt is having increased dry cough.     FU suspected acute amio tx  On 06/25/13 amio increased to 10m62mr day due to desat with exertion and high esr though he was partially improved. Now on fu he says he is no better. IF he goes all day without o2 he is exhaused though pulse ox does not drop below 94%. STill with dyspnea and cough unchanged; cough is dry.He has been gaining weight due to prednisone  Walk test 185 feet on RA x 3 laps: he dropped to 88% at 2nd lap and HR was 150 and got dyspneic. WE walkd him more and his pulse ox stayed 89% or so and did not drop furtther but HR wsa 150 and this seemed the bigger  problem,  CXR - appears improved from admit but similar to 06/25/13 Dg Chest 2 View  07/09/2013   CLINICAL DATA:  Shortness of breath  EXAM: CHEST  2 VIEW  COMPARISON:  06/24/2013  FINDINGS: Cardiac shadow is enlarged. Postsurgical changes are again noted. The lungs are well aerated. Mild pulmonary vascular congestion remains. No focal infiltrate is same.  IMPRESSION: Mild vascular congestion.  No acute abnormality is noted.   Electronically Signed   By: MarkInez Catalina.   On: 07/09/2013 16:25   rec  #Shortness of breath  - due to amio lung tox that is improving but also due to obesity related lung atelectasis and A Fib - you are some improved from visit 06/25/13 -- cut down prednisone to 30mg13m day  - ensure followup with cardiology  - agree with application for short term disability - continue oxygen but use finger pulse ox; keep it > 88%  #SLeep apnea  - refer to one of the sleep docs in our office for improved sleep apnea control that could help your A Fib  #Followup 3 weeks with mec    OV 08/02/2013  followup multifactorial dyspnea in the setting of obesity, sleep apnea, difficult to control atrial ablation and 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 Follow-up    Pt states that he does not notice any improvement in SOB. he is down to 104m a day of prednisone.     He is now on 20 mg of prednisone a day. His dyspnea is no better. He gets very dyspneic for doing groceries changing clothes this is despite the oxygen. He is not sure what his room I pulse ox is the lowest that he is recorded in the morning at home was 89% on room air after shower. Reviewed cardiology notes: Dr. ARayann Hemanfeels that is it fibrillation is difficult to control and is not a suitable candidate for ablation. Walk test 185 feet x 3 laps on RA: did not desaturate suggesting ILD perhaps improved  Last CT chest 06/10/14 at time of dx. No FU CT  chest yet   Past medical history: He still does not have a physician for sleep apnea. I referred him to see one of the doctors in our office but he is yet to make an appointment. Is agreeable to another referral   Review of Systems  Constitutional: Negative for fever and unexpected weight change.  HENT: Negative for congestion, dental problem, ear pain, nosebleeds, postnasal drip, rhinorrhea, sinus pressure, sneezing, sore throat and trouble swallowing.   Eyes: Negative for redness and itching.  Respiratory: Positive for shortness of breath. Negative for cough, chest tightness and wheezing.   Cardiovascular: Negative for palpitations and leg swelling.  Gastrointestinal: Negative for nausea and vomiting.  Genitourinary: Negative for dysuria.  Musculoskeletal: Negative for joint swelling.  Skin: Negative for rash.  Neurological: Negative for headaches.  Hematological: Does not bruise/bleed easily.  Psychiatric/Behavioral: Negative for dysphoric mood. The patient is not nervous/anxious.        Objective:   Physical Exam  Nursing note and vitals reviewed. Constitutional: He is oriented to person, place, and time. He appears well-developed and well-nourished. No distress.  There is no weight on file to calculate BMI.   HENT:  Head: Normocephalic and atraumatic.  Right Ear: External ear normal.  Left Ear: External ear normal.  Mouth/Throat: Oropharynx is clear and moist. No oropharyngeal exudate.  Huge fat tissue in neck MAllampatti class 4  Eyes: Conjunctivae and EOM are normal. Pupils are equal, round, and reactive to light. Right eye exhibits no discharge. Left eye exhibits no discharge. No scleral icterus.  Neck: Normal range of motion. Neck supple. No JVD present. No tracheal deviation present. No thyromegaly present.  Cardiovascular: Normal rate, regular rhythm and intact distal pulses.  Exam reveals no gallop and no friction rub.   No murmur heard. Pulmonary/Chest: Effort  normal and breath sounds normal. No respiratory distress. He has no wheezes. He has no rales. He exhibits no tenderness.  No crackles  Abdominal: Soft. Bowel sounds are normal. He exhibits no distension and no mass. There is no tenderness. There is no rebound and no guarding.  Musculoskeletal: Normal range of motion. He exhibits no edema and no tenderness.  Lymphadenopathy:    He has no cervical adenopathy.  Neurological: He is alert and oriented to person, place, and time. He has normal reflexes. No cranial nerve deficit. Coordination normal.  Skin: Skin is warm and dry. No rash noted. He is not diaphoretic. No erythema. No pallor.  Psychiatric: He has a normal mood and affect. His behavior is normal. Judgment and thought content normal.   There were no vitals filed for this visit.  Filed Vitals:   08/02/13 1432  BP: 120/78  Pulse: 104  SpO2: 98%         Assessment & Plan:

## 2013-08-04 ENCOUNTER — Encounter: Payer: Self-pay | Admitting: Internal Medicine

## 2013-08-04 NOTE — Assessment & Plan Note (Signed)
Re-refer to our sleep docs in our office. He is yet to make appt with them and has no sleep doc

## 2013-08-04 NOTE — Assessment & Plan Note (Signed)
I have  A feeling that his ILD is improved given resolution of hypexmoia with exertions. However, dyspnea is no better. Get CT chest followup to put the issue of ILD as cause of dyspnea to rest. Will call him with results  (> 50% of this 15 min visit spent in face to face counseling)

## 2013-08-06 ENCOUNTER — Ambulatory Visit (INDEPENDENT_AMBULATORY_CARE_PROVIDER_SITE_OTHER): Payer: Managed Care, Other (non HMO) | Admitting: Family Medicine

## 2013-08-06 ENCOUNTER — Encounter: Payer: Self-pay | Admitting: Family Medicine

## 2013-08-06 VITALS — BP 124/76 | HR 94 | Wt 235.0 lb

## 2013-08-06 DIAGNOSIS — N289 Disorder of kidney and ureter, unspecified: Secondary | ICD-10-CM

## 2013-08-06 DIAGNOSIS — Z79899 Other long term (current) drug therapy: Secondary | ICD-10-CM

## 2013-08-06 DIAGNOSIS — N189 Chronic kidney disease, unspecified: Secondary | ICD-10-CM

## 2013-08-06 DIAGNOSIS — E119 Type 2 diabetes mellitus without complications: Secondary | ICD-10-CM

## 2013-08-06 DIAGNOSIS — J810 Acute pulmonary edema: Secondary | ICD-10-CM

## 2013-08-06 LAB — COMPREHENSIVE METABOLIC PANEL
ALBUMIN: 3.5 g/dL (ref 3.5–5.2)
ALT: 23 U/L (ref 0–53)
AST: 13 U/L (ref 0–37)
Alkaline Phosphatase: 31 U/L — ABNORMAL LOW (ref 39–117)
BUN: 62 mg/dL — AB (ref 6–23)
CALCIUM: 9.3 mg/dL (ref 8.4–10.5)
CHLORIDE: 99 meq/L (ref 96–112)
CO2: 27 meq/L (ref 19–32)
CREATININE: 2.36 mg/dL — AB (ref 0.50–1.35)
GLUCOSE: 190 mg/dL — AB (ref 70–99)
POTASSIUM: 3.7 meq/L (ref 3.5–5.3)
Sodium: 136 mEq/L (ref 135–145)
Total Bilirubin: 0.6 mg/dL (ref 0.2–1.2)
Total Protein: 6.2 g/dL (ref 6.0–8.3)

## 2013-08-06 NOTE — Progress Notes (Signed)
   Subjective:    Patient ID: [name] [name], male    DOB: 05/29/1956, 58 y.o.   MRN: [id number]  HPI He is here for recheck on his diabetes. He continues on medications listed in the chart including prednisone as well as multiple diuretics. He is still using the nasal cannula. He states that his blood sugars run in the low 100s.   Review of Systems     Objective:   Physical Exam Alert and in no distress otherwise not examined       Assessment & Plan:  Type II or unspecified type diabetes mellitus without mention of complication, not stated as uncontrolled  Encounter for long-term (current) use of other medications - Plan: Comprehensive metabolic panel  Acute on chronic renal insufficiency - Plan: Comprehensive metabolic panel  Acute pulmonary edema - Plan: Comprehensive metabolic panel  He will continue on his present insulin dosing of 10 units. If his prednisone dosing is decrease, we will consider making a change here. Also check a complete metabolic to check his renal as well as electrolyte status.

## 2013-08-08 ENCOUNTER — Telehealth: Payer: Self-pay | Admitting: *Deleted

## 2013-08-08 NOTE — Telephone Encounter (Signed)
Faxed to [employer] completed "request for medical information" for short term disability  Completed capabilities & limitations worksheet and office visit note & EKG from 07/17/13

## 2013-08-09 ENCOUNTER — Ambulatory Visit (INDEPENDENT_AMBULATORY_CARE_PROVIDER_SITE_OTHER)
Admission: RE | Admit: 2013-08-09 | Discharge: 2013-08-09 | Disposition: A | Discharge status: Home / Self Care | Payer: Managed Care, Other (non HMO) | Source: Ambulatory Visit | Attending: Internal Medicine | Admitting: Internal Medicine

## 2013-08-09 DIAGNOSIS — J849 Interstitial pulmonary disease, unspecified: Secondary | ICD-10-CM

## 2013-08-09 DIAGNOSIS — J8410 Pulmonary fibrosis, unspecified: Secondary | ICD-10-CM

## 2013-08-10 ENCOUNTER — Telehealth: Payer: Self-pay | Admitting: Family Medicine

## 2013-08-16 ENCOUNTER — Telehealth: Payer: Self-pay | Admitting: Internal Medicine

## 2013-08-16 NOTE — Telephone Encounter (Signed)
CT 26/15 to me looks immprved but with some residual inflammationin base.  Tell him to cut prednisone down to 10mg  per day and return in 4-6 weeks to se me. Needs walk test on RA when he sees me  Dr. Kalman Shan, M.D., [facility].C.P Pulmonary and Critical Care Medicine Staff Physician [facility] Pager: [phone], If no answer or between  15:00h - 7:00h: call [phone]  08/16/2013 12:42 PM      Current outpatient prescriptions:acetaminophen (TYLENOL) 500 MG tablet, Take 1,000 mg by mouth every 6 (six) hours as needed for pain., Disp: , Rfl: ;  allopurinol (ZYLOPRIM) 300 MG tablet, take 1 tablet by mouth once daily, Disp: 30 tablet, Rfl: 12;  amLODipine (NORVASC) 5 MG tablet, Take 5 mg by mouth daily., Disp: , Rfl: ;  aspirin EC 81 MG tablet, Take 81 mg by mouth at bedtime., Disp: , Rfl:  atorvastatin (LIPITOR) 20 MG tablet, Take 1 tablet (20 mg total) by mouth at bedtime., Disp: 30 tablet, Rfl: 6;  busPIRone (BUSPAR) 15 MG tablet, take 1 tablet by mouth twice a day AT 10AM AND 5PM, Disp: 60 tablet, Rfl: 5;  cholecalciferol (VITAMIN D) 1000 UNITS tablet, Take 1,000 Units by mouth daily., Disp: , Rfl: ;  Choline Fenofibrate (TRILIPIX) 135 MG capsule, Take 135 mg by mouth daily., Disp: , Rfl:  citalopram (CELEXA) 20 MG tablet, Take 20 mg by mouth daily., Disp: , Rfl: ;  FORTESTA 10 MG/ACT (2%) GEL, apply 4 PUMPS once daily to SKIN, Disp: 60 g, Rfl: 5;  furosemide (LASIX) 40 MG tablet, Take 40-60 mg by mouth daily. Take 60mg  AM and take 40mg  PM, Disp: , Rfl: ;  glucose blood test strip, Use as instructed(THIS IS FOR THE ONETOUCH VERIO IQ ), Disp: 100 each, Rfl: PRN hydrALAZINE (APRESOLINE) 50 MG tablet, Take 25 mg by mouth 3 (three) times daily. Take 1/2 tab tid, Disp: , Rfl: ;  Insulin Glargine (LANTUS SOLOSTAR) 100 UNIT/ML Solostar Pen, Inject 10 Units into the skin daily at 10 pm., Disp: 5 pen, Rfl: PRN;  Insulin Pen Needle (BD ULTRA-FINE PEN  NEEDLES) 29G X 12.7MM MISC, Use daily, Disp: 100 each, Rfl: 3;  Lansoprazole (PREVACID PO), Take 1 capsule by mouth at bedtime., Disp: , Rfl:  levalbuterol (XOPENEX HFA) 45 MCG/ACT inhaler, Inhale 1-2 puffs into the lungs every 4 (four) hours as needed for wheezing., Disp: 1 Inhaler, Rfl: 12;  LOVAZA 1 G capsule, take 4 capsules by mouth daily, Disp: 120 capsule, Rfl: 11;  metoprolol tartrate (LOPRESSOR) 50 MG tablet, Take 1 tablet (50 mg total) by mouth 2 (two) times daily., Disp: 180 tablet, Rfl: 3 mometasone (NASONEX) 50 MCG/ACT nasal spray, Place 2 sprays into the nose daily., Disp: 17 g, Rfl: 1;  niacin 500 MG CR capsule, Take 1 capsule (500 mg total) by mouth at bedtime., Disp: 30 capsule, Rfl: 6;  OXYGEN-HELIUM IN, Inhale 3 L into the lungs continuous., Disp: , Rfl: ;  potassium chloride SA (K-DUR,KLOR-CON) 20 MEQ tablet, Take 2 tablets (40 mEq total) by mouth daily., Disp: 60 tablet, Rfl: 6 predniSONE (DELTASONE) 20 MG tablet, Take 1 tablet (20 mg total) by mouth daily with breakfast., Disp: 30 tablet, Rfl: 6;  Rivaroxaban (XARELTO) 15 MG TABS tablet, Take 15 mg by mouth daily., Disp: , Rfl:

## 2013-08-16 NOTE — Telephone Encounter (Signed)
Pt advised and appt set. Jennifer Castillo, CMA  

## 2013-08-19 DIAGNOSIS — I959 Hypotension, unspecified: Secondary | ICD-10-CM

## 2013-08-19 HISTORY — DX: Hypotension, unspecified: I95.9

## 2013-08-21 ENCOUNTER — Institutional Professional Consult (permissible substitution): Payer: Managed Care, Other (non HMO) | Admitting: Pulmonary Disease

## 2013-08-21 ENCOUNTER — Emergency Department (HOSPITAL_COMMUNITY): Payer: Managed Care, Other (non HMO)

## 2013-08-21 ENCOUNTER — Inpatient Hospital Stay (HOSPITAL_COMMUNITY)
Admission: EM | Admit: 2013-08-21 | Discharge: 2013-09-01 | DRG: 871 | Disposition: E | Payer: Managed Care, Other (non HMO) | Attending: Pulmonary Disease | Admitting: Pulmonary Disease

## 2013-08-21 ENCOUNTER — Encounter (HOSPITAL_COMMUNITY): Payer: Self-pay | Admitting: Emergency Medicine

## 2013-08-21 DIAGNOSIS — I428 Other cardiomyopathies: Secondary | ICD-10-CM | POA: Diagnosis present

## 2013-08-21 DIAGNOSIS — I6529 Occlusion and stenosis of unspecified carotid artery: Secondary | ICD-10-CM | POA: Diagnosis present

## 2013-08-21 DIAGNOSIS — I658 Occlusion and stenosis of other precerebral arteries: Secondary | ICD-10-CM | POA: Diagnosis present

## 2013-08-21 DIAGNOSIS — R570 Cardiogenic shock: Secondary | ICD-10-CM | POA: Diagnosis present

## 2013-08-21 DIAGNOSIS — J8410 Pulmonary fibrosis, unspecified: Secondary | ICD-10-CM | POA: Diagnosis present

## 2013-08-21 DIAGNOSIS — A419 Sepsis, unspecified organism: Principal | ICD-10-CM

## 2013-08-21 DIAGNOSIS — E876 Hypokalemia: Secondary | ICD-10-CM | POA: Diagnosis present

## 2013-08-21 DIAGNOSIS — I5043 Acute on chronic combined systolic (congestive) and diastolic (congestive) heart failure: Secondary | ICD-10-CM | POA: Diagnosis present

## 2013-08-21 DIAGNOSIS — Z951 Presence of aortocoronary bypass graft: Secondary | ICD-10-CM

## 2013-08-21 DIAGNOSIS — E871 Hypo-osmolality and hyponatremia: Secondary | ICD-10-CM | POA: Diagnosis present

## 2013-08-21 DIAGNOSIS — Q619 Cystic kidney disease, unspecified: Secondary | ICD-10-CM

## 2013-08-21 DIAGNOSIS — K219 Gastro-esophageal reflux disease without esophagitis: Secondary | ICD-10-CM | POA: Diagnosis present

## 2013-08-21 DIAGNOSIS — N179 Acute kidney failure, unspecified: Secondary | ICD-10-CM

## 2013-08-21 DIAGNOSIS — I2510 Atherosclerotic heart disease of native coronary artery without angina pectoris: Secondary | ICD-10-CM | POA: Diagnosis present

## 2013-08-21 DIAGNOSIS — R6521 Severe sepsis with septic shock: Secondary | ICD-10-CM

## 2013-08-21 DIAGNOSIS — F329 Major depressive disorder, single episode, unspecified: Secondary | ICD-10-CM | POA: Diagnosis present

## 2013-08-21 DIAGNOSIS — E872 Acidosis, unspecified: Secondary | ICD-10-CM | POA: Diagnosis present

## 2013-08-21 DIAGNOSIS — I509 Heart failure, unspecified: Secondary | ICD-10-CM | POA: Diagnosis present

## 2013-08-21 DIAGNOSIS — I252 Old myocardial infarction: Secondary | ICD-10-CM

## 2013-08-21 DIAGNOSIS — J810 Acute pulmonary edema: Secondary | ICD-10-CM

## 2013-08-21 DIAGNOSIS — R34 Anuria and oliguria: Secondary | ICD-10-CM | POA: Diagnosis present

## 2013-08-21 DIAGNOSIS — I959 Hypotension, unspecified: Secondary | ICD-10-CM | POA: Diagnosis present

## 2013-08-21 DIAGNOSIS — K7200 Acute and subacute hepatic failure without coma: Secondary | ICD-10-CM | POA: Diagnosis present

## 2013-08-21 DIAGNOSIS — I129 Hypertensive chronic kidney disease with stage 1 through stage 4 chronic kidney disease, or unspecified chronic kidney disease: Secondary | ICD-10-CM | POA: Diagnosis present

## 2013-08-21 DIAGNOSIS — Z7982 Long term (current) use of aspirin: Secondary | ICD-10-CM

## 2013-08-21 DIAGNOSIS — Z9981 Dependence on supplemental oxygen: Secondary | ICD-10-CM

## 2013-08-21 DIAGNOSIS — Q602 Renal agenesis, unspecified: Secondary | ICD-10-CM

## 2013-08-21 DIAGNOSIS — J189 Pneumonia, unspecified organism: Secondary | ICD-10-CM

## 2013-08-21 DIAGNOSIS — N189 Chronic kidney disease, unspecified: Secondary | ICD-10-CM

## 2013-08-21 DIAGNOSIS — Z79899 Other long term (current) drug therapy: Secondary | ICD-10-CM

## 2013-08-21 DIAGNOSIS — Q605 Renal hypoplasia, unspecified: Secondary | ICD-10-CM

## 2013-08-21 DIAGNOSIS — N184 Chronic kidney disease, stage 4 (severe): Secondary | ICD-10-CM | POA: Diagnosis present

## 2013-08-21 DIAGNOSIS — M109 Gout, unspecified: Secondary | ICD-10-CM | POA: Diagnosis present

## 2013-08-21 DIAGNOSIS — E119 Type 2 diabetes mellitus without complications: Secondary | ICD-10-CM | POA: Diagnosis present

## 2013-08-21 DIAGNOSIS — R6520 Severe sepsis without septic shock: Secondary | ICD-10-CM

## 2013-08-21 DIAGNOSIS — Z7901 Long term (current) use of anticoagulants: Secondary | ICD-10-CM

## 2013-08-21 DIAGNOSIS — D696 Thrombocytopenia, unspecified: Secondary | ICD-10-CM | POA: Diagnosis not present

## 2013-08-21 DIAGNOSIS — D72829 Elevated white blood cell count, unspecified: Secondary | ICD-10-CM | POA: Diagnosis present

## 2013-08-21 DIAGNOSIS — E875 Hyperkalemia: Secondary | ICD-10-CM | POA: Diagnosis not present

## 2013-08-21 DIAGNOSIS — I4891 Unspecified atrial fibrillation: Secondary | ICD-10-CM

## 2013-08-21 DIAGNOSIS — I5021 Acute systolic (congestive) heart failure: Secondary | ICD-10-CM

## 2013-08-21 DIAGNOSIS — I079 Rheumatic tricuspid valve disease, unspecified: Secondary | ICD-10-CM | POA: Diagnosis present

## 2013-08-21 DIAGNOSIS — E785 Hyperlipidemia, unspecified: Secondary | ICD-10-CM | POA: Diagnosis present

## 2013-08-21 DIAGNOSIS — G4733 Obstructive sleep apnea (adult) (pediatric): Secondary | ICD-10-CM | POA: Diagnosis present

## 2013-08-21 DIAGNOSIS — N289 Disorder of kidney and ureter, unspecified: Secondary | ICD-10-CM

## 2013-08-21 DIAGNOSIS — D649 Anemia, unspecified: Secondary | ICD-10-CM | POA: Diagnosis present

## 2013-08-21 DIAGNOSIS — Z794 Long term (current) use of insulin: Secondary | ICD-10-CM

## 2013-08-21 DIAGNOSIS — R579 Shock, unspecified: Secondary | ICD-10-CM

## 2013-08-21 DIAGNOSIS — J9600 Acute respiratory failure, unspecified whether with hypoxia or hypercapnia: Secondary | ICD-10-CM | POA: Diagnosis present

## 2013-08-21 DIAGNOSIS — IMO0002 Reserved for concepts with insufficient information to code with codable children: Secondary | ICD-10-CM

## 2013-08-21 DIAGNOSIS — Z87891 Personal history of nicotine dependence: Secondary | ICD-10-CM

## 2013-08-21 DIAGNOSIS — F3289 Other specified depressive episodes: Secondary | ICD-10-CM | POA: Diagnosis present

## 2013-08-21 HISTORY — DX: Hypotension, unspecified: I95.9

## 2013-08-21 LAB — POCT I-STAT 3, ART BLOOD GAS (G3+)
ACID-BASE DEFICIT: 7 mmol/L — AB (ref 0.0–2.0)
Bicarbonate: 15.4 mEq/L — ABNORMAL LOW (ref 20.0–24.0)
O2 Saturation: 99 %
PH ART: 7.444 (ref 7.350–7.450)
PO2 ART: 102 mmHg — AB (ref 80.0–100.0)
TCO2: 16 mmol/L (ref 0–100)
pCO2 arterial: 22.2 mmHg — ABNORMAL LOW (ref 35.0–45.0)

## 2013-08-21 LAB — URINALYSIS, ROUTINE W REFLEX MICROSCOPIC
GLUCOSE, UA: NEGATIVE mg/dL
Hgb urine dipstick: NEGATIVE
Ketones, ur: NEGATIVE mg/dL
LEUKOCYTES UA: NEGATIVE
Nitrite: NEGATIVE
PH: 5 (ref 5.0–8.0)
Protein, ur: >300 mg/dL — AB
Specific Gravity, Urine: 1.026 (ref 1.005–1.030)
Urobilinogen, UA: 1 mg/dL (ref 0.0–1.0)

## 2013-08-21 LAB — CLOSTRIDIUM DIFFICILE BY PCR: Toxigenic C. Difficile by PCR: NEGATIVE

## 2013-08-21 LAB — GLUCOSE, CAPILLARY
GLUCOSE-CAPILLARY: 91 mg/dL (ref 70–99)
GLUCOSE-CAPILLARY: 88 mg/dL (ref 70–99)
Glucose-Capillary: 95 mg/dL (ref 70–99)

## 2013-08-21 LAB — CARBOXYHEMOGLOBIN
CARBOXYHEMOGLOBIN: 1.7 % — AB (ref 0.5–1.5)
CARBOXYHEMOGLOBIN: 1.6 % — AB (ref 0.5–1.5)
METHEMOGLOBIN: 0.8 % (ref 0.0–1.5)
Methemoglobin: 1.6 % — ABNORMAL HIGH (ref 0.0–1.5)
O2 SAT: 41.1 %
O2 SAT: 38.2 %
TOTAL HEMOGLOBIN: 10.5 g/dL — AB (ref 13.5–18.0)
Total hemoglobin: 9.8 g/dL — ABNORMAL LOW (ref 13.5–18.0)

## 2013-08-21 LAB — HEPARIN LEVEL (UNFRACTIONATED): Heparin Unfractionated: >2.2 IU/mL — ABNORMAL HIGH (ref 0.30–0.70)

## 2013-08-21 LAB — TROPONIN I: Troponin I: <0.3 ng/mL (ref <0.30)

## 2013-08-21 LAB — CBC
HEMATOCRIT: 31 % — AB (ref 39.0–52.0)
Hemoglobin: 10 g/dL — ABNORMAL LOW (ref 13.0–17.0)
MCH: 30 pg (ref 26.0–34.0)
MCHC: 32.3 g/dL (ref 30.0–36.0)
MCV: 93.1 fL (ref 78.0–100.0)
Platelets: 295 10*3/uL (ref 150–400)
RBC: 3.33 MIL/uL — AB (ref 4.22–5.81)
RDW: 16.2 % — AB (ref 11.5–15.5)
WBC: 13.8 10*3/uL — ABNORMAL HIGH (ref 4.0–10.5)

## 2013-08-21 LAB — BASIC METABOLIC PANEL
BUN: 66 mg/dL — ABNORMAL HIGH (ref 6–23)
CHLORIDE: 99 meq/L (ref 96–112)
CO2: 14 meq/L — AB (ref 19–32)
Calcium: 9 mg/dL (ref 8.4–10.5)
Creatinine, Ser: 4.49 mg/dL — ABNORMAL HIGH (ref 0.50–1.35)
GFR calc non Af Amer: 13 mL/min — ABNORMAL LOW (ref >90)
GFR, EST AFRICAN AMERICAN: 15 mL/min — AB (ref >90)
Glucose, Bld: 111 mg/dL — ABNORMAL HIGH (ref 70–99)
POTASSIUM: 3.8 meq/L (ref 3.7–5.3)
SODIUM: 135 meq/L — AB (ref 137–147)

## 2013-08-21 LAB — URINE MICROSCOPIC-ADD ON

## 2013-08-21 LAB — LACTIC ACID, PLASMA
Lactic Acid, Venous: 6.6 mmol/L — ABNORMAL HIGH (ref 0.5–2.2)
Lactic Acid, Venous: 5 mmol/L — ABNORMAL HIGH (ref 0.5–2.2)
Lactic Acid, Venous: 4.9 mmol/L — ABNORMAL HIGH (ref 0.5–2.2)

## 2013-08-21 LAB — POCT I-STAT TROPONIN I: TROPONIN I, POC: 0.04 ng/mL (ref 0.00–0.08)

## 2013-08-21 LAB — PRO B NATRIURETIC PEPTIDE: Pro B Natriuretic peptide (BNP): 11418 pg/mL — ABNORMAL HIGH (ref 0–125)

## 2013-08-21 LAB — CORTISOL: Cortisol, Plasma: 122 ug/dL

## 2013-08-21 LAB — ABO/RH: ABO/RH(D): A POS

## 2013-08-21 LAB — MRSA PCR SCREENING: MRSA BY PCR: NEGATIVE

## 2013-08-21 LAB — APTT
APTT: 41 s — AB (ref 24–37)
APTT: 66 s — AB (ref 24–37)

## 2013-08-21 LAB — CG4 I-STAT (LACTIC ACID): LACTIC ACID, VENOUS: 5.53 mmol/L — AB (ref 0.5–2.2)

## 2013-08-21 MED ORDER — HEPARIN (PORCINE) IN NACL 100-0.45 UNIT/ML-% IJ SOLN
1550.0000 [IU]/h | INTRAMUSCULAR | Status: DC
  Administered 2013-08-21 – 2013-08-22 (×2): 1400 [IU]/h via INTRAVENOUS
  Administered 2013-08-23: 1550 [IU]/h via INTRAVENOUS
  Filled 2013-08-21 (×4): qty 250

## 2013-08-21 MED ORDER — SODIUM CHLORIDE 0.9 % IV BOLUS (SEPSIS)
500.0000 mL | Freq: Once | INTRAVENOUS | Status: AC
  Administered 2013-08-21: 500 mL via INTRAVENOUS

## 2013-08-21 MED ORDER — SODIUM CHLORIDE 0.9 % IV BOLUS (SEPSIS): 1000.0000 mL | INTRAVENOUS | Status: DC | PRN

## 2013-08-21 MED ORDER — PHENYLEPHRINE 200 MCG/ML FOR PRIAPISM / HYPOTENSION
100.0000 ug | INTRAMUSCULAR | Status: AC | PRN
  Administered 2013-08-21 (×2): 100 ug via INTRAVENOUS
  Filled 2013-08-21: qty 50

## 2013-08-21 MED ORDER — DOBUTAMINE IN D5W 4-5 MG/ML-% IV SOLN
5.0000 ug/kg/min | INTRAVENOUS | Status: DC
  Administered 2013-08-21: 5 ug/kg/min via INTRAVENOUS
  Filled 2013-08-21: qty 250

## 2013-08-21 MED ORDER — DEXTROSE 5 % IV SOLN
2.0000 g | INTRAVENOUS | Status: DC
  Administered 2013-08-22: 2 g via INTRAVENOUS
  Filled 2013-08-21: qty 2

## 2013-08-21 MED ORDER — ZOLPIDEM TARTRATE 5 MG PO TABS
10.0000 mg | ORAL_TABLET | Freq: Every day | ORAL | Status: AC
Start: 2013-08-21 — End: 2013-08-21
  Administered 2013-08-21: 10 mg via ORAL
  Filled 2013-08-21: qty 2

## 2013-08-21 MED ORDER — PIPERACILLIN-TAZOBACTAM 3.375 G IVPB: 3.3750 g | Freq: Three times a day (TID) | INTRAVENOUS | Status: DC

## 2013-08-21 MED ORDER — SODIUM CHLORIDE 0.9 % IV SOLN
1000.0000 mL | INTRAVENOUS | Status: DC
  Administered 2013-08-21: 1000 mL via INTRAVENOUS

## 2013-08-21 MED ORDER — ONDANSETRON HCL 4 MG/2ML IJ SOLN
4.0000 mg | Freq: Once | INTRAMUSCULAR | Status: AC
  Administered 2013-08-21: 4 mg via INTRAVENOUS

## 2013-08-21 MED ORDER — SODIUM CHLORIDE 0.9 % IV BOLUS (SEPSIS)
1000.0000 mL | INTRAVENOUS | Status: DC | PRN
  Administered 2013-08-21: 1000 mL via INTRAVENOUS

## 2013-08-21 MED ORDER — LEVOFLOXACIN IN D5W 750 MG/150ML IV SOLN
750.0000 mg | INTRAVENOUS | Status: DC
  Administered 2013-08-21: 750 mg via INTRAVENOUS
  Filled 2013-08-21: qty 150

## 2013-08-21 MED ORDER — HEPARIN SODIUM (PORCINE) 5000 UNIT/ML IJ SOLN
5000.0000 [IU] | Freq: Three times a day (TID) | INTRAMUSCULAR | Status: DC
  Filled 2013-08-21 (×3): qty 1

## 2013-08-21 MED ORDER — VANCOMYCIN HCL 10 G IV SOLR
1500.0000 mg | INTRAVENOUS | Status: DC
  Administered 2013-08-22: 1500 mg via INTRAVENOUS
  Filled 2013-08-21: qty 1500

## 2013-08-21 MED ORDER — NOREPINEPHRINE BITARTRATE 1 MG/ML IJ SOLN
5.0000 ug/min | INTRAVENOUS | Status: DC
  Administered 2013-08-21: 6 ug/min via INTRAVENOUS
  Administered 2013-08-21: 8 ug/min via INTRAVENOUS
  Filled 2013-08-21 (×2): qty 4

## 2013-08-21 MED ORDER — PANTOPRAZOLE SODIUM 40 MG IV SOLR
40.0000 mg | INTRAVENOUS | Status: DC
  Administered 2013-08-21 – 2013-08-22 (×2): 40 mg via INTRAVENOUS
  Filled 2013-08-21 (×2): qty 40

## 2013-08-21 MED ORDER — VANCOMYCIN HCL 10 G IV SOLR
1750.0000 mg | Freq: Once | INTRAVENOUS | Status: AC
  Administered 2013-08-21: 1750 mg via INTRAVENOUS
  Filled 2013-08-21: qty 1750

## 2013-08-21 MED ORDER — PIPERACILLIN-TAZOBACTAM 3.375 G IVPB 30 MIN
3.3750 g | Freq: Once | INTRAVENOUS | Status: AC
  Administered 2013-08-21: 3.375 g via INTRAVENOUS
  Filled 2013-08-21: qty 50

## 2013-08-21 MED ORDER — SODIUM CHLORIDE 0.9 % IV SOLN
INTRAVENOUS | Status: DC
  Administered 2013-08-21: 100 mL/h via INTRAVENOUS

## 2013-08-21 MED ORDER — HYDROCORTISONE NA SUCCINATE PF 100 MG IJ SOLR
200.0000 mg | Freq: Once | INTRAMUSCULAR | Status: AC
  Administered 2013-08-21: 200 mg via INTRAVENOUS
  Filled 2013-08-21: qty 4

## 2013-08-21 MED ORDER — DEXTROSE 5 % IV SOLN
2.0000 g | Freq: Once | INTRAVENOUS | Status: AC
  Administered 2013-08-21: 2 g via INTRAVENOUS

## 2013-08-21 MED ORDER — ONDANSETRON HCL 4 MG/2ML IJ SOLN
INTRAMUSCULAR | Status: AC
  Filled 2013-08-21: qty 2

## 2013-08-21 MED ORDER — VANCOMYCIN HCL IN DEXTROSE 1-5 GM/200ML-% IV SOLN: 1000.0000 mg | Freq: Once | INTRAVENOUS | Status: DC

## 2013-08-21 MED ORDER — PHENYLEPHRINE 200 MCG/ML FOR PRIAPISM / HYPOTENSION
100.0000 ug | INTRAMUSCULAR | Status: AC | PRN
  Administered 2013-08-21 (×2): 100 ug via INTRAVENOUS

## 2013-08-21 MED ORDER — CITALOPRAM HYDROBROMIDE 20 MG PO TABS
20.0000 mg | ORAL_TABLET | Freq: Every day | ORAL | Status: DC
  Administered 2013-08-21 – 2013-08-23 (×3): 20 mg via ORAL
  Filled 2013-08-21 (×4): qty 1

## 2013-08-21 MED ORDER — HYDROCORTISONE SOD SUCCINATE 100 MG IJ SOLR
50.0000 mg | Freq: Four times a day (QID) | INTRAMUSCULAR | Status: DC
  Administered 2013-08-21 – 2013-08-24 (×12): 50 mg via INTRAVENOUS
  Filled 2013-08-21 (×16): qty 1

## 2013-08-21 MED ORDER — BUSPIRONE HCL 15 MG PO TABS
15.0000 mg | ORAL_TABLET | Freq: Two times a day (BID) | ORAL | Status: DC
  Administered 2013-08-21 – 2013-08-23 (×5): 15 mg via ORAL
  Filled 2013-08-21 (×7): qty 1

## 2013-08-21 MED ORDER — VANCOMYCIN HCL IN DEXTROSE 1-5 GM/200ML-% IV SOLN
1000.0000 mg | Freq: Once | INTRAVENOUS | Status: DC
  Filled 2013-08-21: qty 200

## 2013-08-21 MED ORDER — SODIUM CHLORIDE 0.9 % IV SOLN
1000.0000 mL | Freq: Once | INTRAVENOUS | Status: AC
  Administered 2013-08-21: 1000 mL via INTRAVENOUS

## 2013-08-21 MED ORDER — POTASSIUM CHLORIDE 10 MEQ/50ML IV SOLN
10.0000 meq | INTRAVENOUS | Status: AC
Start: 2013-08-21 — End: 2013-08-21
  Administered 2013-08-21 (×4): 10 meq via INTRAVENOUS
  Filled 2013-08-21 (×4): qty 50

## 2013-08-21 MED ORDER — INSULIN ASPART 100 UNIT/ML ~~LOC~~ SOLN
2.0000 [IU] | SUBCUTANEOUS | Status: DC
  Administered 2013-08-22 (×4): 2 [IU] via SUBCUTANEOUS
  Administered 2013-08-22: 4 [IU] via SUBCUTANEOUS
  Administered 2013-08-23 – 2013-08-24 (×7): 2 [IU] via SUBCUTANEOUS

## 2013-08-21 NOTE — Procedures (Signed)
Central Venous Catheter Insertion Procedure Note [name] [name] [id number] Jan 06, 1956  Procedure: Insertion of Central Venous Catheter Indications: Assessment of intravascular volume and Drug and/or fluid administration  Procedure Details Consent: Risks of procedure as well as the alternatives and risks of each were explained to the (patient/caregiver).  Consent for procedure obtained. Time Out: Verified patient identification, verified procedure, site/side was marked, verified correct patient position, special equipment/implants available, medications/allergies/relevent history reviewed, required imaging and test results available.  Performed  Maximum sterile technique was used including antiseptics, cap, gloves, gown, hand hygiene, mask and sheet. Skin prep: Chlorhexidine; local anesthetic administered A antimicrobial bonded/coated triple lumen catheter was placed in the left internal jugular vein using the Seldinger technique.  Evaluation Blood flow good Complications: No apparent complications Patient did tolerate procedure well. Chest X-ray ordered to verify placement.  CXR: pending.  U/S used in placement.  [name] [name] 09-15-13, 10:08 AM

## 2013-08-21 NOTE — Progress Notes (Signed)
UR Completed.  [name] [phone] 08/09/2013  

## 2013-08-21 NOTE — ED Notes (Signed)
Dr. Lynelle Doctor at bedside for ultrasound IV. 2 peripheral IV right arm.

## 2013-08-21 NOTE — ED Notes (Signed)
Critical care PA at bedside.  

## 2013-08-21 NOTE — Progress Notes (Signed)
ANTICOAGULATION CONSULT NOTE - Follow Up Consult  Pharmacy Consult for Heparin Indication: atrial fibrillation  Allergies  Allergen Reactions  . Amiodarone Shortness Of Breath    Pulmonary toxicity    Patient Measurements: Height: 5\' 8"  (172.7 cm) Weight: 242 lb (109.77 kg) IBW/kg (Calculated) : 68.4 Heparin Dosing Weight: 93 kg Vital Signs: Temp: 99.7 F (37.6 C) (02/18 1952) Temp src: Oral (02/18 1952) BP: 122/69 mmHg (02/18 2015) Pulse Rate: 95 (02/18 2030) Labs:  Recent Labs  08/14/2013 0750 08/18/2013 1005 08/17/2013 1057 08/11/2013 2000  HGB 10.0*  --   --   --   HCT 31.0*  --   --   --   PLT 295  --   --   --   APTT  --   --  41* 66*  HEPARINUNFRC  --   --  >2.20*  --   CREATININE 4.49*  --   --   --   TROPONINI  --  <0.30  --   --    Estimated Creatinine Clearance: 21.6 ml/min (by C-G formula based on Cr of 4.49).  Medications:  Heparin @ 1400 units/hr.   Assessment: 57 YOM switched from Xarelto to IV heparin for atrial fibrillation.   aPTT is 66 - therapeutic for low end of goal.   Goal of Therapy:  aPTT 66-102 seconds (will transition to heparin levels when correlates with aPTT Monitor platelets by anticoagulation protocol: Yes   Plan:  1. Continue heparin at 1400 units/hr. 2. Follow up aPTT in 8 hrs (ok to do with am labs) - will also evaluate daily HL at that time.   Link Snuffer, PharmD, BCPS Clinical Pharmacist [phone] 08/23/2013,8:58 PM

## 2013-08-21 NOTE — Progress Notes (Signed)
eLink Physician-Brief Progress Note Patient Name: [name] [name] DOB: February 11, 1956 MRN: [id number]  Date of Service  Sep 18, 2013   HPI/Events of Note   oliguric  eICU Interventions  Bolus saline 500cc now If no improvement will need to start milrinone vs dobutamine considering SvO2   Intervention Category Intermediate Interventions: Oliguria - evaluation and management  MCQUAID, DOUGLAS Sep 18, 2013, 6:07 PM

## 2013-08-21 NOTE — H&P (Signed)
Name: Joretta BachelorDavid C Mahnke MRN: [id number] DOB: 1956/05/07    ADMISSION DATE:  June 08, 2014 CONSULTATION DATE:  June 08, 2014  REFERRING MD :  Dr. Lynelle DoctorKnapp PRIMARY SERVICE: PCCM  CHIEF COMPLAINT:  SOB and hypotension.  BRIEF PATIENT DESCRIPTION: 58 year old male with extensive PMH and previous diagnosis of amiodarone induced pulmonary toxicity.  Patient was in normal state of health until day PTP where he started noticing increase WOB and feeling dizzy and SOB.  Patient came to the ED where he was found to have a SBP in the 60's.  Lactic acid was elevated.  Patient initially responded to IVF but then BP dropped again.  Patient is chronically on steroids.  SIGNIFICANT EVENTS / STUDIES:  2/18 Septic shock from PNA.  LINES / TUBES: L IJ TLC 2/18>>>  CULTURES: Blood 2/18>>> Urine 2/18>>> Sputum 2/18>>>  ANTIBIOTICS: Cefepime 2/18>>> Vanc 2/18>>> Levaquin 2/18>>>  PAST MEDICAL HISTORY :  Past Medical History  Diagnosis Date  . Hypertension   . Obesity     with a BMI of 35  . Gout   . GERD (gastroesophageal reflux disease)   . Depression   . ASHD (arteriosclerotic heart disease)     hx CABG 2005, patent grafts 04/2013  . Diabetes mellitus   . Hyperlipidemia   . Hypertriglyceridemia     TRIGS OVER 3000 IN THE PAST; MOST RECENT TRIGS OF 306, HDL 36, VLDL 61, LDL 89  . 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 History of tobacco abuse     PT QUIT 07/04/12  . OSA on CPAP   . Shortness of breath   . Nonischemic cardiomyopathy, ef 30-40% by echo 03/2013 down from 49%, was in atrial fib  at time of Echo 05/01/2013  . CKD (chronic kidney disease) stage 3, GFR 30-59 ml/min, recently was CKD stage 4 with diuressing. 05/01/2013  . Carotid disease, bilateral   . Persistent atrial fibrillation 04/18/2013    DCCV 11/2012; 04/2013  . Chronic anticoagulation 04/18/2013    xarelto  . Amiodarone pulmonary toxicity    Past Surgical History  Procedure Laterality Date  .  Appendectomy  1983  . Cardioversion N/A 11/14/2012    Procedure: CARDIOVERSION;  Surgeon: Chrystie NoseKenneth C. Hilty, MD;  [facility];  Service: Cardiovascular;  Laterality: N/A;  . Coronary artery bypass graft  05/11/04    X 4; HAS A VERY SHORT AORTA, ROUND DILATED HEART AS WELL AS A STERNAL ABNORMALITY   . Cardiac catheterization  11/18/10    GRAFTS WIDELY PATENT WITH NO HIGH-GRADE CAD  . Carotids  04/03/12    CAROTID DUPLEX; RIGHT BULB/PROXIMAL ICA: MILD AMT OF FIBROUS PLAQUE SLIGHTLY ELEVATING VELOCITIES WITHIN THE PROXIMAL SEGMENT OF THE INTERNAL CAROTID  ARTERY  . Nm myoview ltd  09/17/09    EF 49%; GLOBAL LV SYSTOLIC FUNCTION MILDLY REDUCED  . Cardiac catheterization  04/2013    patent grafts, also RHC done   Prior to Admission medications   Medication Sig Start Date End Date Taking? Authorizing Provider  acetaminophen (TYLENOL) 500 MG tablet Take 1,000 mg by mouth every 6 (six) hours as needed for pain.   Yes Historical Provider, MD  allopurinol (ZYLOPRIM) 300 MG tablet take 1 tablet by mouth once daily 09/22/12  Yes Ronnald NianJohn C Lalonde, MD  amLODipine (NORVASC) 5 MG tablet Take 5 mg by mouth daily.   Yes Historical Provider, MD  aspirin EC 81 MG tablet Take 81 mg by mouth at bedtime.  Yes Historical Provider, MD  atorvastatin (LIPITOR) 20 MG tablet Take 1 tablet (20 mg total) by mouth at bedtime. 06/13/13  Yes Nada Boozer, NP  busPIRone (BUSPAR) 15 MG tablet take 1 tablet by mouth twice a day AT 10AM AND 5PM 07/01/13  Yes Ronnald Nian, MD  cholecalciferol (VITAMIN D) 1000 UNITS tablet Take 1,000 Units by mouth daily.   Yes Historical Provider, MD  Choline Fenofibrate (TRILIPIX) 135 MG capsule Take 135 mg by mouth daily.   Yes Historical Provider, MD  citalopram (CELEXA) 20 MG tablet Take 20 mg by mouth daily.   Yes Historical Provider, MD  FORTESTA 10 MG/ACT (2%) GEL apply 4 PUMPS once daily to SKIN 03/16/13  Yes Ronnald Nian, MD  furosemide (LASIX) 40 MG tablet Take 40-60 mg by mouth  daily. Take 60mg  AM and take 40mg  PM 06/13/13  Yes Nada Boozer, NP  glucose blood test strip Use as instructed(THIS IS FOR THE ONETOUCH VERIO IQ ) 03/13/13  Yes Ronnald Nian, MD  hydrALAZINE (APRESOLINE) 50 MG tablet Take 25 mg by mouth 3 (three) times daily. Take 1/2 tab tid 05/25/13  Yes Historical Provider, MD  Insulin Glargine (LANTUS SOLOSTAR) 100 UNIT/ML Solostar Pen Inject 10 Units into the skin daily at 10 pm. 07/23/13  Yes Ronnald Nian, MD  Insulin Pen Needle (BD ULTRA-FINE PEN NEEDLES) 29G X 12.7MM MISC Use daily 07/24/13  Yes Ronnald Nian, MD  Lansoprazole (PREVACID PO) Take 1 capsule by mouth at bedtime.   Yes Historical Provider, MD  levalbuterol [facility] HFA) 45 MCG/ACT inhaler Inhale 1-2 puffs into the lungs every 4 (four) hours as needed for wheezing. 10/15/12  Yes Ronnald Nian, MD  LOVAZA 1 G capsule take 4 capsules by mouth daily 07/01/13  Yes Ronnald Nian, MD  metoprolol tartrate (LOPRESSOR) 50 MG tablet Take 1 tablet (50 mg total) by mouth 2 (two) times daily. 07/24/13  Yes Hillis Range, MD  mometasone (NASONEX) 50 MCG/ACT nasal spray Place 2 sprays into the nose daily. 07/17/13  Yes [name], MD  niacin 500 MG CR capsule Take 1 capsule (500 mg total) by mouth at bedtime. 06/13/13  Yes Nada Boozer, NP  OXYGEN-HELIUM IN Inhale 3 L into the lungs continuous.   Yes Historical Provider, MD  potassium chloride SA (K-DUR,KLOR-CON) 20 MEQ tablet Take 2 tablets (40 mEq total) by mouth daily. 06/13/13  Yes Nada Boozer, NP  predniSONE (DELTASONE) 20 MG tablet Take 1 tablet (20 mg total) by mouth daily with breakfast. 08/02/13  Yes Kalman Shan, MD  Rivaroxaban (XARELTO) 15 MG TABS tablet Take 15 mg by mouth daily.   Yes Historical Provider, MD   Allergies  Allergen Reactions  . Amiodarone Shortness Of Breath    Pulmonary toxicity    FAMILY HISTORY:  Family History  Problem Relation Age of Onset  . Heart disease Father   . Cancer Father 63    LUNG  .  Hypertension Father   . Diabetes Brother   . Stroke Maternal Grandmother 60  . Heart disease Paternal Grandmother    SOCIAL HISTORY:  reports that he quit smoking about 13 months ago. His smoking use included Cigarettes. He has a 40 pack-year smoking history. He has never used smokeless tobacco. He reports that he does not drink alcohol or use illicit drugs.  REVIEW OF SYSTEMS:  Negative other than mentioned above.  SUBJECTIVE: SOB and dizziness.  VITAL SIGNS: Temp:  [96.5 F (35.8 C)] 96.5 F (35.8  C) (02/18 0931) Pulse Rate:  [32-96] 32 (02/18 0915) Resp:  [16-27] 27 (02/18 0930) BP: (63-89)/(33-66) 76/50 mmHg (02/18 0955) SpO2:  [95 %-100 %] 95 % (02/18 0915) HEMODYNAMICS:   VENTILATOR SETTINGS:   INTAKE / OUTPUT: Intake/Output   None     PHYSICAL EXAMINATION: General:  Chronically ill appearing obese male. Neuro:  Alert and interactive, moving all ext to command.   HEENT:  [place]/AT, PERRL, EOM-I and DMM. Cardiovascular:  IRIR, Nl S1/S2, -M/R/G. Lungs:  Decreased at the bases, L>R crackles. Abdomen:  Soft, NT, ND and +BS. Musculoskeletal:  Intact. Skin:  Intact.   LABS:  CBC  Recent Labs Lab 08/27/13 0750  WBC 13.8*  HGB 10.0*  HCT 31.0*  PLT 295   Coag's No results found for this basename: APTT, INR,  in the last 168 hours BMET  Recent Labs Lab 27-Aug-2013 0750  NA 135*  K 3.8  CL 99  CO2 14*  BUN 66*  CREATININE 4.49*  GLUCOSE 111*   Electrolytes  Recent Labs Lab Aug 27, 2013 0750  CALCIUM 9.0   Sepsis Markers  Recent Labs Lab 2013/08/27 8250  LATICACIDVEN 5.53*   ABG  Recent Labs Lab 08/27/2013 0813  PHART 7.444  PCO2ART 22.2*  PO2ART 102.0*   Liver Enzymes No results found for this basename: AST, ALT, ALKPHOS, BILITOT, ALBUMIN,  in the last 168 hours Cardiac Enzymes  Recent Labs Lab 08/27/13 0750  PROBNP 11418.0*   Glucose No results found for this basename: GLUCAP,  in the last 168 hours  Imaging Dg Chest Port 1  View  August 27, 2013   CLINICAL DATA:  Shortness of breath, history CHF, hypertension, diabetes, hyperlipidemia, former smoker  EXAM: PORTABLE CHEST - 1 VIEW  COMPARISON:  Portable exam 0758 hr compared to 07/09/2013  FINDINGS: Enlargement of cardiac silhouette post CABG.  Minimal atherosclerotic calcification aortic arch.  Pulmonary vascularity normal.  Poor definition of the left heart border cannot exclude mild lingular infiltrate.  Remaining lungs clear.  No gross pleural effusion or pneumothorax.  IMPRESSION: Enlargement of cardiac silhouette post CABG.  Indistinct left heart border, cannot exclude mild lingular infiltrate.   Electronically Signed   By: Ulyses Southward M.D.   On: Aug 27, 2013 08:07     CXR: LLL infiltrate.  ASSESSMENT / PLAN:  PULMONARY A: Hypoxic respiratory failure with LLL infiltrate (PNA) and OSA. P:   - Supplemental O2 as needed. - Pulmonary hygienes. - CPAP when asleep (on home CPAP). - DNI per patient's request.  CARDIOVASCULAR A: Septic shock, a-fib on xeralto. P:  - Sepsis protocol. - Heparin drip. - See ID section.  RENAL A:  Acute on chronic renal failure and hypokalemia. P:   - Replace K. - Hydrate. - Monitor. - Replace electrolytes as needed.  GASTROINTESTINAL A:  Abdominal pain, ?diarrhea. P:   - C. Diff. - Diabetic diet. - Isolation.  HEMATOLOGIC A:  Leukocytosis. P:  - Monitor CBC.  INFECTIOUS A:  LLL PNA P:   - Vanc/cefepime/levaquin. - Pan culture. - Narrow as able.  ENDOCRINE A:  DM.  Chronic steroids. P:   - CBG and ISS as ordered. - Stress dose steroids.  NEUROLOGIC A:  Intermittent confusion when hypotensive. P:   - Monitor.  TODAY'S SUMMARY: 58 year old male with extensive PMH presenting with LLL PNA and septic shock.  R/O C.diff.  Spoke with patient and partner.  Wish for no CPR/cardioversion/intubation.  CPAP while in bed.  I have personally obtained a history, examined the patient, evaluated  laboratory and imaging  results, formulated the assessment and plan and placed orders. CRITICAL CARE: The patient is critically ill with multiple organ systems failure and requires high complexity decision making for assessment and support, frequent evaluation and titration of therapies, application of advanced monitoring technologies and extensive interpretation of multiple databases. Critical Care Time devoted to patient care services described in this note is 45 minutes.   Alyson Reedy, M.D. [facility] Pulmonary/Critical Care Medicine. Pager: [phone]. After hours pager: [phone].

## 2013-08-21 NOTE — Progress Notes (Signed)
Felix Pacini, DO on floor and made aware that pt has only had 25cc of urine output since foley being placed earlier on day shift. Bladder scan shows no urine in bladder. Pt has dyspnea at rest and exertion. RR 25-30. Lung sounds diminished and pt is currently on 6 liters [place]. Pt states he feels better breathing when sitting up in the chair. New orders received. Will continue to monitor.

## 2013-08-21 NOTE — ED Notes (Signed)
Pt had runny diarrhea, small amount. Not sufficient enough for stool sample.

## 2013-08-21 NOTE — ED Notes (Signed)
Per EMS- Pt reporting feel bad for several days reports increasing SOB over past 2 hours. Denies any CP, reports hx of MI. Is SOB at rest, diarrhea since this morning. EKG showing LBBB. BP initially 106/88, HR 116. He was orthostatic when sitting up.

## 2013-08-21 NOTE — Progress Notes (Addendum)
ANTIBIOTIC/Anticoagulant CONSULT NOTE - INITIAL  Pharmacy Consult for vancomycin and zosyn, now change to cefepime and levaquin, heparin Indication: rule out sepsis, afib  Allergies  Allergen Reactions  . Amiodarone Shortness Of Breath    Pulmonary toxicity    Patient Measurements:   Body Weight: 106.6 kg  Vital Signs: BP: 63/33 mmHg (02/18 0820) Pulse Rate: 96 (02/18 0820) Intake/Output from previous day:   Intake/Output from this shift:    Labs: No results found for this basename: WBC, HGB, PLT, LABCREA, CREATININE,  in the last 72 hours The CrCl is unknown because both a height and weight (above a minimum accepted value) are required for this calculation. No results found for this basename: VANCOTROUGH, VANCOPEAK, VANCORANDOM, GENTTROUGH, GENTPEAK, GENTRANDOM, TOBRATROUGH, TOBRAPEAK, TOBRARND, AMIKACINPEAK, AMIKACINTROU, AMIKACIN,  in the last 72 hours   Microbiology: No results found for this or any previous visit (from the past 720 hour(s)).  Medical History: Past Medical History  Diagnosis Date  . Hypertension   . Obesity     with a BMI of 35  . Gout   . GERD (gastroesophageal reflux disease)   . Depression   . ASHD (arteriosclerotic heart disease)     hx CABG 2005, patent grafts 04/2013  . Diabetes mellitus   . Hyperlipidemia   . Hypertriglyceridemia     TRIGS OVER 3000 IN THE PAST; MOST RECENT TRIGS OF 306, HDL 36, VLDL 61, LDL 89  . 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 History of tobacco abuse     PT QUIT 07/04/12  . OSA on CPAP   . Shortness of breath   . Nonischemic cardiomyopathy, ef 30-40% by echo 03/2013 down from 49%, was in atrial fib  at time of Echo 05/01/2013  . CKD (chronic kidney disease) stage 3, GFR 30-59 ml/min, recently was CKD stage 4 with diuressing. 05/01/2013  . Carotid disease, bilateral   . Persistent atrial fibrillation 04/18/2013    DCCV 11/2012; 04/2013  . Chronic anticoagulation 04/18/2013    xarelto  .  Amiodarone pulmonary toxicity     Medications:  Scheduled:  . hydrocortisone sodium succinate  200 mg Intravenous Once   Assessment: 58 yo man to start broad spectrum antibiotics for sepsis. His SrCr is 2.36 and CrCl ~35 ml/min.  He has orders for zosyn 3.375 gm IV X 1 in the ED and 1 gm vancomycin.  Cefepime 2 gm IV ordered in ED and zosyn changed to cefepime.  Will add levaquin as well.  Start heparin for afib.  He was on xarelto 15 mg po daily PTA with last dose 2/17 around noon per pt.    Goal of Therapy:  Vancomycin trough level 15-20 mcg/ml APTT 66-102, Heparin level 0.3-0.7 when correlates with aPTT  Plan:  Change vanc to 1750 mg IV X 1 then 1500 mg IV q24 hours Levaquin 750 mg IV q48 hours Cefepime 2 gm IV q24 hours Check baseline HL and aPTT. Start heparin drip at noon at 1400 units/hr with no bolus Check aPTT and heparin level 8 hours after start and daily until aPTT correlates with heparin level F/u renal function, cultures and clinical progress.Monitor for bleeding  Thanks for allowing pharmacy to be a part of this patient's care.  Talbert Cage, PharmD Clinical Pharmacist, [phone] 08-28-2013,8:36 AM

## 2013-08-21 NOTE — Progress Notes (Signed)
Updated MD on lactic, ScVO2, and cdiff results. Also noted that pt has put out 10cc in 4 hrs via foley, creatinine 4.49 and bladder scan was 0. Bolus was ordered.

## 2013-08-21 NOTE — ED Notes (Signed)
EDMD at bedside

## 2013-08-21 NOTE — ED Notes (Signed)
Critical care at bedside for central line placement. Consent obtained from patients partner.

## 2013-08-21 NOTE — ED Provider Notes (Signed)
CSN: [id number]     Arrival date & time 08/23/2013  3244 History   First MD Initiated Contact with Patient 08/20/2013 [phone]     Chief Complaint  Patient presents with  . Shortness of Breath   HPI Patient presents to the emergency room with complaints of worsening shortness of breath and weakness. Patient has multiple medical problems as listed in his past medical history. Recently he has been having issues with shortness of breath felt to be related to amiodarone pulmonary toxicity. The patient has been on a prednisone taper and had a CT scan of his chest last month that showed an interstitial inflammatory process. Patient states in the last few days he has become more short of breath. He continues on his home oxygen and his saturations have remained in the high 80s to mid 90s. The symptoms increase whenever he tries to exert himself. Over the last day or 2 he started to lightheaded.   Symptoms increase whenever he tries to stand up. He denies any chest pain or abdominal pain. He has had a few loose stools starting this morning.  He denies any vomiting.  Past Medical History  Diagnosis Date  . Hypertension   . Obesity     with a BMI of 35  . Gout   . GERD (gastroesophageal reflux disease)   . Depression   . ASHD (arteriosclerotic heart disease)     hx CABG 2005, patent grafts 04/2013  . Diabetes mellitus   . Hyperlipidemia   . Hypertriglyceridemia     TRIGS OVER 3000 IN THE PAST; MOST RECENT TRIGS OF 306, HDL 36, VLDL 61, LDL 89  . 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 History of tobacco abuse     PT QUIT 07/04/12  . OSA on CPAP   . Shortness of breath   . Nonischemic cardiomyopathy, ef 30-40% by echo 03/2013 down from 49%, was in atrial fib  at time of Echo 05/01/2013  . CKD (chronic kidney disease) stage 3, GFR 30-59 ml/min, recently was CKD stage 4 with diuressing. 05/01/2013  . Carotid disease, bilateral   . Persistent atrial fibrillation 04/18/2013    DCCV 11/2012;  04/2013  . Chronic anticoagulation 04/18/2013    xarelto  . Amiodarone pulmonary toxicity    Past Surgical History  Procedure Laterality Date  . Appendectomy  1983  . Cardioversion N/A 11/14/2012    Procedure: CARDIOVERSION;  Surgeon: [name], MD;  Location: [facility] ENDOSCOPY;  Service: Cardiovascular;  Laterality: N/A;  . Coronary artery bypass graft  05/11/04    X 4; HAS A VERY SHORT AORTA, ROUND DILATED HEART AS WELL AS A STERNAL ABNORMALITY   . Cardiac catheterization  11/18/10    GRAFTS WIDELY PATENT WITH NO HIGH-GRADE CAD  . Carotids  04/03/12    CAROTID DUPLEX; RIGHT BULB/PROXIMAL ICA: MILD AMT OF FIBROUS PLAQUE SLIGHTLY ELEVATING VELOCITIES WITHIN THE PROXIMAL SEGMENT OF THE INTERNAL CAROTID  ARTERY  . Nm myoview ltd  09/17/09    EF 49%; GLOBAL LV SYSTOLIC FUNCTION MILDLY REDUCED  . Cardiac catheterization  04/2013    patent grafts, also RHC done   Family History  Problem Relation Age of Onset  . Heart disease Father   . Cancer Father 89    LUNG  . Hypertension Father   . Diabetes Brother   . Stroke Maternal Grandmother 60  . Heart disease Paternal Grandmother    History  Substance Use Topics  .  Smoking status: Former Smoker -- 1.00 packs/day for 40 years    Types: Cigarettes    Quit date: 07/04/2012  . Smokeless tobacco: Never Used  . 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      Allergies  Amiodarone  Home Medications   Current Outpatient Rx  Name  Route  Sig  Dispense  Refill  . acetaminophen (TYLENOL) 500 MG tablet   Oral   Take 1,000 mg by mouth every 6 (six) hours as needed for pain.         Marland Kitchen. allopurinol (ZYLOPRIM) 300 MG tablet      take 1 tablet by mouth once daily   30 tablet   12   . amLODipine (NORVASC) 5 MG tablet   Oral   Take 5 mg by mouth daily.         Marland Kitchen. aspirin EC 81 MG tablet   Oral   Take 81 mg by mouth at bedtime.         Marland Kitchen.  atorvastatin (LIPITOR) 20 MG tablet   Oral   Take 1 tablet (20 mg total) by mouth at bedtime.   30 tablet   6   . busPIRone (BUSPAR) 15 MG tablet      take 1 tablet by mouth twice a day AT 10AM AND 5PM   60 tablet   5   . cholecalciferol (VITAMIN D) 1000 UNITS tablet   Oral   Take 1,000 Units by mouth daily.         . Choline Fenofibrate (TRILIPIX) 135 MG capsule   Oral   Take 135 mg by mouth daily.         . citalopram (CELEXA) 20 MG tablet   Oral   Take 20 mg by mouth daily.         Marland Kitchen. FORTESTA 10 MG/ACT (2%) GEL      apply 4 PUMPS once daily to SKIN   60 g   5   . furosemide (LASIX) 40 MG tablet   Oral   Take 40-60 mg by mouth daily. Take 60mg  AM and take 40mg  PM         . glucose blood test strip      Use as instructed(THIS IS FOR THE ONETOUCH VERIO IQ )   100 each   PRN   . hydrALAZINE (APRESOLINE) 50 MG tablet   Oral   Take 25 mg by mouth 3 (three) times daily. Take 1/2 tab tid         . Insulin Glargine (LANTUS SOLOSTAR) 100 UNIT/ML Solostar Pen   Subcutaneous   Inject 10 Units into the skin daily at 10 pm.   5 pen   PRN   . Insulin Pen Needle (BD ULTRA-FINE PEN NEEDLES) 29G X 12.7MM MISC      Use daily   100 each   3   . Lansoprazole (PREVACID PO)   Oral   Take 1 capsule by mouth at bedtime.         . levalbuterol (XOPENEX HFA) 45 MCG/ACT inhaler   Inhalation   Inhale 1-2 puffs into the lungs every 4 (four) hours as needed for wheezing.   1 Inhaler   12   . LOVAZA 1 G capsule      take 4 capsules by mouth daily   120 capsule   11   .  metoprolol tartrate (LOPRESSOR) 50 MG tablet   Oral   Take 1 tablet (50 mg total) by mouth 2 (two) times daily.   180 tablet   3   . mometasone (NASONEX) 50 MCG/ACT nasal spray   Nasal   Place 2 sprays into the nose daily.   17 g   1     Alternate nasal spray with a nasal saline spray.   . niacin 500 MG CR capsule   Oral   Take 1 capsule (500 mg total) by mouth at bedtime.   30  capsule   6   . OXYGEN-HELIUM IN   Inhalation   Inhale 3 L into the lungs continuous.         . potassium chloride SA (K-DUR,KLOR-CON) 20 MEQ tablet   Oral   Take 2 tablets (40 mEq total) by mouth daily.   60 tablet   6   . predniSONE (DELTASONE) 20 MG tablet   Oral   Take 1 tablet (20 mg total) by mouth daily with breakfast.   30 tablet   6   . Rivaroxaban (XARELTO) 15 MG TABS tablet   Oral   Take 15 mg by mouth daily.          BP 76/51  Pulse 59  Resp 20  SpO2 100% Physical Exam  Nursing note and vitals reviewed. Constitutional:  Obese  HENT:  Head: Normocephalic and atraumatic.  Right Ear: External ear normal.  Left Ear: External ear normal.  Mouth/Throat: No oropharyngeal exudate.  Eyes: Conjunctivae and EOM are normal. Right eye exhibits no discharge. Left eye exhibits no discharge. No scleral icterus.  Neck: Neck supple. No tracheal deviation present.  Cardiovascular: Regular rhythm and intact distal pulses.  Tachycardia present.   Pulmonary/Chest: Breath sounds normal. Accessory muscle usage present. No stridor. Tachypnea noted. No respiratory distress. He has no wheezes. He has no rales.  Abdominal: Soft. Bowel sounds are normal. He exhibits no distension. There is no tenderness. There is no rebound and no guarding.  Musculoskeletal: He exhibits no edema and no tenderness.  Neurological: He is alert. He has normal strength. No cranial nerve deficit (no facial droop, extraocular movements intact, no slurred speech) or sensory deficit. He exhibits normal muscle tone. He displays no seizure activity. Coordination normal.  Skin: Skin is warm and dry. No rash noted. He is not diaphoretic.  Psychiatric: He has a normal mood and affect.    ED Course  CRITICAL CARE Performed by: Linwood Dibbles R Authorized by: Linwood Dibbles R Total critical care time: 40 minutes Critical care was necessary to treat or prevent imminent or life-threatening deterioration of the  following conditions: circulatory failure, endocrine crisis, sepsis and shock. Critical care was time spent personally by me on the following activities: development of treatment plan with patient or surrogate, discussions with consultants, evaluation of patient's response to treatment, examination of patient, ordering and performing treatments and interventions, ordering and review of laboratory studies, ordering and review of radiographic studies, pulse oximetry, re-evaluation of patient's condition and review of old charts.    Labs Review Labs Reviewed  CBC - Abnormal; Notable for the following:    WBC 13.8 (*)    RBC 3.33 (*)    Hemoglobin 10.0 (*)    HCT 31.0 (*)    RDW 16.2 (*)    All other components within normal limits  POCT I-STAT 3, BLOOD GAS (G3+) - Abnormal; Notable for the following:    pCO2 arterial 22.2 (*)  pO2, Arterial 102.0 (*)    Bicarbonate 15.4 (*)    Acid-base deficit 7.0 (*)    All other components within normal limits  CG4 I-STAT (LACTIC ACID) - Abnormal; Notable for the following:    Lactic Acid, Venous 5.53 (*)    All other components within normal limits  CULTURE, BLOOD (ROUTINE X 2)  CULTURE, BLOOD (ROUTINE X 2)  URINE CULTURE  CLOSTRIDIUM DIFFICILE BY PCR  BASIC METABOLIC PANEL  PRO B NATRIURETIC PEPTIDE  URINALYSIS, ROUTINE W REFLEX MICROSCOPIC  POCT I-STAT TROPONIN I  TYPE AND SCREEN   Imaging Review Dg Chest Port 1 View  08/07/2013   CLINICAL DATA:  Shortness of breath, history CHF, hypertension, diabetes, hyperlipidemia, former smoker  EXAM: PORTABLE CHEST - 1 VIEW  COMPARISON:  Portable exam 0758 hr compared to 07/09/2013  FINDINGS: Enlargement of cardiac silhouette post CABG.  Minimal atherosclerotic calcification aortic arch.  Pulmonary vascularity normal.  Poor definition of the left heart border cannot exclude mild lingular infiltrate.  Remaining lungs clear.  No gross pleural effusion or pneumothorax.  IMPRESSION: Enlargement of cardiac  silhouette post CABG.  Indistinct left heart border, cannot exclude mild lingular infiltrate.   Electronically Signed   By: Ulyses Southward M.D.   On: 08/08/2013 08:07    EKG Interpretation    Date/Time:  Wednesday August 21 2013 07:41:17 EST Ventricular Rate:  121 PR Interval:    QRS Duration: 121 QT Interval:  335 QTC Calculation: 475 R Axis:   0 Text Interpretation:  Atrial fibrillation Nonspecific intraventricular conduction delay Abnormal lateral Q waves Anterior infarct, old Nonspecific repol abnormality, diffuse leads atrial fibrillation is new since last tracing Confirmed by Anberlin Diez  MD-J, Morghan Kester ([id number]) on 08/31/2013 8:28:34 AM            MDM   Final diagnoses:  Shock circulatory  Sepsis  Acute renal failure    0805  Initial vitals, pt is hypotensive which is causing his lightheadedness and contributing to his shortness of breath.  Denies any rectal bleeding.    7544 Attempted additoinal IV access unsuccessfully.  Pt does have 2 IVs that are working well. Persistent low BP.  Will give dose of phenylephrine.  Lactic acid elevated.  Will start code sepsis.  Discussed with Dr Katina Degree, critical care.  Recommended steroids, fluids.  Will recheck 30 minutes.  0847  Pt has had his steroids decreased.  Hydrocortisone IV ordered.  Additional fluid bolus increasing.   0930  BP is improving slightly with phenylephrine, IV fluids and steroids.  Empiric abx given.  Differential still includes sepsis as well as addisonian crisis related to steroid taper.  Doubt cardiac etiology at this point.   LAbs do show acute renal failure as well.  Continue IV fluid hydration.  Overall, sepsis is most likely.  Critical care team is in the ED evaluating the patient for admission.  Celene Kras, MD 08/06/2013 [phone]

## 2013-08-21 NOTE — ED Notes (Signed)
Have not been able to obtain oral temp, will not read. Warm blankets on patient. Will pain for rectal temp.

## 2013-08-21 NOTE — Progress Notes (Signed)
eLink Physician-Brief Progress Note Patient Name: KALVEN ARGENT DOB: 1955-07-05 MRN: [id number]  Date of Service  2013/09/17   HPI/Events of Note   Source: HCAP Abx: Vanc/Cefepime/Levaquin Hemodynamics:  Filed Vitals:   Sep 17, 2013 1415 2013-09-17 1430 09-17-13 1445 09-17-13 1500  BP:  113/88 128/102 90/51  Pulse: 91 100 80 106  Temp:      TempSrc:      Resp: 21 24 23 23   Height:      Weight:      SpO2: 100% 100% 100% 100%    CVP:  [16 mmHg] 16 mmHg Vasopressors: levophed 47mcg/min Lactic acid: last value 6 (going up) Coox: last value 41% Stress dose steroids: yes    eICU Interventions  Repeat Coox, lactic acid May need a-line Discussed with bedside RN   Intervention Category Major Interventions: Sepsis - evaluation and management;Shock - evaluation and management  Spencer Peterkin 17-Sep-2013, 4:13 PM

## 2013-08-21 NOTE — ED Notes (Signed)
Phlebotomy obtained blood cultures x 2.

## 2013-08-21 NOTE — ED Notes (Signed)
Pt is alert, remains SOB. Has emesis x 1. Family member at bedside.

## 2013-08-21 NOTE — ED Notes (Signed)
Lactic acid results given to Dr. J. Knapp 

## 2013-08-21 NOTE — Progress Notes (Signed)
Patient has home CPAP unit.  Patient stated that he will place on when he is ready.

## 2013-08-21 NOTE — ED Notes (Signed)
Critical care coming down to see patient. Pt reports nausea has improved. Pt is alert, is restless in bed, repositioned. Have not obtained stool sample as of yet. Contact isolation initiated.

## 2013-08-21 NOTE — Progress Notes (Signed)
eLink Physician-Brief Progress Note Patient Name: [name] [name] DOB: 02-Sep-1955 MRN: [id number]  Date of Service  08/23/2013   HPI/Events of Note   Lactic acid down trending but still elevated, BP improving  eICU Interventions  Continue current intervention Repeat lactic acid 2200   Intervention Category Major Interventions: Sepsis - evaluation and management  MCQUAID, DOUGLAS 08/30/2013, 5:49 PM

## 2013-08-21 NOTE — ED Notes (Signed)
Pharmacy is working on levophed drip.

## 2013-08-22 ENCOUNTER — Inpatient Hospital Stay (HOSPITAL_COMMUNITY): Payer: Managed Care, Other (non HMO)

## 2013-08-22 DIAGNOSIS — I4891 Unspecified atrial fibrillation: Secondary | ICD-10-CM

## 2013-08-22 DIAGNOSIS — I5023 Acute on chronic systolic (congestive) heart failure: Secondary | ICD-10-CM

## 2013-08-22 DIAGNOSIS — N179 Acute kidney failure, unspecified: Secondary | ICD-10-CM

## 2013-08-22 DIAGNOSIS — R579 Shock, unspecified: Secondary | ICD-10-CM

## 2013-08-22 DIAGNOSIS — J810 Acute pulmonary edema: Secondary | ICD-10-CM

## 2013-08-22 LAB — CBC WITH DIFFERENTIAL/PLATELET
BASOS ABS: 0 10*3/uL (ref 0.0–0.1)
BASOS PCT: 0 % (ref 0–1)
Eosinophils Absolute: 0 10*3/uL (ref 0.0–0.7)
Eosinophils Relative: 0 % (ref 0–5)
HEMATOCRIT: 24.2 % — AB (ref 39.0–52.0)
HEMOGLOBIN: 7.6 g/dL — AB (ref 13.0–17.0)
LYMPHS ABS: 1 10*3/uL (ref 0.7–4.0)
Lymphocytes Relative: 5 % — ABNORMAL LOW (ref 12–46)
MCH: 29.2 pg (ref 26.0–34.0)
MCHC: 31.4 g/dL (ref 30.0–36.0)
MCV: 93.1 fL (ref 78.0–100.0)
MONO ABS: 0.6 10*3/uL (ref 0.1–1.0)
Monocytes Relative: 3 % (ref 3–12)
Neutro Abs: 18.2 10*3/uL — ABNORMAL HIGH (ref 1.7–7.7)
Neutrophils Relative %: 92 % — ABNORMAL HIGH (ref 43–77)
Platelets: 120 10*3/uL — ABNORMAL LOW (ref 150–400)
RBC: 2.6 MIL/uL — ABNORMAL LOW (ref 4.22–5.81)
RDW: 16.6 % — AB (ref 11.5–15.5)
WBC Morphology: INCREASED
WBC: 19.8 10*3/uL — ABNORMAL HIGH (ref 4.0–10.5)

## 2013-08-22 LAB — PHOSPHORUS: Phosphorus: 8.6 mg/dL — ABNORMAL HIGH (ref 2.3–4.6)

## 2013-08-22 LAB — CARBOXYHEMOGLOBIN
Carboxyhemoglobin: 2 % — ABNORMAL HIGH (ref 0.5–1.5)
Carboxyhemoglobin: 2.3 % — ABNORMAL HIGH (ref 0.5–1.5)
METHEMOGLOBIN: 1.8 % — AB (ref 0.0–1.5)
Methemoglobin: 0.8 % (ref 0.0–1.5)
O2 SAT: 67 %
O2 Saturation: 56.4 %
TOTAL HEMOGLOBIN: 8 g/dL — AB (ref 13.5–18.0)
TOTAL HEMOGLOBIN: 8.1 g/dL — AB (ref 13.5–18.0)

## 2013-08-22 LAB — GLUCOSE, CAPILLARY
GLUCOSE-CAPILLARY: 121 mg/dL — AB (ref 70–99)
GLUCOSE-CAPILLARY: 144 mg/dL — AB (ref 70–99)
GLUCOSE-CAPILLARY: 136 mg/dL — AB (ref 70–99)
Glucose-Capillary: 111 mg/dL — ABNORMAL HIGH (ref 70–99)
Glucose-Capillary: 148 mg/dL — ABNORMAL HIGH (ref 70–99)
Glucose-Capillary: 195 mg/dL — ABNORMAL HIGH (ref 70–99)

## 2013-08-22 LAB — CBC
HEMATOCRIT: 23.9 % — AB (ref 39.0–52.0)
HEMOGLOBIN: 7.8 g/dL — AB (ref 13.0–17.0)
MCH: 30.4 pg (ref 26.0–34.0)
MCHC: 32.6 g/dL (ref 30.0–36.0)
MCV: 93 fL (ref 78.0–100.0)
Platelets: 86 10*3/uL — ABNORMAL LOW (ref 150–400)
RBC: 2.57 MIL/uL — ABNORMAL LOW (ref 4.22–5.81)
RDW: 16.7 % — AB (ref 11.5–15.5)
WBC: 18.7 10*3/uL — AB (ref 4.0–10.5)

## 2013-08-22 LAB — APTT
aPTT: 55 seconds — ABNORMAL HIGH (ref 24–37)
aPTT: 75 seconds — ABNORMAL HIGH (ref 24–37)

## 2013-08-22 LAB — RENAL FUNCTION PANEL
ALBUMIN: 2.4 g/dL — AB (ref 3.5–5.2)
BUN: 81 mg/dL — AB (ref 6–23)
CALCIUM: 6.9 mg/dL — AB (ref 8.4–10.5)
CO2: 12 mEq/L — ABNORMAL LOW (ref 19–32)
CREATININE: 6.64 mg/dL — AB (ref 0.50–1.35)
Chloride: 97 mEq/L (ref 96–112)
GFR calc Af Amer: 10 mL/min — ABNORMAL LOW (ref >90)
GFR, EST NON AFRICAN AMERICAN: 8 mL/min — AB (ref >90)
Glucose, Bld: 159 mg/dL — ABNORMAL HIGH (ref 70–99)
PHOSPHORUS: 9.9 mg/dL — AB (ref 2.3–4.6)
Potassium: 4.9 mEq/L (ref 3.7–5.3)
Sodium: 133 mEq/L — ABNORMAL LOW (ref 137–147)

## 2013-08-22 LAB — COMPREHENSIVE METABOLIC PANEL
ALBUMIN: 2.5 g/dL — AB (ref 3.5–5.2)
ALK PHOS: 44 U/L (ref 39–117)
ALT: 2100 U/L — ABNORMAL HIGH (ref 0–53)
AST: 5776 U/L — ABNORMAL HIGH (ref 0–37)
BUN: 74 mg/dL — AB (ref 6–23)
CALCIUM: 7.1 mg/dL — AB (ref 8.4–10.5)
CO2: 13 mEq/L — ABNORMAL LOW (ref 19–32)
CREATININE: 6.07 mg/dL — AB (ref 0.50–1.35)
Chloride: 101 mEq/L (ref 96–112)
GFR calc non Af Amer: 9 mL/min — ABNORMAL LOW (ref >90)
GFR, EST AFRICAN AMERICAN: 11 mL/min — AB (ref >90)
GLUCOSE: 130 mg/dL — AB (ref 70–99)
POTASSIUM: 5.5 meq/L — AB (ref 3.7–5.3)
Sodium: 138 mEq/L (ref 137–147)
TOTAL PROTEIN: 6 g/dL (ref 6.0–8.3)
Total Bilirubin: 1.1 mg/dL (ref 0.3–1.2)

## 2013-08-22 LAB — HEPARIN LEVEL (UNFRACTIONATED): HEPARIN UNFRACTIONATED: 1.54 [IU]/mL — AB (ref 0.30–0.70)

## 2013-08-22 LAB — HEPATITIS PANEL, ACUTE
HCV Ab: NEGATIVE
HEP B C IGM: NONREACTIVE
HEP B S AG: NEGATIVE
Hep A IgM: NONREACTIVE

## 2013-08-22 LAB — TROPONIN I: Troponin I: 0.41 ng/mL (ref <0.30)

## 2013-08-22 LAB — MAGNESIUM: Magnesium: 1.1 mg/dL — ABNORMAL LOW (ref 1.5–2.5)

## 2013-08-22 MED ORDER — HEPARIN (PORCINE) 2000 UNITS/L FOR CRRT
INTRAVENOUS_CENTRAL | Status: DC | PRN
  Filled 2013-08-22: qty 1000

## 2013-08-22 MED ORDER — FUROSEMIDE 10 MG/ML IJ SOLN
80.0000 mg | Freq: Two times a day (BID) | INTRAMUSCULAR | Status: DC
  Administered 2013-08-22: 80 mg via INTRAVENOUS
  Filled 2013-08-22: qty 8

## 2013-08-22 MED ORDER — PRISMASOL BGK 4/2.5 32-4-2.5 MEQ/L IV SOLN
INTRAVENOUS | Status: DC
  Administered 2013-08-22 – 2013-08-24 (×3): via INTRAVENOUS_CENTRAL
  Filled 2013-08-22 (×4): qty 5000

## 2013-08-22 MED ORDER — MORPHINE SULFATE 2 MG/ML IJ SOLN
INTRAMUSCULAR | Status: AC
  Administered 2013-08-22: 2 mg via INTRAVENOUS
  Filled 2013-08-22: qty 1

## 2013-08-22 MED ORDER — SODIUM POLYSTYRENE SULFONATE 15 GM/60ML PO SUSP
30.0000 g | Freq: Once | ORAL | Status: AC
  Administered 2013-08-22: 30 g via ORAL
  Filled 2013-08-22: qty 120

## 2013-08-22 MED ORDER — MAGNESIUM SULFATE 4000MG/100ML IJ SOLN
4.0000 g | Freq: Once | INTRAMUSCULAR | Status: AC
  Administered 2013-08-22: 4 g via INTRAVENOUS
  Filled 2013-08-22: qty 100

## 2013-08-22 MED ORDER — MORPHINE SULFATE 2 MG/ML IJ SOLN
2.0000 mg | INTRAMUSCULAR | Status: DC | PRN
Start: 2013-08-22 — End: 2013-08-22

## 2013-08-22 MED ORDER — SODIUM CHLORIDE 0.9 % IJ SOLN
250.0000 [IU]/h | INTRAMUSCULAR | Status: DC
  Filled 2013-08-22: qty 2

## 2013-08-22 MED ORDER — PRISMASOL BGK 4/2.5 32-4-2.5 MEQ/L IV SOLN
INTRAVENOUS | Status: DC
  Administered 2013-08-22 – 2013-08-24 (×12): via INTRAVENOUS_CENTRAL
  Filled 2013-08-22 (×18): qty 5000

## 2013-08-22 MED ORDER — VANCOMYCIN HCL IN DEXTROSE 1-5 GM/200ML-% IV SOLN
1000.0000 mg | INTRAVENOUS | Status: DC
  Administered 2013-08-23: 1000 mg via INTRAVENOUS
  Filled 2013-08-22 (×2): qty 200

## 2013-08-22 MED ORDER — HEPARIN BOLUS VIA INFUSION (CRRT)
1000.0000 [IU] | INTRAVENOUS | Status: DC | PRN
  Filled 2013-08-22: qty 1000

## 2013-08-22 MED ORDER — HEPARIN SODIUM (PORCINE) 1000 UNIT/ML DIALYSIS
1000.0000 [IU] | INTRAMUSCULAR | Status: DC | PRN
  Filled 2013-08-22: qty 6

## 2013-08-22 MED ORDER — DEXTROSE 5 % IV SOLN: 1.0000 g | INTRAVENOUS | Status: DC

## 2013-08-22 MED ORDER — STERILE WATER FOR INJECTION IV SOLN
INTRAVENOUS | Status: DC
  Administered 2013-08-22 – 2013-08-24 (×14): via INTRAVENOUS_CENTRAL
  Filled 2013-08-22 (×18): qty 150

## 2013-08-22 MED ORDER — CEFEPIME HCL 2 G IJ SOLR
2.0000 g | Freq: Two times a day (BID) | INTRAMUSCULAR | Status: DC
  Administered 2013-08-22 – 2013-08-23 (×3): 2 g via INTRAVENOUS
  Filled 2013-08-22 (×4): qty 2

## 2013-08-22 MED ORDER — ACETAMINOPHEN 325 MG PO TABS
650.0000 mg | ORAL_TABLET | Freq: Four times a day (QID) | ORAL | Status: DC | PRN
  Administered 2013-08-22: 650 mg via ORAL
  Filled 2013-08-22: qty 2

## 2013-08-22 MED ORDER — CHLORHEXIDINE GLUCONATE 0.12 % MT SOLN
15.0000 mL | Freq: Two times a day (BID) | OROMUCOSAL | Status: DC
  Administered 2013-08-22 – 2013-08-24 (×4): 15 mL via OROMUCOSAL
  Filled 2013-08-22 (×4): qty 15

## 2013-08-22 MED ORDER — PANTOPRAZOLE SODIUM 40 MG PO TBEC
40.0000 mg | DELAYED_RELEASE_TABLET | ORAL | Status: DC
  Administered 2013-08-23: 40 mg via ORAL
  Filled 2013-08-22: qty 1

## 2013-08-22 MED ORDER — MORPHINE SULFATE 2 MG/ML IJ SOLN
2.0000 mg | INTRAMUSCULAR | Status: DC | PRN
  Administered 2013-08-22 – 2013-08-24 (×10): 2 mg via INTRAVENOUS
  Filled 2013-08-22 (×6): qty 1
  Filled 2013-08-22: qty 2
  Filled 2013-08-22: qty 1

## 2013-08-22 MED ORDER — SODIUM BICARBONATE 8.4 % IV SOLN
INTRAVENOUS | Status: DC
  Administered 2013-08-22: 12:00:00 via INTRAVENOUS
  Filled 2013-08-22: qty 150

## 2013-08-22 MED ORDER — LEVOFLOXACIN IN D5W 250 MG/50ML IV SOLN
250.0000 mg | INTRAVENOUS | Status: DC
  Administered 2013-08-22 – 2013-08-23 (×2): 250 mg via INTRAVENOUS
  Filled 2013-08-22 (×3): qty 50

## 2013-08-22 MED ORDER — BIOTENE DRY MOUTH MT LIQD
15.0000 mL | Freq: Two times a day (BID) | OROMUCOSAL | Status: DC
  Administered 2013-08-23 (×2): 15 mL via OROMUCOSAL

## 2013-08-22 MED ORDER — VANCOMYCIN HCL 10 G IV SOLR: 1500.0000 mg | INTRAVENOUS | Status: DC

## 2013-08-22 MED ORDER — MILRINONE IN DEXTROSE 20 MG/100ML IV SOLN
0.1250 ug/kg/min | INTRAVENOUS | Status: DC
  Administered 2013-08-22 – 2013-08-23 (×2): 0.125 ug/kg/min via INTRAVENOUS
  Filled 2013-08-22 (×4): qty 100

## 2013-08-22 MED ORDER — LEVOFLOXACIN IN D5W 500 MG/100ML IV SOLN: 500.0000 mg | INTRAVENOUS | Status: DC

## 2013-08-22 NOTE — Progress Notes (Signed)
Pharmacist Heart Failure Core Measure Documentation  Assessment: [name] [name] has an EF documented as 35-45%  on 06/10/2013 by Dr. Tresa Endo.  Rationale: Heart failure patients with left ventricular systolic dysfunction (LVSD) and an EF < 40% should be prescribed an angiotensin converting enzyme inhibitor (ACEI) or angiotensin receptor blocker (ARB) at discharge unless a contraindication is documented in the medical record.  This patient is not currently on an ACEI or ARB for HF.  This note is being placed in the record in order to provide documentation that a contraindication to the use of these agents is present for this encounter.  ACE Inhibitor or Angiotensin Receptor Blocker is contraindicated (specify all that apply)  []   ACEI allergy AND ARB allergy []   Angioedema []   Moderate or severe aortic stenosis []   Hyperkalemia []   Hypotension []   Renal artery stenosis [x]   Worsening renal function, preexisting renal disease or dysfunction   [name] [name] 08/22/2013 3:10 PM

## 2013-08-22 NOTE — Progress Notes (Addendum)
Name: [name] [name] MRN: [id number] DOB: [birth year]    ADMISSION DATE:  08/23/2013 CONSULTATION DATE: 08/18/2013   REFERRING MD : Dr. Lynelle Doctor   PRIMARY SERVICE: PCCM   CHIEF COMPLAINT: SOB and hypotension.   BRIEF PATIENT DESCRIPTION: 58 year old male with extensive PMH and previous diagnosis of amiodarone induced pulmonary toxicity. Patient was in normal state of health until day PTP where he started noticing increase WOB and feeling dizzy and SOB. Patient came to the ED where he was found to have a SBP in the 60's. Lactic acid was elevated. Patient initially responded to IVF but then BP dropped again. Patient is chronically on steroids.   SIGNIFICANT EVENTS / STUDIES:  2/18 Septic shock from PNA.  2/18 Dobutamine  Infusion 2/18 C. Diff negative  LINES / TUBES:  L IJ TLC 2/18>>>   CULTURES:  Blood 2/18>>> pending  Urine 2/18>>> pending  Sputum 2/18>>> needs to be collected  ANTIBIOTICS:  Cefepime 2/18>>>  Vanc 2/18>>>  Levaquin 2/18>>>   SUBJECTIVE: Sleeping on CPAP, remain son dobutmaine, levophed is off  VITAL SIGNS: Temp:  [96.5 F (35.8 C)-99.7 F (37.6 C)] 98 F (36.7 C) (02/19 0336) Pulse Rate:  [32-144] 123 (02/19 0600) Resp:  [16-33] 24 (02/19 0600) BP: (63-148)/(33-102) 124/75 mmHg (02/19 0600) SpO2:  [91 %-100 %] 99 % (02/19 0600) Weight:  [242 lb (109.77 kg)] 242 lb (109.77 kg) (02/18 1200) HEMODYNAMICS: CVP:  [16 mmHg-22 mmHg] 20 mmHg VENTILATOR SETTINGS:   INTAKE / OUTPUT: Intake/Output     02/18 0701 - 02/19 0700   I.V. (mL/kg) 2567.7 (23.4)   IV Piggyback 700   Total Intake(mL/kg) 3267.7 (29.8)   Urine (mL/kg/hr) 68   Total Output 68   Net +3199.7       Stool Occurrence 2 x     PHYSICAL EXAMINATION: General: Chronically ill appearing obese male.  Neuro: Alert and interactive, moving all ext to command.  HEENT: [place]/AT, PERRL, EOM-I and DMM.  Cardiovascular: IRIR, Nl S1/S2, -M/R/G.  Lungs: Decreased at the bases, L>R crackles.    Abdomen: Soft, NT, ND and +BS.  Musculoskeletal: Intact.  Skin: Intact.   LABS:  CBC  Recent Labs Lab 08/23/2013 0750  WBC 13.8*  HGB 10.0*  HCT 31.0*  PLT 295   Coag's  Recent Labs Lab 08/30/2013 1057 08/05/2013 2000  APTT 41* 66*   BMET  Recent Labs Lab 08/26/2013 0750  NA 135*  K 3.8  CL 99  CO2 14*  BUN 66*  CREATININE 4.49*  GLUCOSE 111*   Electrolytes  Recent Labs Lab 08/30/2013 0750  CALCIUM 9.0   Sepsis Markers  Recent Labs Lab 08/30/2013 1005 08/10/2013 1630 08/14/2013 2000  LATICACIDVEN 6.6* 5.0* 4.9*   ABG  Recent Labs Lab 08/23/2013 0813  PHART 7.444  PCO2ART 22.2*  PO2ART 102.0*   Liver Enzymes No results found for this basename: AST, ALT, ALKPHOS, BILITOT, ALBUMIN,  in the last 168 hours Cardiac Enzymes  Recent Labs Lab 08/15/2013 0750 08/17/2013 1005  TROPONINI  --  <0.30  PROBNP 11418.0*  --    Glucose  Recent Labs Lab 08/23/2013 1226 08/20/2013 1611 08/20/2013 1848 08/22/13 0018 08/22/13 0335  GLUCAP 95 91 88 111* 121*    Imaging Dg Chest Portable 1 View  08/13/2013   CLINICAL DATA:  Sepsis.  Central line placement.  EXAM: PORTABLE CHEST - 1 VIEW  COMPARISON:  08/17/2013 at 0758 hr  FINDINGS: There has been interval placement of a left jugular central venous  catheter with tip overlying the upper SVC with tip directed laterally. Marked enlargement of the cardiac silhouette remains. 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 Evaluation of the right lung is limited by technique. There is no definite evidence of airspace consolidation or overt edema. No large pleural effusion or pneumothorax is identified.  IMPRESSION: Interval left jugular CVC placement as above.  Cardiomegaly.   Electronically Signed   By: Sebastian AcheAllen  Grady   On: 08/13/2013 10:49   Dg Chest Port 1 View  08/17/2013   CLINICAL DATA:  Shortness of breath, history CHF, hypertension, diabetes, hyperlipidemia, former smoker  EXAM: PORTABLE CHEST - 1 VIEW  COMPARISON:  Portable exam 0758  hr compared to 07/09/2013  FINDINGS: Enlargement of cardiac silhouette post CABG.  Minimal atherosclerotic calcification aortic arch.  Pulmonary vascularity normal.  Poor definition of the left heart border cannot exclude mild lingular infiltrate.  Remaining lungs clear.  No gross pleural effusion or pneumothorax.  IMPRESSION: Enlargement of cardiac silhouette post CABG.  Indistinct left heart border, cannot exclude mild lingular infiltrate.   Electronically Signed   By: Ulyses SouthwardMark  Boles M.D.   On: 08/13/2013 08:07    CXR: pending   ASSESSMENT / PLAN:  PULMONARY  A: Hypoxic respiratory failure with LLL infiltrate (PNA) and OSA.      CPAP     Heart failulre component now P:  - Supplemental O2 as needed. On 3L at home, was needing 6LNC last night  - Pulmonary hygienes.  - CPAP when asleep (on home CPAP).  - DNI per patient's request -pcxr repeat now for edema -likely to need lasix  CARDIOVASCULAR  A: Septic shock, a-fib on xeralto,  Systolic heart failure P:  - Sepsis protocol.  - Heparin drip - Dobutamine drip started 2/18; Co ox 41.1 >>67.0 after gtt started, will keep infusion for now given edema on pcxr and still limited urine output and failure resp wise, ARF - BNP >11K - See ID section -trop x 1, ecg inferior old MI q waves -consult Dr Tillie RungAlred, cardiologists  RENAL  A: Acute on chronic renal failure and hypokalemia., large AG acidosis ( uremia, lactic) P: - BMET pending  - Cr 4.49 (baseline 2.35)>> awaited trend - Replace electrolytes as needed.  - UOP diminished overnight, s/p 500 bolus NS and dobutamine Gtt started -renal US likely required -off pressors, consider   GASTROINTESTINAL  A: Abdominal pain, ?diarrhea.  P:  - C. Diff. Negative  - Diabetic diet.   HEMATOLOGIC  A: Leukocytosis. dvt prevention, chronic fib P:  - Monitor CBC. Stable -heparin drip  INFECTIOUS  A: LLL PNA, nosocomial? P:  - Vanc/cefepime/levaquin.  - Pan culture.  - Narrow as able -pcxr in  am  .  ENDOCRINE  A: DM. Chronic steroids then shock P:  - CBG and ISS as ordered.  - Stress dose steroids remain given adrenal status   NEUROLOGIC  A: Intermittent confusion when hypotensive. Resolved. P:  - Monitor.   TODAY'S SUMMARY: 58 year old male with extensive PMH presenting with LLL PNA and septic shock. R/O C.diff. Spoke with patient and partner. Wish for no CPR/cardioversion/intubation. CPAP while in bed. Now on dobutamine, call cards, lasix   08/22/2013, 6:18 AM   Ccm time 30 min   I have fully examined this patient and agree with above findings.    And edited in full  Mcarthur RossettiDaniel J. Tyson AliasFeinstein, MD, FACP Pgr: [phone] [facility]   NOW more tachy rvr, may need to dc dobutmaine,  consider milrinone, NO AMIO!!  Mcarthur Rossetti. Tyson Alias, MD, FACP Pgr: [phone] [facility]

## 2013-08-22 NOTE — Progress Notes (Signed)
Reported to Pharmacist that a hematoma was noted in the pt's left upper arm around the peripheral IV where the heparin was infusing. Pt denied pain. Instructed to turn off the heparin for 1 hr then restart in different location

## 2013-08-22 NOTE — Progress Notes (Addendum)
ANTIBIOTIC CONSULT NOTE - FOLLOW UP  Pharmacy Consult for cefepime, levofloxacin, vancomycin Indication: pneumonia/sepsis  Allergies  Allergen Reactions  . Amiodarone Shortness Of Breath    Pulmonary toxicity    Patient Measurements: Height: 5\' 8"  (172.7 cm) Weight: 242 lb (109.77 kg) IBW/kg (Calculated) : 68.4   Vital Signs: Temp: 97.5 F (36.4 C) (02/19 0915) Temp src: Oral (02/19 0915) BP: 119/58 mmHg (02/19 0800) Pulse Rate: 116 (02/19 0800) Intake/Output from previous day: 02/18 0701 - 02/19 0700 In: 3267.7 [I.V.:2567.7; IV Piggyback:700] Out: 68 [Urine:68] Intake/Output from this shift: Total I/O In: 192.6 [I.V.:192.6] Out: 0   Labs:  Recent Labs  08/22/2013 0750 08/22/13 0635  WBC 13.8* 19.8*  HGB 10.0* 7.6*  PLT 295 120*  CREATININE 4.49* 6.07*   Estimated Creatinine Clearance: 15.9 ml/min (by C-G formula based on Cr of 6.07). No results found for this basename: VANCOTROUGH, Leodis Binet, VANCORANDOM, GENTTROUGH, GENTPEAK, GENTRANDOM, TOBRATROUGH, TOBRAPEAK, TOBRARND, AMIKACINPEAK, AMIKACINTROU, AMIKACIN,  in the last 72 hours   Microbiology: Recent Results (from the past 720 hour(s))  CULTURE, BLOOD (ROUTINE X 2)     Status: None   Collection Time    08/15/2013  8:41 AM      Result Value Ref Range Status   Specimen Description BLOOD LEFT ARM   Final   Special Requests     Final   Value: BOTTLES DRAWN AEROBIC AND ANAEROBIC BLUE 10CC RED 5CC   Culture  Setup Time     Final   Value: 08/04/2013 13:33     Performed at [facility]   Culture     Final   Value: GRAM POSITIVE COCCI IN CLUSTERS     Note: Gram Stain Report Called to,Read Back By and Verified With: TYESHA H @0922  08/22/13 BY KRAWS     Performed at [facility]   Report Status PENDING   Incomplete  CULTURE, BLOOD (ROUTINE X 2)     Status: None   Collection Time    08/07/2013  8:50 AM      Result Value Ref Range Status   Specimen Description BLOOD LEFT ANTECUBITAL   Final   Special Requests BOTTLES DRAWN AEROBIC ONLY 10CC   Final   Culture  Setup Time     Final   Value: 08/08/2013 13:33     Performed at [facility]   Culture     Final   Value:        BLOOD CULTURE RECEIVED NO GROWTH TO DATE CULTURE WILL BE HELD FOR 5 DAYS BEFORE ISSUING A FINAL NEGATIVE REPORT     Performed at [facility]   Report Status PENDING   Incomplete  MRSA PCR SCREENING     Status: None   Collection Time    08/27/2013 12:32 PM      Result Value Ref Range Status   MRSA by PCR NEGATIVE  NEGATIVE Final   Comment:            The GeneXpert MRSA Assay (FDA     approved for NASAL specimens     only), is one component of a     comprehensive MRSA colonization     surveillance program. It is not     intended to diagnose MRSA     infection nor to guide or     monitor treatment for     MRSA infections.  CLOSTRIDIUM DIFFICILE BY PCR     Status: None   Collection Time    08/22/2013  2:50 PM      Result Value Ref Range Status   C difficile by pcr NEGATIVE  NEGATIVE Final    Anti-infectives   Start     Dose/Rate Route Frequency Ordered Stop   08/22/13 1030  ceFEPIme (MAXIPIME) 2 g in dextrose 5 % 50 mL IVPB     2 g 100 mL/hr over 30 Minutes Intravenous Every 24 hours 08/16/2013 1013     08/22/13 1000  vancomycin (VANCOCIN) 1,500 mg in sodium chloride 0.9 % 500 mL IVPB     1,500 mg 250 mL/hr over 120 Minutes Intravenous Every 24 hours 08/29/2013 0848     08/19/2013 1700  piperacillin-tazobactam (ZOSYN) IVPB 3.375 g  Status:  Discontinued     3.375 g 12.5 mL/hr over 240 Minutes Intravenous Every 8 hours 08/18/2013 0848 08/14/2013 1006   08/23/2013 1100  levofloxacin (LEVAQUIN) IVPB 750 mg     750 mg 100 mL/hr over 90 Minutes Intravenous Every 48 hours 08/19/2013 1050     08/22/2013 1015  ceFEPIme (MAXIPIME) 2 g in dextrose 5 % 50 mL IVPB     2 g 100 mL/hr over 30 Minutes Intravenous  Once 08/23/2013 1006 08/28/2013 1124   08/23/2013 1015  vancomycin (VANCOCIN) IVPB 1000 mg/200 mL premix   Status:  Discontinued     1,000 mg 200 mL/hr over 60 Minutes Intravenous  Once 08/05/2013 1006 08/19/2013 1058   08/17/2013 0845  vancomycin (VANCOCIN) 1,750 mg in sodium chloride 0.9 % 500 mL IVPB     1,750 mg 250 mL/hr over 120 Minutes Intravenous  Once 08/08/2013 0838 08/22/2013 1112   08/06/2013 0830  piperacillin-tazobactam (ZOSYN) IVPB 3.375 g     3.375 g 100 mL/hr over 30 Minutes Intravenous  Once 08/17/2013 0826 08/09/2013 0932   08/08/2013 0830  vancomycin (VANCOCIN) IVPB 1000 mg/200 mL premix  Status:  Discontinued     1,000 mg 200 mL/hr over 60 Minutes Intravenous  Once 08/26/2013 0826 08/30/2013 0837      Assessment: 57 YOM with CKD with solitary kidney on antibiotics for PNA/sepsis. SCr worsened overnight to 6.07 and CrCl now ~2816mL/min. WBC increased to 19.8, afebrile. Only culture results so far is 1/2 gm pos cocci in blood cultures.  Goal of Therapy:  Vancomycin trough level 15-20 mcg/ml  Plan:  1. Change levofloxacin to 500mg  IV q48h starting 2/20 2. Change cefepime to 1g IV q24h starting 2/20 3. Chance vancomycin to 1500mg  IV q48h starting 2/21 4. Follow renal function, c/s, [facility]  Adaysha Dubinsky D. Candus Braud, PharmD, BCPS Clinical Pharmacist Pager: [phone] 08/22/2013 9:54 AM  ADDENDUM Patient now to start CRRT and antibiotic doses need to be adjusted with that therapy. Likely will not start therapy for a few hours.  Plan: 1. Cefepime 2g IV q12h starting at 2200 2. Levofloxacin 250mg  IV q24h starting at 1600 3. Vancomycin 1000mg  IV q24h (~10mg /kg q24h) starting tomorrow morning at 1000  Catrina Fellenz D. Reyann Troop, PharmD, BCPS Clinical Pharmacist Pager: [phone] 08/22/2013 1:31 PM

## 2013-08-22 NOTE — Progress Notes (Signed)
HR 150's Sats 70's accessory muscles used to breath while on CPAP. MD checked the pt via camera. BiPAP ordered and morphine.

## 2013-08-22 NOTE — Progress Notes (Signed)
CRITICAL VALUE ALERT  Critical value received:  Troponin 0.41 gram + cocci in clusters  Date of notification:  08/22/13  Time of notification:  0845  Critical value read back:yes  Nurse who received alert:  Janice Coffin  MD notified (1st page):  Dr. Tyson Alias  Time of first page:  0845  MD notified (2nd page):  Time of second page:  Responding MD:  Dr. Tyson Alias  Time MD responded:  [phone]

## 2013-08-22 NOTE — Progress Notes (Signed)
Called to evaluate patient.  A-fib with RVR, needing HD catheter placement for HD. Cardizem drip started.  Heparin held for catheter placement.  Returned two hours later for catheter placement.   Catheter placed successfully and patient's BP holding for now with cardizem.  If becomes hypotensive will start neo.  Additional CC time 45 minutes.  Alyson Reedy, M.D. [facility] Pulmonary/Critical Care Medicine. Pager: [phone]. After hours pager: [phone].

## 2013-08-22 NOTE — Procedures (Signed)
Hemodialysis Catheter Insertion Procedure Note [name] [name] [id number] 05-18-1956  Procedure: Insertion of Central Venous Catheter Indications: hemodialysis   Procedure Details Consent: Risks of procedure as well as the alternatives and risks of each were explained to the (patient/caregiver).  Consent for procedure obtained. Time Out: Verified patient identification, verified procedure, site/side was marked, verified correct patient position, special equipment/implants available, medications/allergies/relevent history reviewed, required imaging and test results available.  Performed  Maximum sterile technique was used including antiseptics, cap, gloves, gown, hand hygiene, mask and sheet. Skin prep: Chlorhexidine; local anesthetic administered A antimicrobial bonded/coated triple lumen HD catheter was placed in the right femoral vein due to multiple attempts, no other available access using the Seldinger technique.  Evaluation Blood flow good Complications: No apparent complications Patient did tolerate procedure well. Chest X-ray ordered to verify placement.  CXR: n/a.  Placed under direct MD supervision using ultrasound guidance.   Placed by Rutherford Guys PA-C  U/S used in placement.  Alyson Reedy, M.D. [facility] Pulmonary/Critical Care Medicine. Pager: [phone]. After hours pager: [phone].

## 2013-08-22 NOTE — Progress Notes (Signed)
MD notified that HR continues to range 120's-140's. MD consulted with multiple doctors and there are no more interventions for the pt. Cards also came by and verified that there are no more interventions for the HR. Will continue to monitor.

## 2013-08-22 NOTE — Progress Notes (Signed)
eLink Physician-Brief Progress Note Patient Name: [name] [name] DOB: [birth year] MRN: [id number]  Date of Service  08/22/2013   HPI/Events of Note   Worsening respiratory failure, hypoxemia Shock improved from last night  eICU Interventions  Morphine now bipap now NP placing HD cath now   Intervention Category Major Interventions: Respiratory failure - evaluation and management Intermediate Interventions: Arrhythmia - evaluation and management  Chaney Ingram 08/22/2013, 4:22 PM

## 2013-08-22 NOTE — Consult Note (Signed)
Reason for Consult:AKI/CKD, hyperkalemia, met acidosis Referring Physician: Titus Mould, MD  [name] [name] is an 58 y.o. male.  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 He was found to have a LLL infiltrate c/w HCAP and developed SIRS and was started on pressors.   His hospitalization was c/b the development of hyperkalemia, profound metabolic acidosis (Of note he has been off of metformin for the last 2 months) but admits to taking alleve over the last week due to a tooth ache. We were asked to help further evaluate and manage his AKI/CKD and metabolic derangements.  He has been clear in the past that he would not consider long-term HD but would agree to short term.  He also would not want to be intubated given his pulm issues Trend in Creatinine:  Creatinine, Ser  Date/Time Value Ref Range Status  08/22/2013  6:35 AM 6.07* 0.50 - 1.35 mg/dL Final  08/28/2013  7:50 AM 4.49* 0.50 - 1.35 mg/dL Final  08/06/2013  2:47 PM 2.36* 0.50 - 1.35 mg/dL Final  07/12/2013 12:23 PM 2.36* 0.50 - 1.35 mg/dL Final  06/17/2013 12:30 PM 2.49* 0.50 - 1.35 mg/dL Final  06/13/2013  5:16 AM 2.38* 0.50 - 1.35 mg/dL Final  06/12/2013  5:12 AM 2.51* 0.50 - 1.35 mg/dL Final  06/11/2013 12:17 AM 2.34* 0.50 - 1.35 mg/dL Final  06/10/2013  1:00 PM 2.54* 0.50 - 1.35 mg/dL Final  05/08/2013  7:39 AM 2.16* 0.50 - 1.35 mg/dL Final  04/29/2013 12:59 PM 1.77* 0.50 - 1.35 mg/dL Final  04/27/2013  5:22 AM 2.18* 0.50 - 1.35 mg/dL Final  04/26/2013  5:00 AM 2.35* 0.50 - 1.35 mg/dL Final  04/25/2013  5:50 AM 2.55* 0.50 - 1.35 mg/dL Final  04/24/2013  9:47 AM 2.06* 0.50 - 1.35 mg/dL Final  04/22/2013  8:35 AM 2.32* 0.50 - 1.35 mg/dL Final  04/21/2013  5:35 AM  2.95* 0.50 - 1.35 mg/dL Final  04/20/2013  8:47 AM 3.03* 0.50 - 1.35 mg/dL Final  04/19/2013  4:05 AM 2.83* 0.50 - 1.35 mg/dL Final  04/18/2013 12:58 PM 2.60* 0.50 - 1.35 mg/dL Final  10/17/2012 11:26 AM 2.26* 0.50 - 1.35 mg/dL Final  11/19/2010  5:06 AM 1.67* 0.4 - 1.5 mg/dL Final  11/18/2010  8:56 AM 1.86* 0.4 - 1.5 mg/dL Final    PMH:   Past Medical History  Diagnosis Date  . Hypertension   . Obesity     with a BMI of 35  . Gout   . GERD (gastroesophageal reflux disease)   . Depression   . ASHD (arteriosclerotic heart disease)     hx CABG 2005, patent grafts 04/2013  . Diabetes mellitus   . Hyperlipidemia   . Hypertriglyceridemia     TRIGS OVER 3000 IN THE PAST; MOST RECENT TRIGS OF 306, HDL 36, VLDL 61, LDL 89  . 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 History of tobacco abuse     PT QUIT 07/04/12  . OSA on CPAP   . Shortness of breath   . Nonischemic cardiomyopathy, ef 30-40% by echo 03/2013 down from 49%, was in atrial fib  at time of Echo 05/01/2013  . CKD (chronic kidney disease) stage 3, GFR 30-59  ml/min, recently was CKD stage 4 with diuressing. 05/01/2013  . Carotid disease, bilateral   . Persistent atrial fibrillation 04/18/2013    DCCV 11/2012; 04/2013  . Chronic anticoagulation 04/18/2013    xarelto  . Amiodarone pulmonary toxicity   . Hypotension 08/19/2013    PSH:   Past Surgical History  Procedure Laterality Date  . Appendectomy  1983  . Cardioversion N/A 11/14/2012    Procedure: CARDIOVERSION;  Surgeon: Pixie Casino, MD;  Location[facility] ENDOSCOPY;  Service: Cardiovascular;  Laterality: N/A;  . Coronary artery bypass graft  05/11/04    X 4; HAS A VERY SHORT AORTA, ROUND DILATED HEART AS WELL AS A STERNAL ABNORMALITY   . Cardiac catheterization  11/18/10    GRAFTS WIDELY PATENT WITH NO HIGH-GRADE CAD  . Carotids  04/03/12    CAROTID DUPLEX; RIGHT BULB/PROXIMAL ICA: MILD AMT OF FIBROUS PLAQUE SLIGHTLY ELEVATING VELOCITIES WITHIN THE  PROXIMAL SEGMENT OF THE INTERNAL CAROTID  ARTERY  . Nm myoview ltd  09/17/09    EF 49%; GLOBAL LV SYSTOLIC FUNCTION MILDLY REDUCED  . Cardiac catheterization  04/2013    patent grafts, also RHC done    Allergies:  Allergies  Allergen Reactions  . Amiodarone Shortness Of Breath    Pulmonary toxicity    Medications:   Prior to Admission medications   Medication Sig Start Date End Date Taking? Authorizing Provider  acetaminophen (TYLENOL) 500 MG tablet Take 1,000 mg by mouth every 6 (six) hours as needed for pain.   Yes Historical Provider, MD  allopurinol (ZYLOPRIM) 300 MG tablet take 1 tablet by mouth once daily 09/22/12  Yes Denita Lung, MD  amLODipine (NORVASC) 5 MG tablet Take 5 mg by mouth daily.   Yes Historical Provider, MD  aspirin EC 81 MG tablet Take 81 mg by mouth at bedtime.   Yes Historical Provider, MD  atorvastatin (LIPITOR) 20 MG tablet Take 1 tablet (20 mg total) by mouth at bedtime. 06/13/13  Yes Cecilie Kicks, NP  busPIRone (BUSPAR) 15 MG tablet take 1 tablet by mouth twice a day AT 10AM AND 5PM 07/01/13  Yes Denita Lung, MD  cholecalciferol (VITAMIN D) 1000 UNITS tablet Take 1,000 Units by mouth daily.   Yes Historical Provider, MD  Choline Fenofibrate (TRILIPIX) 135 MG capsule Take 135 mg by mouth daily.   Yes Historical Provider, MD  citalopram (CELEXA) 20 MG tablet Take 20 mg by mouth daily.   Yes Historical Provider, MD  FORTESTA 10 MG/ACT (2%) GEL apply 4 PUMPS once daily to SKIN 03/16/13  Yes Denita Lung, MD  furosemide (LASIX) 40 MG tablet Take 40-60 mg by mouth daily. Take 58m AM and take 41mPM 06/13/13  Yes LaCecilie KicksNP  glucose blood test strip Use as instructed(THIS IS FOR THE ONETOUCH VERIO IQ ) 03/13/13  Yes JoDenita LungMD  hydrALAZINE (APRESOLINE) 50 MG tablet Take 25 mg by mouth 3 (three) times daily. Take 1/2 tab tid 05/25/13  Yes Historical Provider, MD  Insulin Glargine (LANTUS SOLOSTAR) 100 UNIT/ML Solostar Pen Inject 10 Units into the  skin daily at 10 pm. 07/23/13  Yes JoDenita LungMD  Insulin Pen Needle (BD ULTRA-FINE PEN NEEDLES) 29G X 12.7MM MISC Use daily 07/24/13  Yes JoDenita LungMD  Lansoprazole (PREVACID PO) Take 1 capsule by mouth at bedtime.   Yes Historical Provider, MD  levalbuterol (XOPENEX HFA) 45 MCG/ACT inhaler Inhale 1-2 puffs into the lungs every 4 (four) hours as needed  for wheezing. 10/15/12  Yes Denita Lung, MD  LOVAZA 1 G capsule take 4 capsules by mouth daily 07/01/13  Yes Denita Lung, MD  metoprolol tartrate (LOPRESSOR) 50 MG tablet Take 1 tablet (50 mg total) by mouth 2 (two) times daily. 07/24/13  Yes Thompson Grayer, MD  mometasone (NASONEX) 50 MCG/ACT nasal spray Place 2 sprays into the nose daily. 07/17/13  Yes Pixie Casino, MD  niacin 500 MG CR capsule Take 1 capsule (500 mg total) by mouth at bedtime. 06/13/13  Yes Cecilie Kicks, NP  OXYGEN-HELIUM IN Inhale 3 L into the lungs continuous.   Yes Historical Provider, MD  potassium chloride SA (K-DUR,KLOR-CON) 20 MEQ tablet Take 2 tablets (40 mEq total) by mouth daily. 06/13/13  Yes Cecilie Kicks, NP  predniSONE (DELTASONE) 20 MG tablet Take 1 tablet (20 mg total) by mouth daily with breakfast. 08/02/13  Yes Brand Males, MD  Rivaroxaban (XARELTO) 15 MG TABS tablet Take 15 mg by mouth daily.   Yes Historical Provider, MD    Inpatient medications: . busPIRone  15 mg Oral BID  . [START ON 08/23/2013] ceFEPime (MAXIPIME) IV  1 g Intravenous Q24H  . citalopram  20 mg Oral Daily  . furosemide  80 mg Intravenous Q12H  . hydrocortisone sodium succinate  50 mg Intravenous Q6H  . insulin aspart  2-6 Units Subcutaneous 6 times per day  . [START ON 08/23/2013] levofloxacin (LEVAQUIN) IV  500 mg Intravenous Q48H  . magnesium sulfate 1 - 4 g bolus IVPB  4 g Intravenous Once  . [START ON 08/23/2013] pantoprazole  40 mg Oral Q24H  . sodium polystyrene  30 g Oral Once  . [START ON Sep 06, 2013] vancomycin  1,500 mg Intravenous Q48H    Discontinued Meds:    Medications Discontinued During This Encounter  Medication Reason  . vancomycin (VANCOCIN) IVPB 1000 mg/200 mL premix Dose change  . piperacillin-tazobactam (ZOSYN) IVPB 3.375 g   . vancomycin (VANCOCIN) IVPB 1000 mg/200 mL premix Dose change  . heparin injection 5,000 Units   . sodium chloride 0.9 % bolus 1,000 mL   . sodium chloride 0.9 % bolus 1,000 mL   . 0.9 %  sodium chloride infusion   . ceFEPIme (MAXIPIME) 2 g in dextrose 5 % 50 mL IVPB   . levofloxacin (LEVAQUIN) IVPB 750 mg   . vancomycin (VANCOCIN) 1,500 mg in sodium chloride 0.9 % 500 mL IVPB   . pantoprazole (PROTONIX) injection 40 mg Patient Preference    Social History:  reports that he quit smoking about 13 months ago. His smoking use included Cigarettes. He has a 40 pack-year smoking history. He has never used smokeless tobacco. He reports that he does not drink alcohol or use illicit drugs.  Family History:   Family History  Problem Relation Age of Onset  . Heart disease Father   . Cancer Father 70    LUNG  . Hypertension Father   . Diabetes Brother   . Stroke Maternal Grandmother 60  . Heart disease Paternal Grandmother     A comprehensive review of systems was negative except for: Constitutional: positive for fatigue and malaise Respiratory: positive for dyspnea on exertion and pneumonia Cardiovascular: positive for dyspnea, fatigue, lower extremity edema and palpitations Weight change:   Intake/Output Summary (Last 24 hours) at 08/22/13 1150 Last data filed at 08/22/13 1100  Gross per 24 hour  Intake 3561.01 ml  Output     68 ml  Net 3493.01 ml   BP  84/65  Pulse 108  Temp(Src) 97.5 F (36.4 C) (Oral)  Resp 21  Ht 5' 8"  (1.727 m)  Wt 109.77 kg (242 lb)  BMI 36.80 kg/m2  SpO2 100% Filed Vitals:   08/22/13 0900 08/22/13 0915 08/22/13 1000 08/22/13 1100  BP: 91/70  122/60 84/65  Pulse: 122  134 108  Temp:  97.5 F (36.4 C)    TempSrc:  Oral    Resp: 21  25 21   Height:      Weight:       SpO2: 99%  99% 100%     General appearance: alert, cooperative and sitting bolt upright in a chair Head: Normocephalic, without obvious abnormality, atraumatic Resp: rales LLL > RLL Cardio: irregularly irregular rhythm and tachycardic GI: distended,  +BS, tense, NT Extremities: edema trace pretib  Labs: Basic Metabolic Panel:  Recent Labs Lab 08/17/2013 0750 08/22/13 0635  NA 135* 138  K 3.8 5.5*  CL 99 101  CO2 14* 13*  GLUCOSE 111* 130*  BUN 66* 74*  CREATININE 4.49* 6.07*  ALBUMIN  --  2.5*  CALCIUM 9.0 7.1*  PHOS  --  8.6*   Liver Function Tests:  Recent Labs Lab 08/22/13 0635  AST 5776*  ALT 2100*  ALKPHOS 44  BILITOT 1.1  PROT 6.0  ALBUMIN 2.5*   No results found for this basename: LIPASE, AMYLASE,  in the last 168 hours No results found for this basename: AMMONIA,  in the last 168 hours CBC:  Recent Labs Lab 08/12/2013 0750 08/22/13 0635  WBC 13.8* 19.8*  NEUTROABS  --  18.2*  HGB 10.0* 7.6*  HCT 31.0* 24.2*  MCV 93.1 93.1  PLT 295 120*   PT/INR: @LABRCNTIP (inr:5) Cardiac Enzymes: ) Recent Labs Lab 08/07/2013 1005 08/22/13 0830  TROPONINI <0.30 0.41*   CBG:  Recent Labs Lab 08/13/2013 1611 08/15/2013 1848 08/22/13 0018 08/22/13 0335 08/22/13 0824  GLUCAP 91 88 111* 121* 148*    Iron Studies: No results found for this basename: IRON, TIBC, TRANSFERRIN, FERRITIN,  in the last 168 hours  Xrays/Other Studies: US Renal Port  08/22/2013   CLINICAL DATA:  Decreased urinary output  EXAM: RENAL/URINARY TRACT ULTRASOUND COMPLETE  COMPARISON:  US RENAL dated 04/18/2013  FINDINGS: Right Kidney:  The renal cortical echotexture is equal to or slightly increased when compared to that of the adjacent liver. There are multiple cysts visible with the largest measuring 3 cm in greatest dimension in the lower pole. There is no hydronephrosis.  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 No left kidney is demonstrated.   Bladder:  The urinary bladder is decompressed with a Foley catheter.  IMPRESSION: The echotexture of the solitary right kidney is mildly increased consistent with medical renal disease. There is no hydronephrosis. There are multiple cysts in the right kidney with the largest measuring 3 cm in greatest dimension.   Electronically Signed   By: Murriel  Martinique   On: 08/22/2013 10:24   Dg Chest Port 1 View  08/22/2013   CLINICAL DATA:  Shortness of breath  EXAM: PORTABLE CHEST - 1 VIEW  COMPARISON:  08/14/2013  FINDINGS: Cardiac shadow is a again enlarged. Postsurgical changes are seen. A central venous catheter is noted from the left jugular vein at the junction of the innominate veins. It is stable in appearance. No pneumothorax is noted. No focal infiltrate is seen.  IMPRESSION: No change from the prior exam.   Electronically Signed   By: Inez Catalina  M.D.   On: 08/22/2013 07:49   Dg Chest Portable 1 View  08/17/2013   CLINICAL DATA:  Sepsis.  Central line placement.  EXAM: PORTABLE CHEST - 1 VIEW  COMPARISON:  08/19/2013 at 0758 hr  FINDINGS: There has been interval placement of a left jugular central venous catheter with tip overlying the upper SVC with tip directed laterally. Marked enlargement of the cardiac silhouette remains. 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 Evaluation of the right lung is limited by technique. There is no definite evidence of airspace consolidation or overt edema. No large pleural effusion or pneumothorax is identified.  IMPRESSION: Interval left jugular CVC placement as above.  Cardiomegaly.   Electronically Signed   By: Logan Bores   On: 08/17/2013 10:49   Dg Chest Port 1 View  08/11/2013   CLINICAL DATA:  Shortness of breath, history CHF, hypertension, diabetes, hyperlipidemia, former smoker  EXAM: PORTABLE CHEST - 1 VIEW  COMPARISON:  Portable exam 0758 hr compared to 07/09/2013  FINDINGS: Enlargement of cardiac silhouette post CABG.  Minimal atherosclerotic calcification  aortic arch.  Pulmonary vascularity normal.  Poor definition of the left heart border cannot exclude mild lingular infiltrate.  Remaining lungs clear.  No gross pleural effusion or pneumothorax.  IMPRESSION: Enlargement of cardiac silhouette post CABG.  Indistinct left heart border, cannot exclude mild lingular infiltrate.   Electronically Signed   By: Lavonia Dana M.D.   On: 08/22/2013 08:07     Assessment/Plan: 1.  AKI/CKD in setting of SIRS on pressors- oliguric. Only 20cc of urine and now with evidence of volume excess on exam.  I had a lengthy discussion with Mr. Lockridge and his partner, and he again informs me that he would not be interested in longterm HD but would consider short term as he would not agree with intubation.  Given his hemodynamic situation, need for IV bicarb to treat his hyperkalemia and met acidosis, as well as his limited pulm reserve and risk for CHF, we will plan to proceed with short-term CVVHD to help correct his metabolic derangements and not contribute to his volume overload. 2. Hyperkalemia- due to #1 as above, plan for cvvhd. Stop KCl supplements 3. Metabolic acidosis- gap. Plan for cvvhd and stop IV bicarb peripherally to avoid volume xs 4. SIRS- cont with abx and pressors per PCCM 5. A Fib with RVR- consult cardiology 6. PNA- as above 7. Dispo- pt is aware of his multiple medical problems and has a limited code    [place] A 08/22/2013, 11:50 AM

## 2013-08-22 NOTE — Progress Notes (Signed)
Pharmacy Consult for Milrinone (Primacor) Initiation  Indication:   Acute Decompensated Heart Failure with volume overload and low cardiac output  Allergies  Allergen Reactions  . Amiodarone Shortness Of Breath    Pulmonary toxicity    Temp:  [97 F (36.1 C)-99.7 F (37.6 C)] 97.5 F (36.4 C) (02/19 0915) Pulse Rate:  [41-144] 108 (02/19 1100) Cardiac Rhythm:  [-] Atrial fibrillation (02/19 0800) Resp:  [17-33] 21 (02/19 1100) BP: (70-148)/(38-102) 84/65 mmHg (02/19 1100) SpO2:  [91 %-100 %] 100 % (02/19 1100) Weight:  [242 lb (109.77 kg)] 242 lb (109.77 kg) (02/18 1200)  LABS    Component Value Date/Time   NA 138 08/22/2013 0635   K 5.5* 08/22/2013 0635   CL 101 08/22/2013 0635   CO2 13* 08/22/2013 0635   GLUCOSE 130* 08/22/2013 0635   BUN 74* 08/22/2013 0635   CREATININE 6.07* 08/22/2013 0635   CREATININE 2.36* 08/06/2013 1447   CALCIUM 7.1* 08/22/2013 0635   GFRNONAA 9* 08/22/2013 0635   GFRAA 11* 08/22/2013 0635   Last magnesium:  Lab Results  Component Value Date   MG 1.1* 08/22/2013   Estimated Creatinine Clearance: 15.9 ml/min (by C-G formula based on Cr of 6.07). CREATININE: 6.07 mg/dL ABNORMAL (44/01/02 7253) Estimated creatinine clearance - Cockcroft-Gault CrCl: 15.9 mL/min estimated creatinine clearance is 15.9 ml/min (by C-G formula based on Cr of 6.07).   Intake/Output Summary (Last 24 hours) at 08/22/13 1130 Last data filed at 08/22/13 0800  Gross per 24 hour  Intake 3460.28 ml  Output     68 ml  Net 3392.28 ml    Filed Weights   2013-09-18 1200  Weight: 242 lb (109.77 kg)    Assessment:  58 y.o. male admitted September 18, 2013. Magnesium 1.1- replacing with 4g, K is 5.5, Phos is elevated at 8.6. SCr increased to 6.07, CrCl is ~109mL/min. Call physician for replacement if potassium is < 4 or magnesium is < 2 and replacement has not already been ordered.  Milrinone can cause arrhythmias.  Monitor patient for ECG changes.  Plan is to initiate milrinone for inotropic  support.  Plan:   1. Initiate milrinone based on renal function: (Consider starting dose of 0.125 - 0.25 for patients with SBP <174mmHg) [facility] Calculated CrCl Dose  []  > 50 ml/min 0.375 mcg/kg/min  []  20-49 ml/min 0.250 mcg/kg/min  [x]  < 20 ml/min 0.125 mcg/kg/min   2. Nursing to monitor vital signs per milrinone protocol and physician parameters. 3. Pharmacy to follow peripherally, please reconsult if needed or there is further questions. 4.  Please contact MD for further dosing instructions.  Thank you for allowing [place] to be a part of this patient's care.  Kamorah Nevils  11:30 AM 08/22/2013

## 2013-08-22 NOTE — Consult Note (Signed)
CARDIOLOGY CONSULT NOTE  Patient ID: [name] [name] MRN: [id number] DOB/AGE: July 24, 1955 58 y.o.  Admit date: 08-23-13 Referring Physician: Dr Tyson Alias Primary Cardiologist: Dr Rennis Golden Reason for Consultation: Atrial fibrillation with RVR, CHF  HPI: This is a 58 year old gentleman with a complex cardiac history who was hospitalized February 18 with worsening shortness of breath and respiratory failure.  His medical history includes coronary artery disease status post CABG in 2005. He also has morbid obesity, hyperlipidemia with history of triglycerides greater than 3000 in the past, obstructive sleep apnea, and atrial fibrillation. His last heart catheterization in October 2014 demonstrated patency of his bypass grafts. The patient has chronic kidney disease. He has undergone cardioversion for atrial fibrillation but failed this. In 2014 he developed progressive left ventricular dysfunction with an ejection fraction in the range of 30-40%. He was ultimately started on oral amiodarone but unfortunately developed amiodarone lung toxicity within a few weeks. He's had progressive respiratory problems since that time despite treatment with steroids. There has also been concern about congestive heart failure contributing to his dyspnea. His dyspnea has not improved with increased diuretics on an outpatient basis.  He was admitted yesterday with hypotension and worsening shortness of breath. He continues in atrial fibrillation with RVR. There is a question of a left lingular pneumonia and he is being treated with antibiotics. The patient has been hypotensive and has progressive renal failure. He has been seen in consultation by the renal team and there are plans for short-term hemodialysis. He does not want to do long-term hemodialysis, nor does he want intubation and mechanical ventilation if he develops further respiratory failure.  The patient denies chest pain or pressure, limitations, or leg  swelling. He complains of abdominal swelling, orthopnea, and shortness of breath as above. He has been started on milrinone.  Past Medical History  Diagnosis Date  . Hypertension   . Obesity     with a BMI of 35  . Gout   . GERD (gastroesophageal reflux disease)   . Depression   . ASHD (arteriosclerotic heart disease)     hx CABG 2005, patent grafts 04/2013  . Diabetes mellitus   . Hyperlipidemia   . Hypertriglyceridemia     TRIGS OVER 3000 IN THE PAST; MOST RECENT TRIGS OF 306, HDL 36, VLDL 61, LDL 89  . 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 History of tobacco abuse     PT QUIT 07/04/12  . OSA on CPAP   . Shortness of breath   . Nonischemic cardiomyopathy, ef 30-40% by echo 03/2013 down from 49%, was in atrial fib  at time of Echo 05/01/2013  . CKD (chronic kidney disease) stage 3, GFR 30-59 ml/min, recently was CKD stage 4 with diuressing. 05/01/2013  . Carotid disease, bilateral   . Persistent atrial fibrillation 04/18/2013    DCCV 11/2012; 04/2013  . Chronic anticoagulation 04/18/2013    xarelto  . Amiodarone pulmonary toxicity   . Hypotension 08/19/2013     Past Surgical History  Procedure Laterality Date  . Appendectomy  1983  . Cardioversion N/A 11/14/2012    Procedure: CARDIOVERSION;  Surgeon: [name], MD;  Location: [facility] ENDOSCOPY;  Service: Cardiovascular;  Laterality: N/A;  . Coronary artery bypass graft  05/11/04    X 4; HAS A VERY SHORT AORTA, ROUND DILATED HEART AS WELL AS A STERNAL ABNORMALITY   . Cardiac catheterization  11/18/10    GRAFTS WIDELY PATENT WITH  NO HIGH-GRADE CAD  . Carotids  04/03/12    CAROTID DUPLEX; RIGHT BULB/PROXIMAL ICA: MILD AMT OF FIBROUS PLAQUE SLIGHTLY ELEVATING VELOCITIES WITHIN THE PROXIMAL SEGMENT OF THE INTERNAL CAROTID  ARTERY  . Nm myoview ltd  09/17/09    EF 49%; GLOBAL LV SYSTOLIC FUNCTION MILDLY REDUCED  . Cardiac catheterization  04/2013    patent grafts, also RHC done     Family History  Problem  Relation Age of Onset  . Heart disease Father   . Cancer Father 1869    LUNG  . Hypertension Father   . Diabetes Brother   . Stroke Maternal Grandmother 60  . Heart disease Paternal Grandmother     Social History: History   Social History  . Marital Status: Single    Spouse Name: N/A    Number of Children: N/A  . Years of Education: N/A   Occupational History  . Psychologist, occupationalcomputer installer (for labs)     [employer] FOR HIS [place]OB   Social History Main Topics  . Smoking status: Former Smoker -- 1.00 packs/day for 40 years    Types: Cigarettes    Quit date: 07/04/2012  . Smokeless tobacco: Never Used  . 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 Drug Use: No  . Sexual Activity: Yes   Other Topics Concern  . Not on file   Social History Narrative   3 YRS COLLEGE      DOES NOT EXERCISE      TRAVELS FREQUENTLY AROUND THE [place]OB[place] APPROX 3 WEEKS OUT OF EACH MONTH; USUALLY LESS THAN 2 HOURS ON A PLANE           Prior to Admission medications   Medication Sig Start Date End Date Taking? Authorizing Provider  acetaminophen (TYLENOL) 500 MG tablet Take 1,000 mg by mouth every 6 (six) hours as needed for pain.   Yes Historical Provider, MD  allopurinol (ZYLOPRIM) 300 MG tablet take 1 tablet by mouth once daily 09/22/12  Yes Ronnald NianJohn C Lalonde, MD  amLODipine (NORVASC) 5 MG tablet Take 5 mg by mouth daily.   Yes Historical Provider, MD  aspirin EC 81 MG tablet Take 81 mg by mouth at bedtime.   Yes Historical Provider, MD  atorvastatin (LIPITOR) 20 MG tablet Take 1 tablet (20 mg total) by mouth at bedtime. 06/13/13  Yes Nada BoozerLaura Ingold, NP  busPIRone (BUSPAR) 15 MG tablet take 1 tablet by mouth twice a day AT 10AM AND 5PM 07/01/13  Yes Ronnald NianJohn C Lalonde, MD  cholecalciferol (VITAMIN D) 1000 UNITS tablet Take 1,000 Units by mouth daily.   Yes Historical Provider, MD  Choline Fenofibrate (TRILIPIX) 135 MG capsule Take 135 mg by mouth daily.   Yes Historical  Provider, MD  citalopram (CELEXA) 20 MG tablet Take 20 mg by mouth daily.   Yes Historical Provider, MD  FORTESTA 10 MG/ACT (2%) GEL apply 4 PUMPS once daily to SKIN 03/16/13  Yes Ronnald NianJohn C Lalonde, MD  furosemide (LASIX) 40 MG tablet Take 40-60 mg by mouth daily. Take 60mg  AM and take 40mg  PM 06/13/13  Yes Nada BoozerLaura Ingold, NP  glucose blood test strip Use as instructed(THIS IS FOR THE ONETOUCH VERIO IQ ) 03/13/13  Yes Ronnald NianJohn C Lalonde, MD  hydrALAZINE (APRESOLINE) 50 MG tablet Take 25 mg by mouth 3 (three) times daily. Take 1/2 tab tid 05/25/13  Yes Historical Provider, MD  Insulin Glargine (LANTUS SOLOSTAR) 100 UNIT/ML Solostar Pen  Inject 10 Units into the skin daily at 10 pm. 07/23/13  Yes Ronnald Nian, MD  Insulin Pen Needle (BD ULTRA-FINE PEN NEEDLES) 29G X 12.7MM MISC Use daily 07/24/13  Yes Ronnald Nian, MD  Lansoprazole (PREVACID PO) Take 1 capsule by mouth at bedtime.   Yes Historical Provider, MD  levalbuterol [facility] HFA) 45 MCG/ACT inhaler Inhale 1-2 puffs into the lungs every 4 (four) hours as needed for wheezing. 10/15/12  Yes Ronnald Nian, MD  LOVAZA 1 G capsule take 4 capsules by mouth daily 07/01/13  Yes Ronnald Nian, MD  metoprolol tartrate (LOPRESSOR) 50 MG tablet Take 1 tablet (50 mg total) by mouth 2 (two) times daily. 07/24/13  Yes Hillis Range, MD  mometasone (NASONEX) 50 MCG/ACT nasal spray Place 2 sprays into the nose daily. 07/17/13  Yes [name], MD  niacin 500 MG CR capsule Take 1 capsule (500 mg total) by mouth at bedtime. 06/13/13  Yes Nada Boozer, NP  OXYGEN-HELIUM IN Inhale 3 L into the lungs continuous.   Yes Historical Provider, MD  potassium chloride SA (K-DUR,KLOR-CON) 20 MEQ tablet Take 2 tablets (40 mEq total) by mouth daily. 06/13/13  Yes Nada Boozer, NP  predniSONE (DELTASONE) 20 MG tablet Take 1 tablet (20 mg total) by mouth daily with breakfast. 08/02/13  Yes Kalman Shan, MD  Rivaroxaban (XARELTO) 15 MG TABS tablet Take 15 mg by mouth daily.   Yes  Historical Provider, MD    HOSPITAL MEDICATIONS: . busPIRone  15 mg Oral BID  . [START ON 08/23/2013] ceFEPime (MAXIPIME) IV  1 g Intravenous Q24H  . citalopram  20 mg Oral Daily  . furosemide  80 mg Intravenous Q12H  . hydrocortisone sodium succinate  50 mg Intravenous Q6H  . insulin aspart  2-6 Units Subcutaneous 6 times per day  . [START ON 08/23/2013] levofloxacin (LEVAQUIN) IV  500 mg Intravenous Q48H  . magnesium sulfate 1 - 4 g bolus IVPB  4 g Intravenous Once  . [START ON 08/23/2013] pantoprazole  40 mg Oral Q24H  . [START ON 08/18/2013] vancomycin  1,500 mg Intravenous Q48H   . sodium chloride 20 mL/hr (08/22/13 0723)  . DOBUTamine Stopped (08/22/13 1015)  . heparin 10,000 units/ 20 mL infusion syringe    . heparin 1,550 Units/hr (08/22/13 1111)  . milrinone 0.125 mcg/kg/min (08/22/13 1216)  . norepinephrine (LEVOPHED) Adult infusion Stopped (08/08/2013 2341)  . dialysis replacement fluid (prismasate)    . dialysate (PRISMASATE)    . sodium bicarbonate (isotonic) 1000 mL infusion      ROS: General: no fevers/chills/night sweats Eyes: no blurry vision, diplopia, or amaurosis ENT: no sore throat or hearing loss Resp: no cough, wheezing, or hemoptysis CV: See history of present illness GI: no abdominal pain, nausea, vomiting, diarrhea, or constipation GU: no dysuria, frequency, or hematuria Skin: no rash Neuro: no headache, numbness, tingling, or weakness of extremities Musculoskeletal: no joint pain or swelling Heme: no bleeding, DVT, or easy bruising Endo: no polydipsia or polyuria    Physical Exam: Blood pressure 84/65, pulse 108, temperature 98 F (36.7 C), temperature source Oral, resp. rate 21, height 5\' 8"  (1.727 m), weight 242 lb (109.77 kg), SpO2 100.00%.  Pt is alert and oriented, pleasant obese male, mild respiratory distress HEENT: normal Neck: JVP -unable to visualize. Carotid upstrokes normal without bruits. No thyromegaly. Lungs: equal expansion,  scattered rhonchi CV: Apex is nonpalpable, irregularly irregular without murmur or gallop Abd: soft, NT, +BS, no bruit, obese Back: no  CVA tenderness Ext: 1+ pretibial edema bilaterally        DP/PT pulses intact and = Skin: warm and dry without rash Neuro: CNII-XII intact             Strength intact = bilaterally  Labs:   Lab Results  Component Value Date   WBC 18.7* 08/22/2013   HGB 7.8* 08/22/2013   HCT 23.9* 08/22/2013   MCV 93.0 08/22/2013   PLT 86* 08/22/2013    Recent Labs Lab 08/22/13 0635  NA 138  K 5.5*  CL 101  CO2 13*  BUN 74*  CREATININE 6.07*  CALCIUM 7.1*  PROT 6.0  BILITOT 1.1  ALKPHOS 44  ALT 2100*  AST 5776*  GLUCOSE 130*   Lab Results  Component Value Date   CKTOTAL 59 06/24/2013   CKMB 3.3 06/24/2013   TROPONINI 0.41* 08/22/2013    Lab Results  Component Value Date   CHOL 186 10/09/2012   Lab Results  Component Value Date   HDL 36* 10/09/2012   Lab Results  Component Value Date   LDLCALC 89 10/09/2012   Lab Results  Component Value Date   TRIG 306* 10/09/2012   Lab Results  Component Value Date   CHOLHDL 5.2 10/09/2012   No results found for this basename: LDLDIRECT      Radiology: US Renal Port  08/22/2013   CLINICAL DATA:  Decreased urinary output  EXAM: RENAL/URINARY TRACT ULTRASOUND COMPLETE  COMPARISON:  US RENAL dated 04/18/2013  FINDINGS: Right Kidney:  The renal cortical echotexture is equal to or slightly increased when compared to that of the adjacent liver. There are multiple cysts visible with the largest measuring 3 cm in greatest dimension in the lower pole. There is no hydronephrosis.  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 No left kidney is demonstrated.  Bladder:  The urinary bladder is decompressed with a Foley catheter.  IMPRESSION: The echotexture of the solitary right kidney is mildly increased consistent with medical renal disease. There is no hydronephrosis. There are multiple cysts in  the right kidney with the largest measuring 3 cm in greatest dimension.   Electronically Signed   By: Floyd  [place]   On: 08/22/2013 10:24   Dg Chest Port 1 View  08/22/2013   CLINICAL DATA:  Shortness of breath  EXAM: PORTABLE CHEST - 1 VIEW  COMPARISON:  08-29-13  FINDINGS: Cardiac shadow is a again enlarged. Postsurgical changes are seen. A central venous catheter is noted from the left jugular vein at the junction of the innominate veins. It is stable in appearance. No pneumothorax is noted. No focal infiltrate is seen.  IMPRESSION: No change from the prior exam.   Electronically Signed   By: Alcide Clever M.D.   On: 08/22/2013 07:49   Dg Chest Portable 1 View  2013-08-29   CLINICAL DATA:  Sepsis.  Central line placement.  EXAM: PORTABLE CHEST - 1 VIEW  COMPARISON:  08-29-2013 at 0758 hr  FINDINGS: There has been interval placement of a left jugular central venous catheter with tip overlying the upper SVC with tip directed laterally. Marked enlargement of the cardiac silhouette remains. 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 Evaluation of the right lung is limited by technique. There is no definite evidence of airspace consolidation or overt edema. No large pleural effusion or pneumothorax is identified.  IMPRESSION: Interval left jugular CVC placement as above.  Cardiomegaly.   Electronically Signed   By: Sebastian Ache  On: 08/20/2013 10:49   Dg Chest Port 1 View  08/22/2013   CLINICAL DATA:  Shortness of breath, history CHF, hypertension, diabetes, hyperlipidemia, former smoker  EXAM: PORTABLE CHEST - 1 VIEW  COMPARISON:  Portable exam 0758 hr compared to 07/09/2013  FINDINGS: Enlargement of cardiac silhouette post CABG.  Minimal atherosclerotic calcification aortic arch.  Pulmonary vascularity normal.  Poor definition of the left heart border cannot exclude mild lingular infiltrate.  Remaining lungs clear.  No gross pleural effusion or pneumothorax.  IMPRESSION: Enlargement of cardiac silhouette  post CABG.  Indistinct left heart border, cannot exclude mild lingular infiltrate.   Electronically Signed   By: Ulyses Southward M.D.   On: 08/10/2013 08:07    EKG: Atrial fibrillation with nonspecific IVCD, cannot rule out age indeterminate anteroseptal MI  CT chest 08/09/2013: IMPRESSION:  1. Image quality is degraded by motion and body habitus.  2. Mild bibasilar subpleural reticulation is indicative of  interstitial lung disease such as nonspecific interstitial  pneumonitis (NSIP). Comparison with prior examination is difficult  due to acute lung disease and severe respiratory motion on that  exam.  3. Right perihilar nodule (likely right upper lobe) is unchanged in  the short interval from 06/10/2013. Followup in 6 months is  recommended to ensure continued stability.  4. Mediastinal adenopathy can be seen in the setting of interstitial  lung disease.  2-D echocardiogram 06/10/2013: Study Conclusions  - Left ventricle: The cavity size was mildly dilated. Wall thickness was increased in a pattern of mild LVH. There was mild concentric hypertrophy. Systolic function was moderately reduced. The estimated ejection fraction was in the range of 35% to 45%. There was a reduced contribution of atrial contraction to ventricular filling, due to increased ventricular diastolic pressure or atrial contractile dysfunction. Doppler parameters are consistent with a reversible restrictive pattern, indicative of decreased left ventricular diastolic compliance and/or increased left atrial pressure (grade 3 diastolic dysfunction). Doppler parameters are consistent with both elevated ventricular end-diastolic filling pressure and elevated left atrial filling pressure. Acoustic contrast opacification revealed no evidence ofthrombus. - Mitral valve: Mildly calcified annulus. Mild regurgitation. - Right ventricle: The cavity size was mildly dilated. Systolic pressure was increased. - Tricuspid  valve: Moderate regurgitation directed toward the septum. - Pulmonary arteries: PA peak pressure: 45mm Hg (S).  ASSESSMENT AND PLAN:  1. Hypoxemic respiratory failure, suspect multifactorial with left lower lobe pneumonia and congestive heart failure on a background of amiodarone lung toxicity/interstitial lung disease 2. Acute on chronic systolic heart failure with cardiogenic shock 3. Atrial fibrillation with RVR 4. Acute on chronic renal failure  The patient is critically ill with multiorgan system dysfunction. His primary management is by the critical care team. He is currently on milrinone which am not sure he will tolerate with his atrial fibrillation and rapid ventricular rate. I reviewed his heart rate trend on telemetry and there is no significant change since starting milrinone so will try to continue this for now. If he does not tolerate may need to switch to phenylephrine. Unfortunately he has extremely limited options for his atrial fibrillation. He cannot take amiodarone because of lung toxicity. He is not a candidate for Tikosyn in the setting of acute renal failure. With his LV dysfunction he is not a candidate for any other antiarrhythmic drugs, nor is he a candidate for rate controlling medications in the setting of shock. If he recovers from his acute illness, permanent pacing and AV node ablation would be a consideration. Despite his relatively  young age, I agree that his treatment options at this point are fairly limited. Will follow closely with you as he begins CVVHD for volume management.  Tonny Bollman 08/22/2013, 1:32 PM

## 2013-08-22 NOTE — Progress Notes (Signed)
ANTICOAGULATION CONSULT NOTE - Follow Up Consult  Pharmacy Consult for Heparin Indication: atrial fibrillation  Allergies  Allergen Reactions  . Amiodarone Shortness Of Breath    Pulmonary toxicity    Patient Measurements: Height: 5\' 8"  (172.7 cm) Weight: 242 lb (109.77 kg) IBW/kg (Calculated) : 68.4 Heparin Dosing Weight: 93 kg  Vital Signs: Temp: 97.3 F (36.3 C) (02/19 1911) Temp src: Oral (02/19 1911) BP: 116/61 mmHg (02/19 2200) Pulse Rate: 116 (02/19 2200) Labs:  Recent Labs  08/20/2013 0750 08/05/2013 1005  08/30/2013 1057 08/08/2013 2000 08/22/13 0635 08/22/13 0830 08/22/13 1210 08/22/13 1600 08/22/13 2200  HGB 10.0*  --   --   --   --  7.6*  --  7.8*  --   --   HCT 31.0*  --   --   --   --  24.2*  --  23.9*  --   --   PLT 295  --   --   --   --  120*  --  86*  --   --   APTT  --   --   < > 41* 66* 55*  --   --   --  75*  HEPARINUNFRC  --   --   --  >2.20*  --  1.54*  --   --   --   --   CREATININE 4.49*  --   --   --   --  6.07*  --   --  6.64*  --   TROPONINI  --  <0.30  --   --   --   --  0.41*  --   --   --   < > = values in this interval not displayed. Estimated Creatinine Clearance: 14.6 ml/min (by C-G formula based on Cr of 6.64).  Medications:  Heparin @ 1400 units/hr.   Assessment: 57 YOM switched from Xarelto to IV heparin for atrial fibrillation. Heparin level this morning decreased to 1.54units/mL- moving in the right direction, but not yet correlating with aPTT. Hgb dropped to 7.6, plts also fell to 120. Patient does have a large bruise over IV site per RN.  Heparin was held for HD cath placement and resumed at 1530 PM.   aPTT ~5 hrs after restart is therapeutic.   Goal of Therapy:  aPTT 66-102 seconds (will transition to heparin levels when correlates with aPTT) Monitor platelets by anticoagulation protocol: Yes   Plan:  1. Continue Heparin at 1550 units/hr. 2. Follow-up daily heparin level and aPTT  Link Snuffer, PharmD, BCPS Clinical  Pharmacist [phone] 08/22/2013 10:26 PM

## 2013-08-22 NOTE — Progress Notes (Addendum)
ANTICOAGULATION CONSULT NOTE - Follow Up Consult  Pharmacy Consult for Heparin Indication: atrial fibrillation  Allergies  Allergen Reactions  . Amiodarone Shortness Of Breath    Pulmonary toxicity    Patient Measurements: Height: 5\' 8"  (172.7 cm) Weight: 242 lb (109.77 kg) IBW/kg (Calculated) : 68.4 Heparin Dosing Weight: 93 kg  Vital Signs: Temp: 97.5 F (36.4 C) (02/19 0915) Temp src: Oral (02/19 0915) BP: 119/58 mmHg (02/19 0800) Pulse Rate: 116 (02/19 0800) Labs:  Recent Labs  08/18/2013 0750 08/19/2013 1005 08/10/2013 1057 08/18/2013 2000 08/22/13 0635 08/22/13 0830  HGB 10.0*  --   --   --  7.6*  --   HCT 31.0*  --   --   --  24.2*  --   PLT 295  --   --   --  120*  --   APTT  --   --  41* 66* 55*  --   HEPARINUNFRC  --   --  >2.20*  --  1.54*  --   CREATININE 4.49*  --   --   --  6.07*  --   TROPONINI  --  <0.30  --   --   --  0.41*   Estimated Creatinine Clearance: 15.9 ml/min (by C-G formula based on Cr of 6.07).  Medications:  Heparin @ 1400 units/hr.   Assessment: 57 YOM switched from Xarelto to IV heparin for atrial fibrillation. His aPTT this morning is lower than goal at 55. Heparin level this morning decreased to 1.54units/mL- moving in the right direction, but not yet correlating with aPTT. Hgb dropped to 7.6, plts also fell to 120. Patient does have a large bruise over IV site per RN.  Goal of Therapy:  aPTT 66-102 seconds (will transition to heparin levels when correlates with aPTT) Monitor platelets by anticoagulation protocol: Yes   Plan:  1. Hold heparin for about 1 hour and then resume with an increased rate of 1550 units/hr at 1100 2. Follow up aPTT in 6 hours 3. Daily CBC and aPTT/HL 4. Follow closely for s/s bleeding  Jermy Couper D. Corlis Angelica, PharmD, BCPS Clinical Pharmacist Pager: [phone] 08/22/2013 9:32 AM

## 2013-08-23 ENCOUNTER — Inpatient Hospital Stay (HOSPITAL_COMMUNITY): Payer: Managed Care, Other (non HMO)

## 2013-08-23 DIAGNOSIS — I5021 Acute systolic (congestive) heart failure: Secondary | ICD-10-CM

## 2013-08-23 DIAGNOSIS — I509 Heart failure, unspecified: Secondary | ICD-10-CM

## 2013-08-23 LAB — CBC
HEMATOCRIT: 21.1 % — AB (ref 39.0–52.0)
HEMATOCRIT: 21 % — AB (ref 39.0–52.0)
Hemoglobin: 6.9 g/dL — CL (ref 13.0–17.0)
Hemoglobin: 7 g/dL — ABNORMAL LOW (ref 13.0–17.0)
MCH: 29.5 pg (ref 26.0–34.0)
MCH: 30.3 pg (ref 26.0–34.0)
MCHC: 32.7 g/dL (ref 30.0–36.0)
MCHC: 33.3 g/dL (ref 30.0–36.0)
MCV: 90.2 fL (ref 78.0–100.0)
MCV: 90.9 fL (ref 78.0–100.0)
PLATELETS: 96 10*3/uL — AB (ref 150–400)
Platelets: 87 10*3/uL — ABNORMAL LOW (ref 150–400)
RBC: 2.34 MIL/uL — ABNORMAL LOW (ref 4.22–5.81)
RBC: 2.31 MIL/uL — AB (ref 4.22–5.81)
RDW: 16.4 % — AB (ref 11.5–15.5)
RDW: 16.3 % — AB (ref 11.5–15.5)
WBC: 17 10*3/uL — AB (ref 4.0–10.5)
WBC: 19.2 10*3/uL — AB (ref 4.0–10.5)

## 2013-08-23 LAB — STREP PNEUMONIAE URINARY ANTIGEN: Strep Pneumo Urinary Antigen: NEGATIVE

## 2013-08-23 LAB — RENAL FUNCTION PANEL
ALBUMIN: 2.5 g/dL — AB (ref 3.5–5.2)
ALBUMIN: 2.4 g/dL — AB (ref 3.5–5.2)
BUN: 67 mg/dL — ABNORMAL HIGH (ref 6–23)
BUN: 51 mg/dL — ABNORMAL HIGH (ref 6–23)
CALCIUM: 6.8 mg/dL — AB (ref 8.4–10.5)
CHLORIDE: 94 meq/L — AB (ref 96–112)
CO2: 22 mEq/L (ref 19–32)
CO2: 25 mEq/L (ref 19–32)
CREATININE: 3.94 mg/dL — AB (ref 0.50–1.35)
Calcium: 7 mg/dL — ABNORMAL LOW (ref 8.4–10.5)
Chloride: 91 mEq/L — ABNORMAL LOW (ref 96–112)
Creatinine, Ser: 4.95 mg/dL — ABNORMAL HIGH (ref 0.50–1.35)
GFR, EST AFRICAN AMERICAN: 14 mL/min — AB (ref >90)
GFR, EST AFRICAN AMERICAN: 18 mL/min — AB (ref >90)
GFR, EST NON AFRICAN AMERICAN: 12 mL/min — AB (ref >90)
GFR, EST NON AFRICAN AMERICAN: 15 mL/min — AB (ref >90)
Glucose, Bld: 129 mg/dL — ABNORMAL HIGH (ref 70–99)
Glucose, Bld: 122 mg/dL — ABNORMAL HIGH (ref 70–99)
PHOSPHORUS: 5.8 mg/dL — AB (ref 2.3–4.6)
PHOSPHORUS: 5.2 mg/dL — AB (ref 2.3–4.6)
POTASSIUM: 4.4 meq/L (ref 3.7–5.3)
Potassium: 4.6 mEq/L (ref 3.7–5.3)
SODIUM: 136 meq/L — AB (ref 137–147)
SODIUM: 137 meq/L (ref 137–147)

## 2013-08-23 LAB — GLUCOSE, CAPILLARY
GLUCOSE-CAPILLARY: 125 mg/dL — AB (ref 70–99)
Glucose-Capillary: 131 mg/dL — ABNORMAL HIGH (ref 70–99)
Glucose-Capillary: 132 mg/dL — ABNORMAL HIGH (ref 70–99)
Glucose-Capillary: 130 mg/dL — ABNORMAL HIGH (ref 70–99)
Glucose-Capillary: 146 mg/dL — ABNORMAL HIGH (ref 70–99)
Glucose-Capillary: 125 mg/dL — ABNORMAL HIGH (ref 70–99)

## 2013-08-23 LAB — HEPATIC FUNCTION PANEL
ALBUMIN: 2.5 g/dL — AB (ref 3.5–5.2)
ALT: 1423 U/L — ABNORMAL HIGH (ref 0–53)
AST: 1561 U/L — AB (ref 0–37)
Alkaline Phosphatase: 46 U/L (ref 39–117)
Bilirubin, Direct: 0.7 mg/dL — ABNORMAL HIGH (ref 0.0–0.3)
Indirect Bilirubin: 0.5 mg/dL (ref 0.3–0.9)
Total Bilirubin: 1.2 mg/dL (ref 0.3–1.2)
Total Protein: 5.8 g/dL — ABNORMAL LOW (ref 6.0–8.3)

## 2013-08-23 LAB — URINE CULTURE
CULTURE: NO GROWTH
Colony Count: NO GROWTH

## 2013-08-23 LAB — APTT: APTT: 95 s — AB (ref 24–37)

## 2013-08-23 LAB — HEPARIN LEVEL (UNFRACTIONATED): HEPARIN UNFRACTIONATED: 0.9 [IU]/mL — AB (ref 0.30–0.70)

## 2013-08-23 LAB — MAGNESIUM: Magnesium: 2.2 mg/dL (ref 1.5–2.5)

## 2013-08-23 LAB — TROPONIN I

## 2013-08-23 MED ORDER — METOPROLOL TARTRATE 1 MG/ML IV SOLN
INTRAVENOUS | Status: AC
  Filled 2013-08-23: qty 5

## 2013-08-23 MED ORDER — ESMOLOL HCL-SODIUM CHLORIDE 2000 MG/100ML IV SOLN
25.0000 ug/kg/min | INTRAVENOUS | Status: DC
  Administered 2013-08-23: 25 ug/kg/min via INTRAVENOUS
  Administered 2013-08-23: 75 ug/kg/min via INTRAVENOUS
  Administered 2013-08-23: 100 ug/kg/min via INTRAVENOUS
  Administered 2013-08-24 (×5): 150 ug/kg/min via INTRAVENOUS
  Filled 2013-08-23 (×9): qty 100

## 2013-08-23 MED ORDER — PHENYLEPHRINE HCL 10 MG/ML IJ SOLN
30.0000 ug/min | INTRAVENOUS | Status: DC
  Administered 2013-08-23: 30 ug/min via INTRAVENOUS
  Administered 2013-08-24: 200 ug/min via INTRAVENOUS
  Administered 2013-08-24: 150 ug/min via INTRAVENOUS
  Administered 2013-08-24: 200 ug/min via INTRAVENOUS
  Filled 2013-08-23 (×4): qty 4

## 2013-08-23 MED ORDER — METOPROLOL TARTRATE 1 MG/ML IV SOLN
5.0000 mg | INTRAVENOUS | Status: DC
  Administered 2013-08-23: 5 mg via INTRAVENOUS

## 2013-08-23 MED ORDER — HEPARIN SODIUM (PORCINE) 5000 UNIT/ML IJ SOLN
5000.0000 [IU] | Freq: Three times a day (TID) | INTRAMUSCULAR | Status: DC
  Administered 2013-08-23 – 2013-08-24 (×3): 5000 [IU] via SUBCUTANEOUS
  Filled 2013-08-23 (×6): qty 1

## 2013-08-23 MED ORDER — PHENYLEPHRINE HCL 10 MG/ML IJ SOLN
30.0000 ug/min | INTRAVENOUS | Status: DC
  Administered 2013-08-23: 30 ug/min via INTRAVENOUS
  Filled 2013-08-23 (×2): qty 1

## 2013-08-23 MED ORDER — ESMOLOL BOLUS VIA INFUSION
500.0000 ug/kg | Freq: Once | INTRAVENOUS | Status: AC
  Administered 2013-08-23: 57550 ug via INTRAVENOUS
  Filled 2013-08-23: qty 58000

## 2013-08-23 NOTE — Progress Notes (Signed)
CRITICAL VALUE ALERT  Critical value received:  hgb 6.9  Date of notification:  08/23/13  Time of notification:  0432  Critical value read back:yes  Nurse who received alert:  Fayrene Fearing Artis/Eissa Buchberger Marcelle Overlie RN  MD notified (1st page):  Dr. Verdell Carmine)  Time of first page:  0500  MD notified (2nd page):NA  Time of second page:NA  Responding MD:  Dr. Herma Carson  Time MD responded:  0500

## 2013-08-23 NOTE — Progress Notes (Signed)
TELEMETRY: Reviewed telemetry pt in atrial fibrillation with RVR: Filed Vitals:   08/23/13 1100 08/23/13 1137 08/23/13 1211 08/23/13 1223  BP: 115/67  117/59   Pulse: 128 132 133   Temp:    98.1 F (36.7 [name])  TempSrc:    Oral  Resp: 19 17 18    Height:      Weight:      SpO2: 91% 91% 91%     Intake/Output Summary (Last 24 hours) at 08/23/13 1325 Last data filed at 08/23/13 1300  Gross per 24 hour  Intake 1409.6 ml  Output   1903 ml  Net -493.4 ml    SUBJECTIVE On CPAP. Still complains of SOB. No chest pain. Now on CVVHD.  LABS: Basic Metabolic Panel:  Recent Labs  16/10/96 0635 08/22/13 1600 08/23/13 0345  NA 138 133* 136*  K 5.5* 4.9 4.4  CL 101 97 94*  CO2 13* 12* 22  GLUCOSE 130* 159* 129*  BUN 74* 81* 67*  CREATININE 6.07* 6.64* 4.95*  CALCIUM 7.1* 6.9* 7.0*  MG 1.1*  --  2.2  PHOS 8.6* 9.9* 5.8*   Liver Function Tests:  Recent Labs  08/22/13 0635  08/23/13 0345 08/23/13 0900  AST 5776*  --   --  1561*  ALT 2100*  --   --  1423*  ALKPHOS 44  --   --  46  BILITOT 1.1  --   --  1.2  PROT 6.0  --   --  5.8*  ALBUMIN 2.5*  < > 2.5* 2.5*  < > = values in this interval not displayed. 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 Shortness of breath. Evaluate for interstitial lung disease.  EXAM: CHEST CT WITHOUT CONTRAST  TECHNIQUE: Multidetector CT imaging of the chest was performed following the standard protocol without intravenous contrast. High resolution imaging of the lungs,  as well as inspiratory and expiratory imaging, was performed.  COMPARISON:  CT CHEST HIGH RESOLUTION W/O CM dated 06/10/2013  FINDINGS: Mediastinal lymph nodes measure up to 1.3 cm in the lower right paratracheal station, similar to the prior exam. Hilar regions are difficult to definitively evaluate in the absence of IV contrast. No axillary adenopathy. Atherosclerotic calcification of the arterial vasculature. Ascending aorta and pulmonary arteries are at the upper limits of normal in size. Heart is enlarged. No pericardial effusion. Mild thickening of the distal esophageal wall can be seen with gastroesophageal reflux disease.  Centrilobular emphysema with mild bullous changes at the apices. Image quality is degraded by motion and body habitus. An 8 mm (7 x 9 mm) nodule in the right perihilar region (image 23) appears to be within the right upper lobe and is stable from 06/10/2013. Mild bibasilar predominant subpleural reticulation. Very mild architectural distortion in association. No definite traction bronchiolectasis or honeycombing. No air trapping on inspiratory and expiratory imaging. No pleural fluid. Airway is unremarkable.  Incidental imaging of the upper abdomen shows the  visualized portions of the liver and adrenal glands to be unremarkable. A 2.1 cm exophytic lesion off the upper pole right kidney is incompletely imaged but grossly stable in size. Visualized portions of the spleen, pancreas and stomach are grossly unremarkable. No upper abdominal adenopathy. No worrisome lytic or sclerotic lesions. Degenerative changes are seen in the spine. Median sternotomy. Left first and second anterior ribs appear fused.  IMPRESSION: 1. Image quality is degraded by motion and body habitus. 2. Mild bibasilar subpleural reticulation is indicative of interstitial lung disease such as nonspecific interstitial pneumonitis (NSIP). Comparison with prior examination is difficult due to acute lung disease and severe  respiratory motion on that exam. 3. Right perihilar nodule (likely right upper lobe) is unchanged in the short interval from 06/10/2013. Followup in 6 months is recommended to ensure continued stability. 4. Mediastinal adenopathy can be seen in the setting of interstitial lung disease.   Electronically Signed   By: Leanna Battles M.D.   On: 08/09/2013 13:23   US Renal Port  08/22/2013   CLINICAL DATA:  Decreased urinary output  EXAM: RENAL/URINARY TRACT ULTRASOUND COMPLETE  COMPARISON:  US RENAL dated 04/18/2013  FINDINGS: Right Kidney:  The renal cortical echotexture is equal to or slightly increased when compared to that of the adjacent liver. There are multiple cysts visible with the largest measuring 3 cm in greatest dimension in the lower pole. There is no hydronephrosis.  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 No left kidney is demonstrated.  Bladder:  The urinary bladder is decompressed with a Foley catheter.  IMPRESSION: The echotexture of the solitary right kidney is mildly increased consistent with medical renal disease. There is no hydronephrosis. There are multiple cysts in the right kidney with the largest measuring 3 cm in greatest dimension.   Electronically Signed   By: Thermon  [place]   On: 08/22/2013 10:24   Dg Chest Port 1 View  08/23/2013   CLINICAL DATA:  Followup edema  EXAM: PORTABLE CHEST - 1 VIEW  COMPARISON:  Portable chest x-ray of 08/22/2013  FINDINGS: There are now more prominent interstitial markings throughout the lungs most consistent with worsening of interstitial edema. More prominent markings at the lung bases may reflect edema, atelectasis, or pneumonia and followup is recommended. Cardiomegaly is stable. Left central venous line tip remains in the proximal SVC.  IMPRESSION: Worsening of interstitial markings consistent with worsening of interstitial edema. Probable bibasilar atelectasis. Recommend followup.   Electronically Signed   By:  Dwyane Dee M.D.   On: 08/23/2013 08:21   Dg Chest Port 1 View  08/22/2013   CLINICAL DATA:  Shortness of breath  EXAM: PORTABLE CHEST - 1 VIEW  COMPARISON:  2013-09-14  FINDINGS: Cardiac shadow is a again enlarged. Postsurgical changes are seen. A central venous catheter is noted from the left jugular vein at the junction of the innominate veins. It is stable in appearance. No pneumothorax is noted. No focal infiltrate is seen.  IMPRESSION: No change from the prior exam.   Electronically Signed   By: Alcide Clever M.D.   On: 08/22/2013 07:49   Dg Chest Portable 1 View  2013/09/14   CLINICAL DATA:  Sepsis.  Central line placement.  EXAM: PORTABLE CHEST - 1 VIEW  COMPARISON:  Sep 14, 2013 at 0758 hr  FINDINGS: There has been interval placement of a left jugular central venous catheter with tip overlying the upper SVC with tip directed laterally. Marked enlargement of the cardiac silhouette  remains. 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 Evaluation of the right lung is limited by technique. There is no definite evidence of airspace consolidation or overt edema. No large pleural effusion or pneumothorax is identified.  IMPRESSION: Interval left jugular CVC placement as above.  Cardiomegaly.   Electronically Signed   By: Sebastian AcheAllen  Grady   On: 08/31/2013 10:49   Dg Chest Port 1 View  08/23/2013   CLINICAL DATA:  Shortness of breath, history CHF, hypertension, diabetes, hyperlipidemia, former smoker  EXAM: PORTABLE CHEST - 1 VIEW  COMPARISON:  Portable exam 0758 hr compared to 07/09/2013  FINDINGS: Enlargement of cardiac silhouette post CABG.  Minimal atherosclerotic calcification aortic arch.  Pulmonary vascularity normal.  Poor definition of the left heart border cannot exclude mild lingular infiltrate.  Remaining lungs clear.  No gross pleural effusion or pneumothorax.  IMPRESSION: Enlargement of cardiac silhouette post CABG.  Indistinct left heart border, cannot exclude mild lingular infiltrate.   Electronically  Signed   By: Ulyses SouthwardMark  Boles M.D.   On: 08/26/2013 08:07   Ecg: 2.18/15 afib with RVR, nonspecific IVCD  Transthoracic Echocardiography  Patient: [name] [name], [name] [name] MR #: [id number] Study Date: 06/10/2013 Gender: M Age: 58 Height: 172.7cm Weight: 109.1kg BSA: 2.7478m^2 Pt. Status: Room: 4E03C  PERFORMING Shvc ORDERING Leone BrandIngold, Laura R. SONOGRAPHER Cathie BeamsGregory, Angela ADMITTING Hilty, Iantha FallenKenneth ATTENDING Zoila ShutterHilty, Kenneth cc:  ------------------------------------------------------------ LV EF: 35% - 45%  ------------------------------------------------------------ Indications: CHF - 428.0.  ------------------------------------------------------------ History: PMH: Chronic anticoagulation. Nonischemic cardiomyopathy. Chronic kidney disease. Hypoxia. Sleep apnea. Atrophy of left kidney. Atrial fibrillation. Risk factors: Hypertension. Diabetes mellitus. Dyslipidemia.  ------------------------------------------------------------ Study Conclusions  - Left ventricle: The cavity size was mildly dilated. Wall thickness was increased in a pattern of mild LVH. There was mild concentric hypertrophy. Systolic function was moderately reduced. The estimated ejection fraction was in the range of 35% to 45%. There was a reduced contribution of atrial contraction to ventricular filling, due to increased ventricular diastolic pressure or atrial contractile dysfunction. Doppler parameters are consistent with a reversible restrictive pattern, indicative of decreased left ventricular diastolic compliance and/or increased left atrial pressure (grade 3 diastolic dysfunction). Doppler parameters are consistent with both elevated ventricular end-diastolic filling pressure and elevated left atrial filling pressure. Acoustic contrast opacification revealed no evidence ofthrombus. - Mitral valve: Mildly calcified annulus. Mild regurgitation. - Right ventricle: The cavity size was mildly dilated. Systolic  pressure was increased. - Tricuspid valve: Moderate regurgitation directed toward the septum. - Pulmonary arteries: PA peak pressure: 45mm Hg (S).  ------------------------------------------------------------ Labs, prior tests, procedures, and surgery: Coronary artery bypass grafting.  Transthoracic echocardiography. M-mode, complete 2D, spectral Doppler, and color Doppler. Height: Height: 172.7cm. Height: 68in. Weight: Weight: 109.1kg. Weight: 240lb. Body mass index: BMI: 36.6kg/m^2. Body surface area: BSA: 2.1278m^2. Blood pressure: 133/52. Patient status: Inpatient. Location: Echo laboratory.  ------------------------------------------------------------  ------------------------------------------------------------ Left ventricle: The cavity size was mildly dilated. Wall thickness was increased in a pattern of mild LVH. There was mild concentric hypertrophy. Systolic function was moderately reduced. The estimated ejection fraction was in the range of 35% to 45%. Images were inadequate for LV wall motion assessment. Acoustic contrast opacification revealed no evidence ofthrombus. There was a reduced contribution of atrial contraction to ventricular filling, due to increased ventricular diastolic pressure or atrial contractile dysfunction. Early diastolic septal annular tissue Doppler velocities Ea were abnormal. Doppler parameters are consistent with a reversible restrictive pattern, indicative of decreased left ventricular diastolic compliance and/or increased left atrial pressure (grade 3 diastolic dysfunction). Doppler parameters are consistent with  both elevated ventricular end-diastolic filling pressure and elevated left atrial filling pressure.  ------------------------------------------------------------ Aortic valve: Trileaflet; mildly thickened leaflets. Doppler: There was no stenosis. No  regurgitation.  ------------------------------------------------------------ Aorta: The aorta was normal, not dilated, and non-diseased.  ------------------------------------------------------------ Mitral valve: Mildly calcified annulus. Doppler: Mild regurgitation. Peak gradient: 7mm Hg (D).  ------------------------------------------------------------ Left atrium: The atrium was normal in size.  ------------------------------------------------------------ Atrial septum: Poorly visualized.  ------------------------------------------------------------ Right ventricle: The cavity size was mildly dilated. Systolic function was normal. Systolic pressure was increased.  ------------------------------------------------------------ Pulmonic valve: Poorly visualized. Doppler: No significant regurgitation.  ------------------------------------------------------------ Tricuspid valve: Doppler: Moderate regurgitation directed toward the septum.  ------------------------------------------------------------ Pulmonary artery: Poorly visualized.  ------------------------------------------------------------ Right atrium: The atrium was at the upper limits of normal in size.  ------------------------------------------------------------ Pericardium: There was no pericardial effusion.  ------------------------------------------------------------ Systemic veins: Inferior vena cava: The vessel was normal in size; the respirophasic diameter changes were in the normal range (= 50%); findings are consistent with normal central venous pressure.  ------------------------------------------------------------ Post procedure conclusions Ascending Aorta:  - The aorta was normal, not dilated, and non-diseased.  ------------------------------------------------------------  2D measurements Normal Doppler Normal Left ventricle measurements LVID ED, 52.5 mm 43-52 Main pulmonary chord,  artery PLAX Pressure, S 45 mm =30 LVID ES, 42.2 mm 23-38 Hg chord, Left ventricle PLAX Ea, lat 12.3 cm/ ------- FS, chord, 20 % >29 ann, tiss s PLAX DP LVPW, ED 13.6 mm ------ E/Ea, lat 10.98 ------- IVS/LVPW 0.81 <1.3 ann, tiss ratio, ED DP Ventricular septum Ea, med 7.68 cm/ ------- IVS, ED 11 mm ------ ann, tiss s Aorta DP Root diam, 35 mm ------ E/Ea, med 17.58 ------- ED ann, tiss Left atrium DP AP dim 29 mm ------ Mitral valve AP dim 1.24 cm/m^2 <2.2 Peak E vel 135 cm/ ------- index s Peak A vel 37.8 cm/ ------- s Deceleratio 92 ms 150-230 n time Peak 7 mm ------- gradient, D Hg Peak E/A 3.6 ------- ratio Tricuspid valve Regurg peak 318 cm/ ------- vel s Peak RV-RA 40 mm ------- gradient, S Hg Systemic veins Estimated 5 mm ------- CVP Hg Right ventricle Pressure, S 45 mm <30 Hg Sa vel, lat 11.3 cm/ ------- ann, tiss s DP  ------------------------------------------------------------ Prepared and Electronically Authenticated by  Nicki Guadalajara 2014-12-08T17:[id number]  PHYSICAL EXAM General: Chronically ill, obese WM, on CPAP. Head: Normocephalic, atraumatic, sclera non-icteric, no xanthomas, nares are without discharge. Neck: Negative for carotid bruits. JVD is elevated. Lungs: Diminished BS bilateral with bilateral crackles.  Heart: IRRR, tachy, S1 S2 without murmurs, rubs, or gallops.  Abdomen: Soft, non-tender, non-distended with normoactive bowel sounds.  No obvious abdominal masses. Msk:  Strength and tone appears normal for age. Extremities: 1+ edema.  Distal pedal pulses are 2+ and equal bilaterally. Neuro: Alert and oriented X 3. Moves all extremities spontaneously. Psych:  Responds to questions appropriately with a normal affect.  ASSESSMENT AND PLAN: 1. Cardiogenic shock. On IV milrinone. Currently receiving CVVHD for ARF. Baseline EF 35-45% by Echo in 12/14. Volume overloaded by exam and CXR.  Repeat Echo ordered but cancelled. I think Echo  will not be very helpful with his high HR. Image quality will be limited given his body habitus. Consider repeating Echo when clinical status and HR better. 2. Atrial fibrillation with RVR. Unfortunately he has extremely limited options for his atrial fibrillation. He cannot take amiodarone because of lung toxicity. He is not a candidate for Tikosyn in the setting of acute renal failure. With his LV dysfunction he is not a candidate  for any other antiarrhythmic drugs, nor is he a candidate for rate controlling medications in the setting of shock. If he recovers from his acute illness, permanent pacing and AV node ablation would be a consideration. Currently he is on Esmolol IV drip due to sustained HR >150. This has improved to the 120s. Will watch BP closely- may need Neo if BP doesn't tolerate but we have little else to offer for rate control. Multiple stressors and anemia are contributing to high HR.  3. Acute hypoxic respiratory failure- on CPAP 4. Acute on chronic renal failure.  5. Shock liver. 6. CAD s/p CABG- cath in October 2014 showed patent grafts.  7. Morbid obesity. 8. Severe anemia.  Active Problems:   Pneumonia, organism unspecified   Septic shock   Acute on chronic renal failure    Signed, Dhruvi Crenshaw [place] MD,FACC 08/23/2013 1:40 PM

## 2013-08-23 NOTE — Progress Notes (Signed)
Patient ID: [name] BachelorDavid C [name], male   DOB: 1956/05/14, 58 y.o.   MRN: [id number] S:feels a little better today O:BP 109/54  Pulse 123  Temp(Src) 97.9 F (36.6 C) (Oral)  Resp 17  Ht 5\' 8"  (1.727 m)  Wt 115.1 kg (253 lb 12 oz)  BMI 38.59 kg/m2  SpO2 94%  Intake/Output Summary (Last 24 hours) at 08/23/13 0901 Last data filed at 08/23/13 0800  Gross per 24 hour  Intake 1322.53 ml  Output   1298 ml  Net  24.53 ml   Intake/Output: I/O last 3 completed shifts: In: 2933.8 [I.V.:2733.8; IV Piggyback:200] Out: 1288 [Urine:156; Other:1132]  Intake/Output this shift:  Total I/O In: 39.6 [I.V.:39.6] Out: 78 [Other:78] Weight change: 5.329 kg (11 lb 12 oz) Gen:WD obese WM wearing BiPap  JXB:JYNWGCVS:tachy, no rub Resp:crackles at left base, occ rhonchi NFA:OZHYQMVHQ/IONGEAbd:distended/obese, NT Ext:1+pretib edema   Recent Labs Lab 08/05/2013 0750 08/22/13 0635 08/22/13 1600 08/23/13 0345  NA 135* 138 133* 136*  K 3.8 5.5* 4.9 4.4  CL 99 101 97 94*  CO2 14* 13* 12* 22  GLUCOSE 111* 130* 159* 129*  BUN 66* 74* 81* 67*  CREATININE 4.49* 6.07* 6.64* 4.95*  ALBUMIN  --  2.5* 2.4* 2.5*  CALCIUM 9.0 7.1* 6.9* 7.0*  PHOS  --  8.6* 9.9* 5.8*  AST  --  5776*  --   --   ALT  --  2100*  --   --    Liver Function Tests:  Recent Labs Lab 08/22/13 0635 08/22/13 1600 08/23/13 0345  AST 5776*  --   --   ALT 2100*  --   --   ALKPHOS 44  --   --   BILITOT 1.1  --   --   PROT 6.0  --   --   ALBUMIN 2.5* 2.4* 2.5*   No results found for this basename: LIPASE, AMYLASE,  in the last 168 hours No results found for this basename: AMMONIA,  in the last 168 hours CBC:  Recent Labs Lab 08/23/2013 0750 08/22/13 0635 08/22/13 1210 08/23/13 0345  WBC 13.8* 19.8* 18.7* 17.0*  NEUTROABS  --  18.2*  --   --   HGB 10.0* 7.6* 7.8* 6.9*  HCT 31.0* 24.2* 23.9* 21.1*  MCV 93.1 93.1 93.0 90.2  PLT 295 120* 86* 87*   Cardiac Enzymes:  Recent Labs Lab 08/27/2013 1005 08/22/13 0830  TROPONINI <0.30 0.41*    CBG:  Recent Labs Lab 08/22/13 1554 08/22/13 1902 08/23/13 0019 08/23/13 0355 08/23/13 0758  GLUCAP 195* 136* 131* 132* 130*    Iron Studies: No results found for this basename: IRON, TIBC, TRANSFERRIN, FERRITIN,  in the last 72 hours Studies/Results: [place] Renal Port  08/22/2013   CLINICAL DATA:  Decreased urinary output  EXAM: RENAL/URINARY TRACT ULTRASOUND COMPLETE  COMPARISON:  US RENAL dated 04/18/2013  FINDINGS: Right Kidney:  The renal cortical echotexture is equal to or slightly increased when compared to that of the adjacent liver. There are multiple cysts visible with the largest measuring 3 cm in greatest dimension in the lower pole. There is no hydronephrosis.  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 No left kidney is demonstrated.  Bladder:  The urinary bladder is decompressed with a Foley catheter.  IMPRESSION: The echotexture of the solitary right kidney is mildly increased consistent with medical renal disease. There is no hydronephrosis. There are multiple cysts in the right kidney with the largest measuring 3 cm in greatest  dimension.   Electronically Signed   By: Averey  [place]   On: 08/22/2013 10:24   Dg Chest Port 1 View  08/23/2013   CLINICAL DATA:  Followup edema  EXAM: PORTABLE CHEST - 1 VIEW  COMPARISON:  Portable chest x-ray of 08/22/2013  FINDINGS: There are now more prominent interstitial markings throughout the lungs most consistent with worsening of interstitial edema. More prominent markings at the lung bases may reflect edema, atelectasis, or pneumonia and followup is recommended. Cardiomegaly is stable. Left central venous line tip remains in the proximal SVC.  IMPRESSION: Worsening of interstitial markings consistent with worsening of interstitial edema. Probable bibasilar atelectasis. Recommend followup.   Electronically Signed   By: Dwyane Dee M.D.   On: 08/23/2013 08:21   Dg Chest Port 1 View  08/22/2013   CLINICAL DATA:   Shortness of breath  EXAM: PORTABLE CHEST - 1 VIEW  COMPARISON:  09-16-2013  FINDINGS: Cardiac shadow is a again enlarged. Postsurgical changes are seen. A central venous catheter is noted from the left jugular vein at the junction of the innominate veins. It is stable in appearance. No pneumothorax is noted. No focal infiltrate is seen.  IMPRESSION: No change from the prior exam.   Electronically Signed   By: Alcide Clever M.D.   On: 08/22/2013 07:49   Dg Chest Portable 1 View  2013/09/16   CLINICAL DATA:  Sepsis.  Central line placement.  EXAM: PORTABLE CHEST - 1 VIEW  COMPARISON:  2013/09/16 at 0758 hr  FINDINGS: There has been interval placement of a left jugular central venous catheter with tip overlying the upper SVC with tip directed laterally. Marked enlargement of the cardiac silhouette remains. 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 Evaluation of the right lung is limited by technique. There is no definite evidence of airspace consolidation or overt edema. No large pleural effusion or pneumothorax is identified.  IMPRESSION: Interval left jugular CVC placement as above.  Cardiomegaly.   Electronically Signed   By: Sebastian Ache   On: 2013-09-16 10:49   . antiseptic oral rinse  15 mL Mouth Rinse q12n4p  . busPIRone  15 mg Oral BID  . ceFEPime (MAXIPIME) IV  2 g Intravenous Q12H  . chlorhexidine  15 mL Mouth Rinse BID  . citalopram  20 mg Oral Daily  . hydrocortisone sodium succinate  50 mg Intravenous Q6H  . insulin aspart  2-6 Units Subcutaneous 6 times per day  . levofloxacin (LEVAQUIN) IV  250 mg Intravenous Q24H  . pantoprazole  40 mg Oral Q24H  . vancomycin  1,000 mg Intravenous Q24H    BMET    Component Value Date/Time   NA 136* 08/23/2013 0345   K 4.4 08/23/2013 0345   CL 94* 08/23/2013 0345   CO2 22 08/23/2013 0345   GLUCOSE 129* 08/23/2013 0345   BUN 67* 08/23/2013 0345   CREATININE 4.95* 08/23/2013 0345   CREATININE 2.36* 08/06/2013 1447   CALCIUM 7.0* 08/23/2013 0345   GFRNONAA  12* 08/23/2013 0345   GFRAA 14* 08/23/2013 0345   CBC    Component Value Date/Time   WBC 17.0* 08/23/2013 0345   RBC 2.34* 08/23/2013 0345   HGB 6.9* 08/23/2013 0345   HCT 21.1* 08/23/2013 0345   PLT 87* 08/23/2013 0345   MCV 90.2 08/23/2013 0345   MCH 29.5 08/23/2013 0345   MCHC 32.7 08/23/2013 0345   RDW 16.4* 08/23/2013 0345   LYMPHSABS 1.0 08/22/2013 0635   MONOABS 0.6 08/22/2013 0635   EOSABS  0.0 08/22/2013 0635   BASOSABS 0.0 08/22/2013 1610     Assessment/Plan:  1. AKI/CKD in setting of SIRS on pressors- oliguric.  1. Tolerating CVVHD well and volume removal although remains oliguric.  2. [name] [name] again informs me that he would not be interested in longterm HD but is fine with continuing with CVVHD for now. 3. Cont with short-term CVVHD to help correct his metabolic derangements and improve his volume status in hopes that his solitary kidney will start to show some recovery soon.   4. Renal dose meds and avoid nephrotoxic agents 2. Hyperkalemia- due to #1 as above, resolved with CRRT 1. Stop KCl supplements 3. Metabolic acidosis- gap.  1. Improved with cvvhd and stop IV bicarb peripherally to avoid volume xs 4. SIRS- cont with abx and pressors per PCCM 5. A Fib with RVR- consult cardiology 6. PNA- as above 7. Dispo- pt is aware of his multiple medical problems and has a limited code  8.   Linsay Vogt A

## 2013-08-23 NOTE — Progress Notes (Signed)
ANTICOAGULATION CONSULT NOTE - Follow Up Consult  Pharmacy Consult for Heparin Indication: atrial fibrillation  Allergies  Allergen Reactions  . Amiodarone Shortness Of Breath    Pulmonary toxicity    Patient Measurements: Height: 5\' 8"  (172.7 cm) Weight: 253 lb 12 oz (115.1 kg) IBW/kg (Calculated) : 68.4 Heparin Dosing Weight: 93 kg  Vital Signs: Temp: 97.4 F (36.3 C) (02/20 0356) Temp src: Oral (02/20 0356) BP: 111/51 mmHg (02/20 0700) Pulse Rate: 102 (02/20 0700) Labs:  Recent Labs  08/29/2013 1005 08/29/13 1057  08/22/13 0635 08/22/13 0830 08/22/13 1210 08/22/13 1600 08/22/13 2200 08/23/13 0345  HGB  --   --   --  7.6*  --  7.8*  --   --  6.9*  HCT  --   --   --  24.2*  --  23.9*  --   --  21.1*  PLT  --   --   --  120*  --  86*  --   --  87*  APTT  --  41*  < > 55*  --   --   --  75* 95*  HEPARINUNFRC  --  >2.20*  --  1.54*  --   --   --   --  0.90*  CREATININE  --   --   --  6.07*  --   --  6.64*  --  4.95*  TROPONINI <0.30  --   --   --  0.41*  --   --   --   --   < > = values in this interval not displayed. Estimated Creatinine Clearance: 20 ml/min (by C-G formula based on Cr of 4.95).  Medications:  Heparin @ 1550 units/hr.   Assessment: 57 YOM switched from Xarelto to IV heparin for atrial fibrillation. Heparin held yesterday for placement of HD cath- resumed at 1730 last evening at rate of 1550 units/hr. Heparin level this morning decreased to 0.9units/mL with an aPTT of 95 seconds (therapeutic). These are beginning to correlate, but are not totally there yet. Hgb dropped to 6.9, plts also fell to 87. Patient does have a large bruise over IV site reported yesterday. No other overt bleeding noted.  Goal of Therapy:  aPTT 66-102 seconds (will transition to heparin levels only when correlates with aPTT) Monitor platelets by anticoagulation protocol: Yes   Plan:  1. Continue heparin at 1550 units/hr 2. Heparin level and aPTT in 6 hours to confirm 3.  Daily heparin level/aPTT and CBC 4. Follow closely for s/s bleeding  [name] [name], PharmD, BCPS Clinical Pharmacist Pager: [phone] 08/23/2013 7:48 AM

## 2013-08-23 NOTE — Progress Notes (Signed)
Pt taken off Bipap at this time per MD order, Spo2 quickly decreased to 70% on 6L [place], HR 160, pt placed back on Bipap at this time, increased Fio2 to 60%, Spo2 89-91%, RT will continue to monitor

## 2013-08-23 NOTE — Progress Notes (Addendum)
Name: Joretta BachelorDavid C Carandang MRN: [id number] DOB: [birth year]    ADMISSION DATE:  08/23/2013 CONSULTATION DATE: 08/31/2013   REFERRING MD : Dr. Lynelle DoctorKnapp   PRIMARY SERVICE: PCCM   CHIEF COMPLAINT: SOB and hypotension.   BRIEF PATIENT DESCRIPTION: 58 year old male with extensive PMH and previous diagnosis of amiodarone induced pulmonary toxicity. Patient was in normal state of health until day PTP where he started noticing increase WOB and feeling dizzy and SOB. Patient came to the ED where he was found to have a SBP in the 60's. Lactic acid was elevated. Patient initially responded to IVF but then BP dropped again. Patient is chronically on steroids.   SIGNIFICANT EVENTS / STUDIES:  2/18 Septic shock from PNA.  2/18 Dobutamine  Infusion 2/18 C. Diff negative 2/19 Renal US: The echotexture of the solitary right kidney is mildly increased  consistent with medical renal disease. There is no hydronephrosis.  There are multiple cysts in the right kidney with the largest  measuring 3 cm in greatest dimension. 2/19- liver enzymes elevated, ARF worsening, distress, cvvhd 2/20 rvr remains   LINES / TUBES:  L IJ TLC 2/18>>>   CULTURES:  Blood 2/18>>> GRAM POSITIVE COCCI IN CLUSTERS X1 only Urine 2/18>>> NGTD  Sputum 2/18>>> needs to be collected  ANTIBIOTICS:  Cefepime 2/18>>>  Vanc 2/18>>>  Levaquin 2/18>>>   SUBJECTIVE: remains with resp failure  VITAL SIGNS: Temp:  [97.3 F (36.3 C)-98.1 F (36.7 C)] 97.4 F (36.3 C) (02/20 0356) Pulse Rate:  [56-153] 120 (02/20 0744) Resp:  [14-32] 19 (02/20 0744) BP: (84-127)/(29-89) 111/51 mmHg (02/20 0744) SpO2:  [85 %-100 %] 93 % (02/20 0744) Weight:  [253 lb 12 oz (115.1 kg)] 253 lb 12 oz (115.1 kg) (02/20 0430) HEMODYNAMICS: CVP:  [16 mmHg-25 mmHg] 21 mmHg VENTILATOR SETTINGS:   INTAKE / OUTPUT: Intake/Output     02/19 0701 - 02/20 0700 02/20 0701 - 02/21 0700   I.V. (mL/kg) 1313.6 (11.4)    IV Piggyback 200    Total Intake(mL/kg)  1513.6 (13.2)    Urine (mL/kg/hr) 88 (0)    Other 1132 (0.4)    Total Output 1220     Net +293.6          Stool Occurrence 3 x     BP 109/54  Pulse 123  Temp(Src) 97.4 F (36.3 C) (Oral)  Resp 17  Ht 5\' 8"  (1.727 m)  Wt 253 lb 12 oz (115.1 kg)  BMI 38.59 kg/m2  SpO2 94%  PHYSICAL EXAMINATION: General: Chronically ill appearing obese male. distress Neuro: Alert and interactive, moving all ext to command.  HEENT: jvd plus Cardiovascular: Tachy, IRIR, Nl S1/S2, -M/R/G.  Lungs: Decreased at the bases, L>R crackles.  Abdomen: Soft, NT, ND and +BS.  Musculoskeletal: Intact.  Skin: Intact.   LABS:  CBC  Recent Labs Lab 08/22/13 0635 08/22/13 1210 08/23/13 0345  WBC 19.8* 18.7* 17.0*  HGB 7.6* 7.8* 6.9*  HCT 24.2* 23.9* 21.1*  PLT 120* 86* 87*   Coag's  Recent Labs Lab 08/22/13 0635 08/22/13 2200 08/23/13 0345  APTT 55* 75* 95*   BMET  Recent Labs Lab 08/22/13 0635 08/22/13 1600 08/23/13 0345  NA 138 133* 136*  K 5.5* 4.9 4.4  CL 101 97 94*  CO2 13* 12* 22  BUN 74* 81* 67*  CREATININE 6.07* 6.64* 4.95*  GLUCOSE 130* 159* 129*   Electrolytes  Recent Labs Lab 08/22/13 0635 08/22/13 1600 08/23/13 0345  CALCIUM 7.1* 6.9* 7.0*  MG 1.1*  --  2.2  PHOS 8.6* 9.9* 5.8*   Sepsis Markers  Recent Labs Lab 08/28/2013 1005 08/29/2013 1630 08/10/2013 2000  LATICACIDVEN 6.6* 5.0* 4.9*   ABG  Recent Labs Lab 08/26/2013 0813  PHART 7.444  PCO2ART 22.2*  PO2ART 102.0*   Liver Enzymes  Recent Labs Lab 08/22/13 0635 08/22/13 1600 08/23/13 0345  AST 5776*  --   --   ALT 2100*  --   --   ALKPHOS 44  --   --   BILITOT 1.1  --   --   ALBUMIN 2.5* 2.4* 2.5*   Cardiac Enzymes  Recent Labs Lab 08/15/2013 0750 08/04/2013 1005 08/22/13 0830  TROPONINI  --  <0.30 0.41*  PROBNP 11418.0*  --   --    Glucose  Recent Labs Lab 08/22/13 0824 08/22/13 1125 08/22/13 1554 08/22/13 1902 08/23/13 0019 08/23/13 0355  GLUCAP 148* 144* 195* 136* 131*  132*    Imaging US Renal Port  08/22/2013   CLINICAL DATA:  Decreased urinary output  EXAM: RENAL/URINARY TRACT ULTRASOUND COMPLETE  COMPARISON:  US RENAL dated 04/18/2013  FINDINGS: Right Kidney:  The renal cortical echotexture is equal to or slightly increased when compared to that of the adjacent liver. There are multiple cysts visible with the largest measuring 3 cm in greatest dimension in the lower pole. There is no hydronephrosis.  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 No left kidney is demonstrated.  Bladder:  The urinary bladder is decompressed with a Foley catheter.  IMPRESSION: The echotexture of the solitary right kidney is mildly increased consistent with medical renal disease. There is no hydronephrosis. There are multiple cysts in the right kidney with the largest measuring 3 cm in greatest dimension.   Electronically Signed   By: Bao  [place]   On: 08/22/2013 10:24   Dg Chest Port 1 View  08/22/2013   CLINICAL DATA:  Shortness of breath  EXAM: PORTABLE CHEST - 1 VIEW  COMPARISON:  08/08/2013  FINDINGS: Cardiac shadow is a again enlarged. Postsurgical changes are seen. A central venous catheter is noted from the left jugular vein at the junction of the innominate veins. It is stable in appearance. No pneumothorax is noted. No focal infiltrate is seen.  IMPRESSION: No change from the prior exam.   Electronically Signed   By: Alcide Clever M.D.   On: 08/22/2013 07:49   Dg Chest Portable 1 View  08/18/2013   CLINICAL DATA:  Sepsis.  Central line placement.  EXAM: PORTABLE CHEST - 1 VIEW  COMPARISON:  08/26/2013 at 0758 hr  FINDINGS: There has been interval placement of a left jugular central venous catheter with tip overlying the upper SVC with tip directed laterally. Marked enlargement of the cardiac silhouette remains. 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 Evaluation of the right lung is limited by technique. There is no definite evidence of airspace  consolidation or overt edema. No large pleural effusion or pneumothorax is identified.  IMPRESSION: Interval left jugular CVC placement as above.  Cardiomegaly.   Electronically Signed   By: Sebastian Ache   On: 08/31/2013 10:49   Dg Chest Port 1 View  08/10/2013   CLINICAL DATA:  Shortness of breath, history CHF, hypertension, diabetes, hyperlipidemia, former smoker  EXAM: PORTABLE CHEST - 1 VIEW  COMPARISON:  Portable exam 0758 hr compared to 07/09/2013  FINDINGS: Enlargement of cardiac silhouette post CABG.  Minimal atherosclerotic calcification aortic arch.  Pulmonary vascularity normal.  Poor definition of the left heart border cannot exclude mild lingular infiltrate.  Remaining lungs clear.  No gross pleural effusion or pneumothorax.  IMPRESSION: Enlargement of cardiac silhouette post CABG.  Indistinct left heart border, cannot exclude mild lingular infiltrate.   Electronically Signed   By: Ulyses Southward M.D.   On: 08/27/2013 08:07    CXR: retrocardiac infiltrates, int edema  ASSESSMENT / PLAN:  PULMONARY  A: Hypoxic respiratory failure with LLL infiltrate (PNA) and OSA.      Requiring Bipap     Heart failulre component now P:  - bipap plan 4 hrs on 2 hrs off x 24 hrs, to rest muscles in setting dni -neg balance with cvvhd  - DNI per patient's request - pcxr repeat now for edema - Milrinone gtt -control rate   CARDIOVASCULAR  A: Septic shock, a-fib on xeralto,  Systolic heart failure      Elevated BNP (11K) P:  - Levophed off,  milrinone gtt - Sepsis protocol dc - Heparin drip, bleeding on arm, dc  - Dobutamine drip off as rvr - trop 0.41, repeat today, ecg inferior old MI q waves - CVVHD - Pts cardio (Dr. Johney Frame) consulted. IF hR continues to risemay need dc milrinone - Avoid amio in the setting of lung toxicity - Avoid Tikosynin the setting of ARF.   - Avoid antiarrhythmic drugs with his LV dysfunction - will discuss use metoprolol or esmolol with cards? BP is improved and if  drops BP can use neo  RENAL  A: Acute on chronic renal failure and hypokalemia., large AG acidosis ( uremia, lactic) P: - Cr 4.49 (baseline 2.35) >> 6.64>>4.95 - CVVHD, phos improving electrolytes OK - UOP: 88cc/24 hr (+294 yesterday) - off pressors - Renal US:  The echotexture of the solitary right kidney is mildly increased consistent with medical renal disease. There is no hydronephrosis. There are multiple cysts in the right kidney with the largest measuring 3 cm in greatest dimension.  GASTROINTESTINAL  A: Abdominal pain, ?diarrhea.  P:  - C. Diff. Negative  - Diabetic diet.  - tr LFT, 5776/2100- repeat  - Plt 295 admission (2/18)>> 87 -acute hep neg -image ruq  HEMATOLOGIC  A: Leukocytosis. dvt prevention, chronic fib, thrombocytopenia dilutional? Consumptive? Hematoma arm P:  - Monitor CBC. Hb 6.9, HCt 21.1, PLt 87 - heparin drip off as hematoma arm - Platelet repeat with cvvhd volume off, repeat cbc 1600 -dc heparin, add sub q  INFECTIOUS  A: LLL PNA, nosocomial?, r/o endocardits P:  - Vanc/cefepime/levaquin - will narrow in am  - Pan culture above  - Narrow as able - pcxr in am; pending -unable to echo as super tachy for now -assess urine strep .  ENDOCRINE  A: DM. Chronic steroids then shock P:  - CBG and ISS as ordered.  - Stress dose steroids remain given adrenal status   NEUROLOGIC  A: Intermittent confusion when hypotensive. Resolved. P:  - Monitor -narcs for NIMV  TODAY'S SUMMARY: 58 year old male with extensive PMH presenting with LLL PNA and septic shock.nimv schedule NIMV, cont cvvhd, add beta blocker and neo if drop sBP, milrinone utility>?  Felix Pacini DO PGY-2 08/23/2013, 7:56 AM   Ccm time 30 min  Mcarthur Rossetti. Tyson Alias, MD, FACP Pgr: [phone] [facility]

## 2013-08-23 NOTE — Progress Notes (Signed)
Patient could not tolerate our bipap machine and wanted to go on his home cpap machine. RT placed patient on his home  cpap with 15 Lpm 02 bleed in. Patient is tolerating cpap well at this time.

## 2013-08-24 ENCOUNTER — Inpatient Hospital Stay (HOSPITAL_COMMUNITY): Payer: Managed Care, Other (non HMO)

## 2013-08-24 LAB — RENAL FUNCTION PANEL
Albumin: 2.5 g/dL — ABNORMAL LOW (ref 3.5–5.2)
BUN: 45 mg/dL — ABNORMAL HIGH (ref 6–23)
CHLORIDE: 89 meq/L — AB (ref 96–112)
CO2: 30 meq/L (ref 19–32)
CREATININE: 3.43 mg/dL — AB (ref 0.50–1.35)
Calcium: 7 mg/dL — ABNORMAL LOW (ref 8.4–10.5)
GFR calc Af Amer: 21 mL/min — ABNORMAL LOW (ref >90)
GFR, EST NON AFRICAN AMERICAN: 18 mL/min — AB (ref >90)
GLUCOSE: 131 mg/dL — AB (ref 70–99)
Phosphorus: 4.3 mg/dL (ref 2.3–4.6)
Potassium: 4.8 mEq/L (ref 3.7–5.3)
Sodium: 135 mEq/L — ABNORMAL LOW (ref 137–147)

## 2013-08-24 LAB — GLUCOSE, CAPILLARY
GLUCOSE-CAPILLARY: 129 mg/dL — AB (ref 70–99)
Glucose-Capillary: 128 mg/dL — ABNORMAL HIGH (ref 70–99)
Glucose-Capillary: 138 mg/dL — ABNORMAL HIGH (ref 70–99)

## 2013-08-24 LAB — CBC
HCT: 21.1 % — ABNORMAL LOW (ref 39.0–52.0)
HEMOGLOBIN: 6.8 g/dL — AB (ref 13.0–17.0)
MCH: 29.3 pg (ref 26.0–34.0)
MCHC: 32.2 g/dL (ref 30.0–36.0)
MCV: 90.9 fL (ref 78.0–100.0)
PLATELETS: 121 10*3/uL — AB (ref 150–400)
RBC: 2.32 MIL/uL — AB (ref 4.22–5.81)
RDW: 16.5 % — ABNORMAL HIGH (ref 11.5–15.5)
WBC: 22.7 10*3/uL — AB (ref 4.0–10.5)

## 2013-08-24 LAB — HEPATIC FUNCTION PANEL
ALT: 1168 U/L — ABNORMAL HIGH (ref 0–53)
AST: 896 U/L — ABNORMAL HIGH (ref 0–37)
Albumin: 2.5 g/dL — ABNORMAL LOW (ref 3.5–5.2)
Alkaline Phosphatase: 56 U/L (ref 39–117)
BILIRUBIN INDIRECT: 0.7 mg/dL (ref 0.3–0.9)
Bilirubin, Direct: 0.9 mg/dL — ABNORMAL HIGH (ref 0.0–0.3)
Total Bilirubin: 1.6 mg/dL — ABNORMAL HIGH (ref 0.3–1.2)
Total Protein: 5.7 g/dL — ABNORMAL LOW (ref 6.0–8.3)

## 2013-08-24 LAB — POCT I-STAT 3, ART BLOOD GAS (G3+)
Acid-Base Excess: 7 mmol/L — ABNORMAL HIGH (ref 0.0–2.0)
Bicarbonate: 31.7 mEq/L — ABNORMAL HIGH (ref 20.0–24.0)
O2 Saturation: 91 %
TCO2: 33 mmol/L (ref 0–100)
pCO2 arterial: 45 mmHg (ref 35.0–45.0)
pH, Arterial: 7.456 — ABNORMAL HIGH (ref 7.350–7.450)
pO2, Arterial: 58 mmHg — ABNORMAL LOW (ref 80.0–100.0)

## 2013-08-24 LAB — PREPARE RBC (CROSSMATCH)

## 2013-08-24 LAB — MAGNESIUM: Magnesium: 2.3 mg/dL (ref 1.5–2.5)

## 2013-08-24 MED ORDER — PRISMASOL BGK 4/2.5 32-4-2.5 MEQ/L IV SOLN
INTRAVENOUS | Status: DC
  Filled 2013-08-24 (×2): qty 5000

## 2013-08-24 MED ORDER — HEPARIN SODIUM (PORCINE) 1000 UNIT/ML DIALYSIS
1000.0000 [IU] | INTRAMUSCULAR | Status: DC | PRN
  Filled 2013-08-24: qty 6

## 2013-08-24 MED ORDER — VASOPRESSIN 20 UNIT/ML IJ SOLN
0.0300 [IU]/min | INTRAVENOUS | Status: DC
  Administered 2013-08-24: 0.03 [IU]/min via INTRAVENOUS
  Filled 2013-08-24: qty 2.5

## 2013-08-25 LAB — CULTURE, BLOOD (ROUTINE X 2)

## 2013-08-25 LAB — TYPE AND SCREEN
ABO/RH(D): A POS
Antibody Screen: NEGATIVE
UNIT DIVISION: 0

## 2013-08-26 NOTE — Telephone Encounter (Signed)
PT DECEASED

## 2013-09-01 NOTE — Progress Notes (Signed)
**Note De-Identified Anett Ranker Obfuscation** RT note: patient removed from BIPAP and placed on high flow nasal cannula.  Patient tolerating well.  RT to continue to monitor

## 2013-09-01 NOTE — Procedures (Signed)
Arterial Catheter Insertion Procedure Note LINSEY LOMBARDI [id number] 01/06/1956  Procedure: Insertion of Arterial Catheter  Indications: Blood pressure monitoring  Procedure Details Consent: Risks of procedure as well as the alternatives and risks of each were explained to the (patient/caregiver).  Consent for procedure obtained. Time Out: Verified patient identification, verified procedure, site/side was marked, verified correct patient position, special equipment/implants available, medications/allergies/relevent history reviewed, required imaging and test results available.  Performed  Maximum sterile technique was used including antiseptics, gloves, hand hygiene and sheet. Skin prep: Chlorhexidine; local anesthetic administered 20 gauge catheter was inserted into left radial artery using the Seldinger technique.  Evaluation Blood flow good; BP tracing good. Complications: No apparent complications.   Augustine Radar 08/06/2013

## 2013-09-01 NOTE — Progress Notes (Signed)
After performing pericare and linen change the patient's HOB was placed in semifowler's position. Once patient's head was elevated I noticed that he had a left upward fixed gaze and was unresponsive. Patient's cardiac rhythm changed from a-fib to a ventricular agonal rhythm and he respiratory arrested. Patient's partner [name] [name] was at the patient's bedside at the time of arrest and confirmed that the patient was not to receive CPR/Defib or intubation.  Dr. Tyson Alias was paged and informed of change in patient status and Dr. Birdie Sons confirmed cardiac death. Time of cardiac death 10:15. Upon examination pt was unresponsive, asystole on the monitor, heart and breath sounds were absent and pupils were fixed and dilated bilaterally.   Patient's partner was present at TOD and all personal belongings were released to him.

## 2013-09-01 NOTE — Progress Notes (Signed)
Patient is on Esmolol gtt at 150 mcg/kg/min & HR is afib 110's-120's(goal 65-105 per order). BP decreases as Esmolol is titrated, and Neo gtt is now maxed at 200 mcg. Spoke with CCM resident Dr. Raoul Pitch and will leave Esmolol at current rate even though HR goal has not been met.

## 2013-09-01 NOTE — Progress Notes (Signed)
eLink Physician-Brief Progress Note Patient Name: [name] [name] DOB: [birth year] MRN: [id number]  Date of Service  08/12/2013   HPI/Events of Note   hgb 6.8.   eICU Interventions  Transfuse 1 unit PRBC      [name], P 08/31/2013, 6:09 AM

## 2013-09-01 NOTE — Progress Notes (Signed)
CRITICAL VALUE ALERT  Critical value received:  Hgb 6.8  Date of notification:  2013-09-20  Time of notification:  0443  Critical value read back:yes  Nurse who received alert:  Crist Fat RN  MD notified (1st page):  Dr. Claiborne Billings notified in person at 0500 Time of first page:   NA  MD notified (2nd page):NA  Time of second page:NA  Responding MD:  Dr. Claiborne Billings  Time MD responded:  0500

## 2013-09-01 NOTE — Progress Notes (Signed)
Per patient's request BiPAP will be discontinued with no plans to restart. Due to previous reports of pt experiencing SOB and hypoxia pt will be placed on high flow O2 [place] and if not sufficient will place on [place] and NRB. Plan of care has been discussed with the patient and he has expressed his desire to remain a LCB, remain off of BiPAP and receive morphine PRN for symptom management. Will continue current medical treatments and provide symptom management for respiratory distress.

## 2013-09-01 NOTE — Progress Notes (Signed)
Patient ID: [name] [name], male   DOB: May 11, 1956, 58 y.o.   MRN: [id number] S:feels a little better  O:BP 110/42  Pulse 116  Temp(Src) 97.9 F (36.6 C) (Oral)  Resp 17  Ht 5\' 8"  (1.727 m)  Wt 113.7 kg (250 lb 10.6 oz)  BMI 38.12 kg/m2  SpO2 95%  Intake/Output Summary (Last 24 hours) at 08/07/2013 0856 Last data filed at 08/17/2013 0800  Gross per 24 hour  Intake 2772.17 ml  Output   3572 ml  Net -799.83 ml   Intake/Output: I/O last 3 completed shifts: In: 3176.1 [I.V.:2776.1; IV Piggyback:400] Out: 4437 [Urine:81; Other:4356]  Intake/Output this shift:  Total I/O In: 150.9 [I.V.:150.9] Out: 190 [Other:190] Weight change: -1.4 kg (-3 lb 1.4 oz) KCM:KLKJZ WM wearing nasal prongs for cpap PHX:TAVWPV HS, tachy, IRR IRR, no rub Resp:scattered rhonchi and exp wheezes XYI:AXKPVVZSM, obese Ext:tr pretib edema   Recent Labs Lab 08/18/2013 0750 08/22/13 0635 08/22/13 1600 08/23/13 0345 08/23/13 0900 08/23/13 1630 08/20/2013 0405  NA 135* 138 133* 136*  --  137 135*  K 3.8 5.5* 4.9 4.4  --  4.6 4.8  CL 99 101 97 94*  --  91* 89*  CO2 14* 13* 12* 22  --  25 30  GLUCOSE 111* 130* 159* 129*  --  122* 131*  BUN 66* 74* 81* 67*  --  51* 45*  CREATININE 4.49* 6.07* 6.64* 4.95*  --  3.94* 3.43*  ALBUMIN  --  2.5* 2.4* 2.5* 2.5* 2.4* 2.5*  2.5*  CALCIUM 9.0 7.1* 6.9* 7.0*  --  6.8* 7.0*  PHOS  --  8.6* 9.9* 5.8*  --  5.2* 4.3  AST  --  5776*  --   --  1561*  --  896*  ALT  --  2100*  --   --  1423*  --  1168*   Liver Function Tests:  Recent Labs Lab 08/22/13 0635  08/23/13 0900 08/23/13 1630 08/14/2013 0405  AST 5776*  --  1561*  --  896*  ALT 2100*  --  1423*  --  1168*  ALKPHOS 44  --  46  --  56  BILITOT 1.1  --  1.2  --  1.6*  PROT 6.0  --  5.8*  --  5.7*  ALBUMIN 2.5*  < > 2.5* 2.4* 2.5*  2.5*  < > = values in this interval not displayed. No results found for this basename: LIPASE, AMYLASE,  in the last 168 hours No results found for this basename: AMMONIA,  in the  last 168 hours CBC:  Recent Labs Lab 08/22/13 0635 08/22/13 1210 08/23/13 0345 08/23/13 1630 08/30/2013 0405  WBC 19.8* 18.7* 17.0* 19.2* 22.7*  NEUTROABS 18.2*  --   --   --   --   HGB 7.6* 7.8* 6.9* 7.0* 6.8*  HCT 24.2* 23.9* 21.1* 21.0* 21.1*  MCV 93.1 93.0 90.2 90.9 90.9  PLT 120* 86* 87* 96* 121*   Cardiac Enzymes:  Recent Labs Lab 08/30/2013 1005 08/22/13 0830 08/23/13 0910  TROPONINI <0.30 0.41* <0.30   CBG:  Recent Labs Lab 08/23/13 1546 08/23/13 1851 08/29/2013 0026 08/09/2013 0352 08/07/2013 0726  GLUCAP 125* 125* 138* 129* 128*    Iron Studies: No results found for this basename: IRON, TIBC, TRANSFERRIN, FERRITIN,  in the last 72 hours Studies/Results: US Abdomen Complete  08/23/2013   CLINICAL DATA:  Elevated liver function tests. History of solitary right kidney.  EXAM: ULTRASOUND ABDOMEN COMPLETE  COMPARISON:  Renal  ultrasound on 08/22/2013  FINDINGS: Gallbladder:  No gallstones or wall thickening visualized. No sonographic Murphy sign noted.  Common bile duct:  Diameter: 3 mm  Liver:  No focal lesion identified. Within normal limits in parenchymal echogenicity.  IVC:  No abnormality visualized.  Pancreas:  Not well visualized due to overlying bowel gas.  Spleen:  Size and appearance within normal limits.  Right Kidney:  Length: 14.6 cm. Mildly increased parenchymal echogenicity new again noted. No mass or hydronephrosis visualized. Several small cysts noted in the upper and lower poles  Left Kidney:  Length: No left kidney visualized.  Abdominal aorta:  No aneurysm visualized.  Other findings:  None.  IMPRESSION: No evidence of gallstones, biliary dilatation, or other acute findings.  Solitary right kidney with mildly increased parenchymal echogenicity, consistent with medical renal disease. No evidence hydronephrosis. .   Electronically Signed   By: Myles Rosenthal M.D.   On: 08/23/2013 16:18   US Renal Port  08/22/2013   CLINICAL DATA:  Decreased urinary output  EXAM:  RENAL/URINARY TRACT ULTRASOUND COMPLETE  COMPARISON:  US RENAL dated 04/18/2013  FINDINGS: Right Kidney:  The renal cortical echotexture is equal to or slightly increased when compared to that of the adjacent liver. There are multiple cysts visible with the largest measuring 3 cm in greatest dimension in the lower pole. There is no hydronephrosis.  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 No left kidney is demonstrated.  Bladder:  The urinary bladder is decompressed with a Foley catheter.  IMPRESSION: The echotexture of the solitary right kidney is mildly increased consistent with medical renal disease. There is no hydronephrosis. There are multiple cysts in the right kidney with the largest measuring 3 cm in greatest dimension.   Electronically Signed   By: Bilal  [place]   On: 08/22/2013 10:24   Dg Chest Port 1 View  08/31/2013   CLINICAL DATA:  Pneumonia, edema  EXAM: PORTABLE CHEST - 1 VIEW  COMPARISON:  08/23/2013  FINDINGS: Prior coronary bypass changes noted. Left IJ central line tip remains at the innominate venous confines level. Stable diffuse interstitial edema pattern throughout the lungs. Minimal improvement in the basilar aeration. No enlarging effusion or pneumothorax. Trachea is midline.  IMPRESSION: Cardiomegaly with persistent interstitial edema pattern compatible with CHF  Improving bibasilar airspace disease versus atelectasis.   Electronically Signed   By: Ruel Favors M.D.   On: 08/22/2013 08:08   Dg Chest Port 1 View  08/23/2013   CLINICAL DATA:  Followup edema  EXAM: PORTABLE CHEST - 1 VIEW  COMPARISON:  Portable chest x-ray of 08/22/2013  FINDINGS: There are now more prominent interstitial markings throughout the lungs most consistent with worsening of interstitial edema. More prominent markings at the lung bases may reflect edema, atelectasis, or pneumonia and followup is recommended. Cardiomegaly is stable. Left central venous line tip remains in  the proximal SVC.  IMPRESSION: Worsening of interstitial markings consistent with worsening of interstitial edema. Probable bibasilar atelectasis. Recommend followup.   Electronically Signed   By: Dwyane Dee M.D.   On: 08/23/2013 08:21   . antiseptic oral rinse  15 mL Mouth Rinse q12n4p  . busPIRone  15 mg Oral BID  . chlorhexidine  15 mL Mouth Rinse BID  . citalopram  20 mg Oral Daily  . heparin subcutaneous  5,000 Units Subcutaneous 3 times per day  . hydrocortisone sodium succinate  50 mg Intravenous Q6H  . insulin aspart  2-6 Units  Subcutaneous 6 times per day  . levofloxacin (LEVAQUIN) IV  250 mg Intravenous Q24H  . pantoprazole  40 mg Oral Q24H  . vancomycin  1,000 mg Intravenous Q24H    BMET    Component Value Date/Time   NA 135* 08/26/2013 0405   K 4.8 08/11/2013 0405   CL 89* 08/11/2013 0405   CO2 30 08/05/2013 0405   GLUCOSE 131* 08/22/2013 0405   BUN 45* 08/05/2013 0405   CREATININE 3.43* 08/28/2013 0405   CREATININE 2.36* 08/06/2013 1447   CALCIUM 7.0* 08/04/2013 0405   GFRNONAA 18* 08/26/2013 0405   GFRAA 21* 08/13/2013 0405   CBC    Component Value Date/Time   WBC 22.7* 08/17/2013 0405   RBC 2.32* 08/27/2013 0405   HGB 6.8* 08/31/2013 0405   HCT 21.1* 08/28/2013 0405   PLT 121* 08/19/2013 0405   MCV 90.9 08/13/2013 0405   MCH 29.3 08/17/2013 0405   MCHC 32.2 08/31/2013 0405   RDW 16.5* 08/06/2013 0405   LYMPHSABS 1.0 08/22/2013 0635   MONOABS 0.6 08/22/2013 0635   EOSABS 0.0 08/22/2013 0635   BASOSABS 0.0 08/22/2013 0635     Assessment/Plan:  1. AKI/CKD in setting of SIRS on pressors- oliguric.  1. Tolerating CVVHD well and volume removal although remains oliguric.  2. Mr. Littie DeedsGentry again informs me that he would not be interested in longterm HD but is fine with continuing with CVVHD for now. 3. Cont with short-term CVVHD to help correct his metabolic derangements and improve his volume status in hopes that his solitary kidney will start to show some recovery soon.  4. Renal  dose meds and avoid nephrotoxic agents 2. Hyperkalemia- due to #1 as above, resolved with CRRT  1. Stop KCl supplements 3. Metabolic acidosis- gap.  1. Improved with cvvhd and stop IV bicarb peripherally to avoid volume xs 2. Changes all replacement fluids to prismasate 4. SIRS- cont with abx and pressors per PCCM 5. A Fib with RVR- consult cardiology 6. PNA- as above 7. Dispo- pt is aware of his multiple medical problems and has a limited code.  He remains consistent that he does not want long term HD 8.   Antero Derosia A

## 2013-09-01 NOTE — Progress Notes (Signed)
Name: [name] [name] MRN: [id number] DOB: 06/20/1956    ADMISSION DATE:  08/27/2013 CONSULTATION DATE: 08/20/2013   REFERRING MD : Dr. Tomi Bamberger   PRIMARY SERVICE: PCCM   CHIEF COMPLAINT: SOB and hypotension.   BRIEF PATIENT DESCRIPTION: 58 year old male with extensive PMH and previous diagnosis of amiodarone induced pulmonary toxicity. Patient was in normal state of health until day PTP where he started noticing increase WOB and feeling dizzy and SOB. Patient came to the ED where he was found to have a SBP in the 60's. Lactic acid was elevated. Patient initially responded to IVF but then BP dropped again. Patient is chronically on steroids.   SIGNIFICANT EVENTS / STUDIES:  2/18 Septic shock from PNA.  2/18 Dobutamine  Infusion 2/18 C. Diff negative 2/19 Renal US: The echotexture of the solitary right kidney is mildly increased  consistent with medical renal disease. There is no hydronephrosis.  There are multiple cysts in the right kidney with the largest  measuring 3 cm in greatest dimension. 2/19- liver enzymes elevated, ARF worsening, distress, cvvhd 2/20 rvr remains  2/20: Echo: mildly dilated LV,LVH. mild concentric hypertrophy. Systolic EF 27% to 06%. Doppler parameters are consistent with a reversible restrictive pattern, (grade 3 diastolic dysfunction). Doppler parameters are consistent with both elevated ventricular end-diastolic filling pressure and elevated left atrial filling pressure. MR mild. TR moderate. Pa 45 mmHg 2/20 acute hepatitis panel neg 2/20 Abd [place]: No evidence of gallstones, biliary dilatation, or other acute findings.  Solitary right kidney with mildly increased parenchymal echogenicity, consistent with medical renal disease. No evidence hydronephrosis.  2/20 urine strep negative 2/20- esmolol, neo required  LINES / TUBES:  L IJ TLC 2/18>>>  L A-line 2/20 >>   CULTURES:  Blood 2/18>>> GRAM POSITIVE COCCI IN CLUSTERS X1 only Urine 2/18>>> NGTD  Sputum  2/18>>> needs to be collected  ANTIBIOTICS:  Cefepime 2/18>>>  Vanc 2/18>>>  Levaquin 2/18>>>   SUBJECTIVE: remains with resp failure  VITAL SIGNS: Temp:  [97.4 F (36.3 C)-98.1 F (36.7 C)] 98 F (36.7 C) (02/21 0354) Pulse Rate:  [68-157] 74 (02/21 0600) Resp:  [12-30] 15 (02/21 0600) BP: (74-119)/(37-79) 84/42 mmHg (02/21 0600) SpO2:  [87 %-99 %] 95 % (02/21 0600) FiO2 (%):  [50 %-60 %] 50 % (02/21 0500) Weight:  [250 lb 10.6 oz (113.7 kg)] 250 lb 10.6 oz (113.7 kg) (02/21 0431) HEMODYNAMICS: CVP:  [17 mmHg-21 mmHg] 17 mmHg VENTILATOR SETTINGS: Vent Mode:  [-]  FiO2 (%):  [50 %-60 %] 50 % INTAKE / OUTPUT: Intake/Output     02/20 0701 - 02/21 0700   I.V. (mL/kg) 2160 (19)   IV Piggyback 350   Total Intake(mL/kg) 2510 (22.1)   Urine (mL/kg/hr) 23 (0)   Other 3243 (1.2)   Total Output 3266   Net -756       Stool Occurrence 3 x    BP 84/42  Pulse 74  Temp(Src) 98 F (36.7 C) (Oral)  Resp 15  Ht 5' 8"  (1.727 m)  Wt 250 lb 10.6 oz (113.7 kg)  BMI 38.12 kg/m2  SpO2 95%  PHYSICAL EXAMINATION: General: Chronically ill appearing obese male. On bipap Neuro: Alert and interactive, moving all ext to command.  HEENT: jvd plus Cardiovascular: Tachy, IRIR, Nl S1/S2, -M/R/G.  Lungs: Decreased at the bases. CTAB Abdomen: Soft, NT, ND and +BS.  Musculoskeletal: Intact.  Skin: Intact. Large hematoma left bicep area.   LABS:  CBC  Recent Labs Lab 08/23/13 0345 08/23/13  1630 09/23/13 0405  WBC 17.0* 19.2* 22.7*  HGB 6.9* 7.0* 6.8*  HCT 21.1* 21.0* 21.1*  PLT 87* 96* 121*   Coag's  Recent Labs Lab 08/22/13 0635 08/22/13 2200 08/23/13 0345  APTT 55* 75* 95*   BMET  Recent Labs Lab 08/23/13 0345 08/23/13 1630 09-23-13 0405  NA 136* 137 135*  K 4.4 4.6 4.8  CL 94* 91* 89*  CO2 22 25 30   BUN 67* 51* 45*  CREATININE 4.95* 3.94* 3.43*  GLUCOSE 129* 122* 131*   Electrolytes  Recent Labs Lab 08/22/13 0635  08/23/13 0345 08/23/13 1630  September 23, 2013 0405  CALCIUM 7.1*  < > 7.0* 6.8* 7.0*  MG 1.1*  --  2.2  --  2.3  PHOS 8.6*  < > 5.8* 5.2* 4.3  < > = values in this interval not displayed. Sepsis Markers  Recent Labs Lab 08/14/2013 1005 08/06/2013 1630 08/29/2013 2000  LATICACIDVEN 6.6* 5.0* 4.9*   ABG  Recent Labs Lab 08/23/2013 0813 2013-09-23 0403  PHART 7.444 7.456*  PCO2ART 22.2* 45.0  PO2ART 102.0* 58.0*   Liver Enzymes  Recent Labs Lab 08/22/13 0635  08/23/13 0900 08/23/13 1630 Sep 23, 2013 0405  AST 5776*  --  1561*  --  896*  ALT 2100*  --  1423*  --  1168*  ALKPHOS 44  --  46  --  56  BILITOT 1.1  --  1.2  --  1.6*  ALBUMIN 2.5*  < > 2.5* 2.4* 2.5*  2.5*  < > = values in this interval not displayed. Cardiac Enzymes  Recent Labs Lab 08/26/2013 0750 08/11/2013 1005 08/22/13 0830 08/23/13 0910  TROPONINI  --  <0.30 0.41* <0.30  PROBNP 11418.0*  --   --   --    Glucose  Recent Labs Lab 08/23/13 0758 08/23/13 1156 08/23/13 1546 08/23/13 1851 09-23-2013 0026 09-23-13 0352  GLUCAP 130* 146* 125* 125* 138* 129*    Imaging US Abdomen Complete  08/23/2013   CLINICAL DATA:  Elevated liver function tests. History of solitary right kidney.  EXAM: ULTRASOUND ABDOMEN COMPLETE  COMPARISON:  Renal ultrasound on 08/22/2013  FINDINGS: Gallbladder:  No gallstones or wall thickening visualized. No sonographic Murphy sign noted.  Common bile duct:  Diameter: 3 mm  Liver:  No focal lesion identified. Within normal limits in parenchymal echogenicity.  IVC:  No abnormality visualized.  Pancreas:  Not well visualized due to overlying bowel gas.  Spleen:  Size and appearance within normal limits.  Right Kidney:  Length: 14.6 cm. Mildly increased parenchymal echogenicity new again noted. No mass or hydronephrosis visualized. Several small cysts noted in the upper and lower poles  Left Kidney:  Length: No left kidney visualized.  Abdominal aorta:  No aneurysm visualized.  Other findings:  None.  IMPRESSION: No evidence of  gallstones, biliary dilatation, or other acute findings.  Solitary right kidney with mildly increased parenchymal echogenicity, consistent with medical renal disease. No evidence hydronephrosis. .   Electronically Signed   By: Earle Gell M.D.   On: 08/23/2013 16:18   US Renal Port  08/22/2013   CLINICAL DATA:  Decreased urinary output  EXAM: RENAL/URINARY TRACT ULTRASOUND COMPLETE  COMPARISON:  US RENAL dated 04/18/2013  FINDINGS: Right Kidney:  The renal cortical echotexture is equal to or slightly increased when compared to that of the adjacent liver. There are multiple cysts visible with the largest measuring 3 cm in greatest dimension in the lower pole. There is no hydronephrosis.  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 No left kidney is demonstrated.  Bladder:  The urinary bladder is decompressed with a Foley catheter.  IMPRESSION: The echotexture of the solitary right kidney is mildly increased consistent with medical renal disease. There is no hydronephrosis. There are multiple cysts in the right kidney with the largest measuring 3 cm in greatest dimension.   Electronically Signed   By: Orvel  [place]   On: 08/22/2013 10:24   Dg Chest Port 1 View  08/23/2013   CLINICAL DATA:  Followup edema  EXAM: PORTABLE CHEST - 1 VIEW  COMPARISON:  Portable chest x-ray of 08/22/2013  FINDINGS: There are now more prominent interstitial markings throughout the lungs most consistent with worsening of interstitial edema. More prominent markings at the lung bases may reflect edema, atelectasis, or pneumonia and followup is recommended. Cardiomegaly is stable. Left central venous line tip remains in the proximal SVC.  IMPRESSION: Worsening of interstitial markings consistent with worsening of interstitial edema. Probable bibasilar atelectasis. Recommend followup.   Electronically Signed   By: Ivar Drape M.D.   On: 08/23/2013 08:21   Dg Chest Port 1 View  08/22/2013   CLINICAL DATA:   Shortness of breath  EXAM: PORTABLE CHEST - 1 VIEW  COMPARISON:  08/23/2013  FINDINGS: Cardiac shadow is a again enlarged. Postsurgical changes are seen. A central venous catheter is noted from the left jugular vein at the junction of the innominate veins. It is stable in appearance. No pneumothorax is noted. No focal infiltrate is seen.  IMPRESSION: No change from the prior exam.   Electronically Signed   By: Inez Catalina M.D.   On: 08/22/2013 07:49    CXR: slight improvement basilar  ASSESSMENT / PLAN:  PULMONARY  A: Hypoxic respiratory failure with LLL infiltrate (PNA) and OSA.      Requiring Bipap     Heart failulre component now P:  - bipap plan 4 hrs on 2 hrs off x 24 hrs, to rest muscles in setting dni, HE MUST HAVE SOME INTERPTION BIPAP, attempt 20 min off with 100% and [place] O2 - neg balance with cvvhd (~25cc/hr) - DNI per patient's request - pcxr pending  - control rate, minimal control on esmolol overnight.Goals not completely met d/t max neo needed.  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   - Avoid antiarrhythmic drugs with his LV dysfunction - 2/20: Echo: mildly dilated LV,LVH. mild concentric hypertrophy. Systolic EF 25% to 42%. Doppler parameters are consistent with a reversible restrictive pattern, (grade 3 diastolic dysfunction). Doppler parameters are consistent with both elevated ventricular end-diastolic filling pressure and elevated left atrial filling pressure. MR mild. TR moderate. Pa 45 mmHg -consider dc milrinone,  unclear if improving his status  RENAL  A: Acute on chronic renal failure and hypokalemia., large AG acidosis ( uremia, lactic) P: - Cr 4.49 (baseline 2.35) >> 6.64>>4.95>>3.43 - CVVHD, neg balance  (~25cc/hr) overnight - Hyponatremic (  135), hypochloremic (89) - resolving - UOP: 23cc/24 hr (-756 ml yesterday documented) - Need for max neo with esmolol    GASTROINTESTINAL  A: Abdominal pain, ?diarrhea.  P:  - C. Diff. Negative  - Diabetic diet.  - tr LFT, improving 896/1168, tbili 1.6, direct 0.9, follow bili trend - Plt 295 admission (2/18)>> 87 - ABd [place] normal   HEMATOLOGIC  A: Leukocytosis. dvt prevention, chronic fib, thrombocytopenia dilutional? Consumptive? Hematoma arm P:  - Monitor CBC. Hb 6.9 >7.0>6.8, HCt 21.0, PLt 121 - heparin drip off as hematoma arm - hep sub q  INFECTIOUS  A: LLL PNA, nosocomial?, r/o endocardits, likely contaminant await ID P:  - Vanc/ (dc) cefepime/levaquin - will narrow as culture neg overall, but keep vanc until ID - Pan culture above  - pcxr pending -  urine strep negative .   ENDOCRINE  A: DM. Chronic steroids then shock P:  - CBG and ISS as ordered.  - Stress dose steroids remain given adrenal status   NEUROLOGIC  A: Intermittent confusion when hypotensive. Resolved. P:  - Monitor -narcs for NIMV  TODAY'S SUMMARY: 58 year old male with extensive PMH presenting with LLL PNA and septic shock, need interrupt NIMV, add vaso  Kuneff, Renee DO PGY-2 10-Sep-2013, 6:23 AM  Ccm time 30 min I have fully examined this patient and agree with above findings.    And edited in full  Lavon Paganini. Titus Mould, MD, [place] [facility]: [facility]

## 2013-09-01 DEATH — deceased

## 2013-09-03 NOTE — Discharge Summary (Addendum)
NAME:  [name] [name], [name] [name] NO.:  [id number]  MEDICAL RECORD NO.:  [id number]  LOCATION:                                 FACILITY:  PHYSICIAN:  Nelda Bucks, MD DATE OF BIRTH:  October 15, 1955  DATE OF ADMISSION:  08/27/2013 DATE OF DISCHARGE:  09-14-13                              DISCHARGE SUMMARY   DEATH SUMMARY  This is a 58 year old male with extensive past medical history including previous diagnosis of amiodarone induced pulmonary toxicity, heart failure.  The patient was in his normal state of health until the day prior to admission when noticed increasing work of breathing, feeling dizzy, shortness of breath, came to the emergency room where he was found to have a blood pressure in the 60s, lactic acidosis.  The patient initially responded to fluid resuscitation.  Blood pressure dropped again and he is chronically on steroids for which he was given steroid supplementation.  Significant events for this patient including February 18th, having concerns for septic shock from pneumonia; on February 18th, requiring dobutamine infusion with worsening heart failure noted; February 18th, C. diff which was negative; February 19th, secondary to kidney failure showing a renal ultrasound with a solitary kidney.  No hydronephrosis.  On February 19th had massive shock liver, elevated enzymes secondary to congestive hepatopathy, septic shock, worsening acute renal failure, CVHD, was started on February 20th.  Patient remained in AFib, RVR, which is contributing to his heart failure.  To note, the patient from the admission did not want to be intubated under any circumstances, not want heroic measures, given his known heart failure.  He had an echo at 02:20 with cardiology consultation showed ejection fraction 35-45% consistent with reversible restrictive pattern of grade 3 diastolic dysfunction.  He had elevated left ventricular end- diastolic filling pressures and  moderate tricuspid regurgitation with PA pressure of 45.  The patient had acute hepatitis panel, which is negative.  Patient had abdominal ultrasound.  No evidence of gallstones, biliary duct dilatation, or acute findings.  The patient noted on February 20th, have a urine strep which was negative and on February 20th, the patient __________ started and Neo-Synephrine to support blood pressure secondary to limited options for AFib, RVR, causing worsening heart failure.  Patient made it crystal clear he had been on BiPAP during this entire time, making zero progress with continued multiorgan failure, liver failure, renal failure, and known congestive heart failure, and opted to limit noninvasive mask ventilation and comfort further.  With that decision being made solely by him, alert and oriented x3 __________ partner.  The patient remained in some degree of respiratory distress.  He was __________ for a bowel movement and suffered a complete asystole and expired.  FINAL DIAGNOSES UPON DEATH: 1. Pneumonia. 2. Heart failure, systolic and diastolic. 3. Acute hepatitis secondary to congestive hepatopathy and sepsis. 4. Acute renal failure secondary to likely contribution from heart     Failure. 5. Hyperphosphatemia      Nelda Bucks, MD     DJF/MEDQ  D:  09/02/2013  T:  09/03/2013  Job:  [id number]

## 2013-09-04 ENCOUNTER — Ambulatory Visit: Payer: Managed Care, Other (non HMO) | Admitting: Internal Medicine

## 2013-09-16 ENCOUNTER — Ambulatory Visit: Payer: Managed Care, Other (non HMO) | Admitting: Internal Medicine

## 2014-06-12 ENCOUNTER — Encounter (HOSPITAL_COMMUNITY): Payer: Self-pay | Admitting: Cardiology

## 2015-12-11 IMAGING — CR DG CHEST 2V
2 series · 2 of 2 positions shown · non-contrast
Comparison: 06/12/2013 plain film and CT 06/10/2013

CLINICAL DATA: Shortness of breath. History of CHF. Amiodarone
toxicity.

EXAM:
CHEST  2 VIEW

[w chest pa]
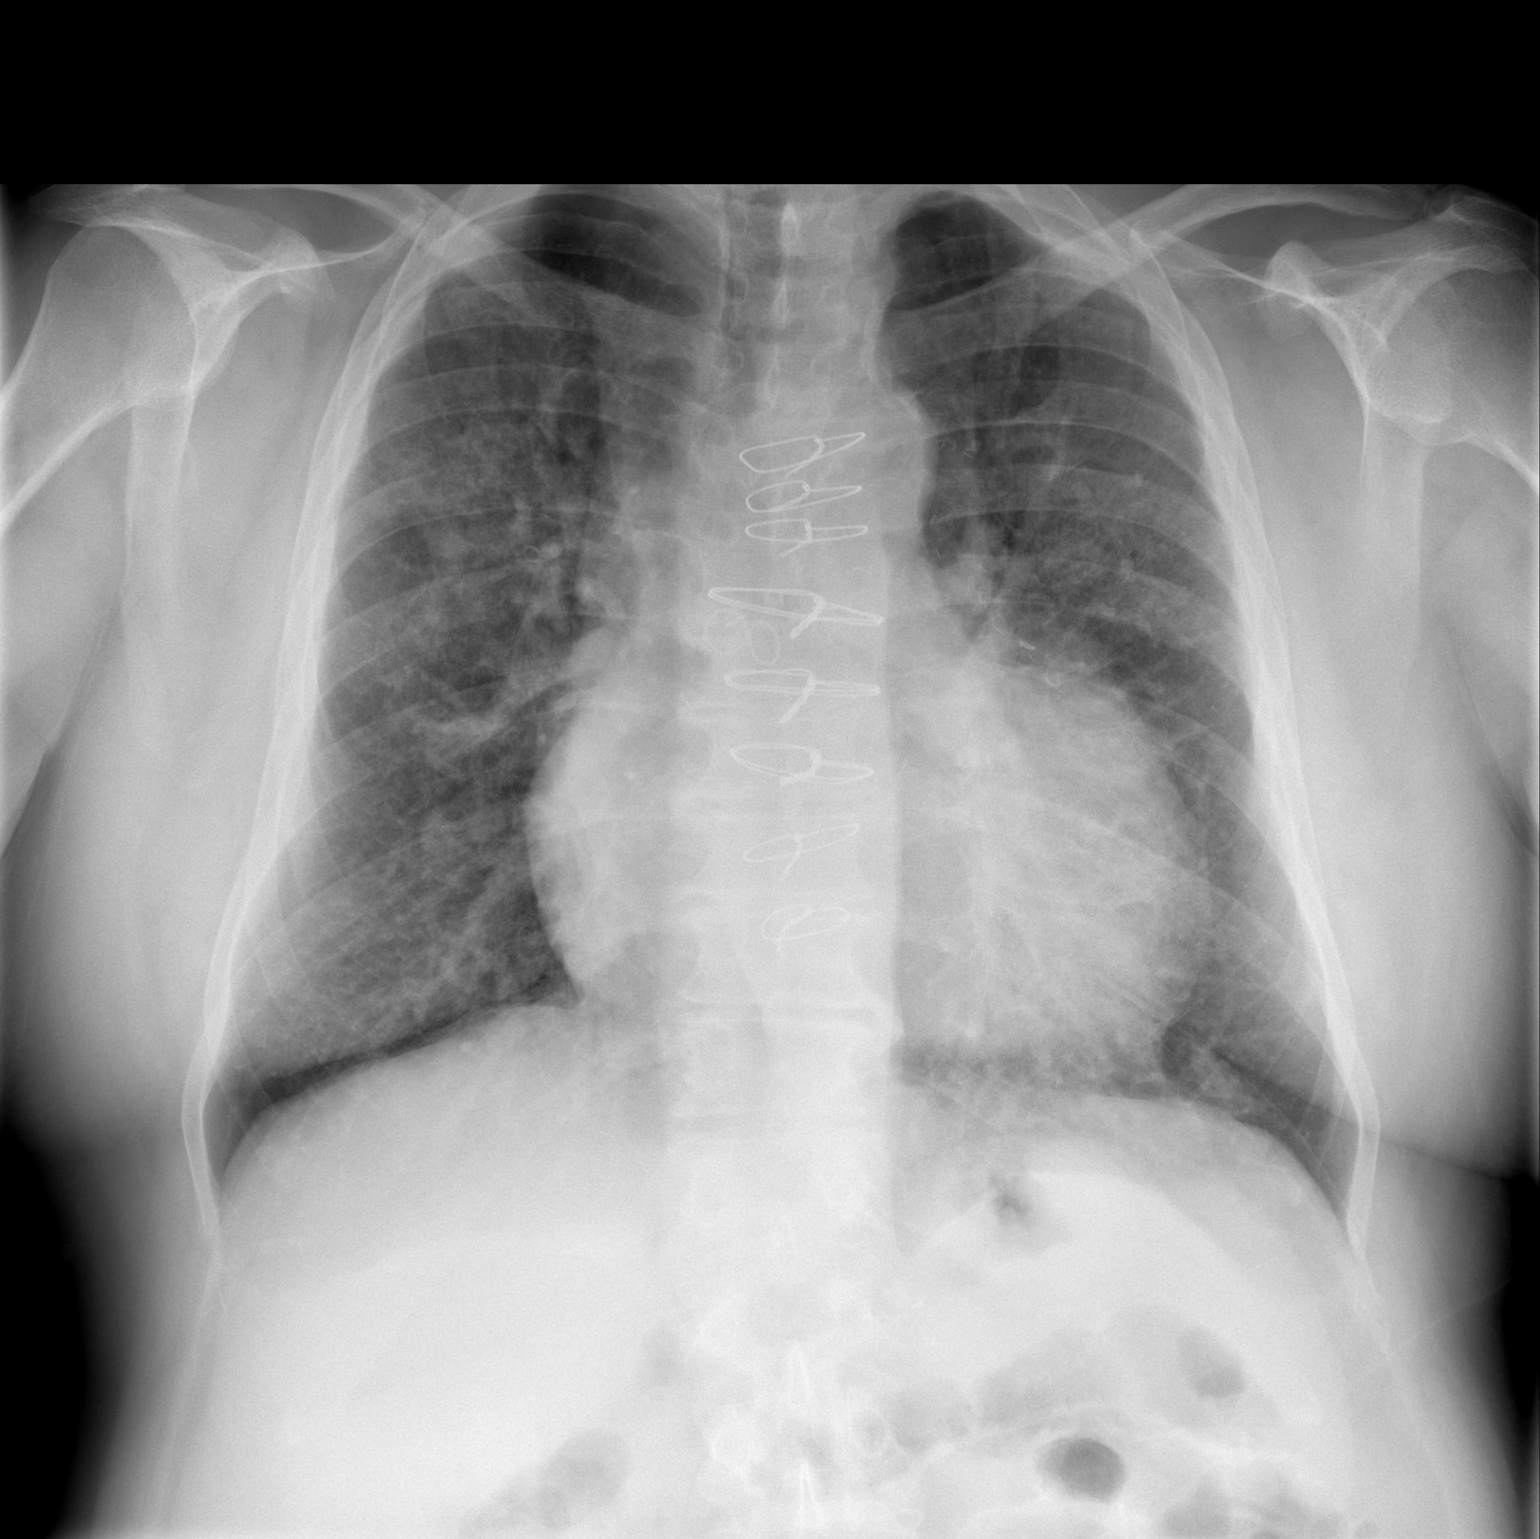

[w chest lat]
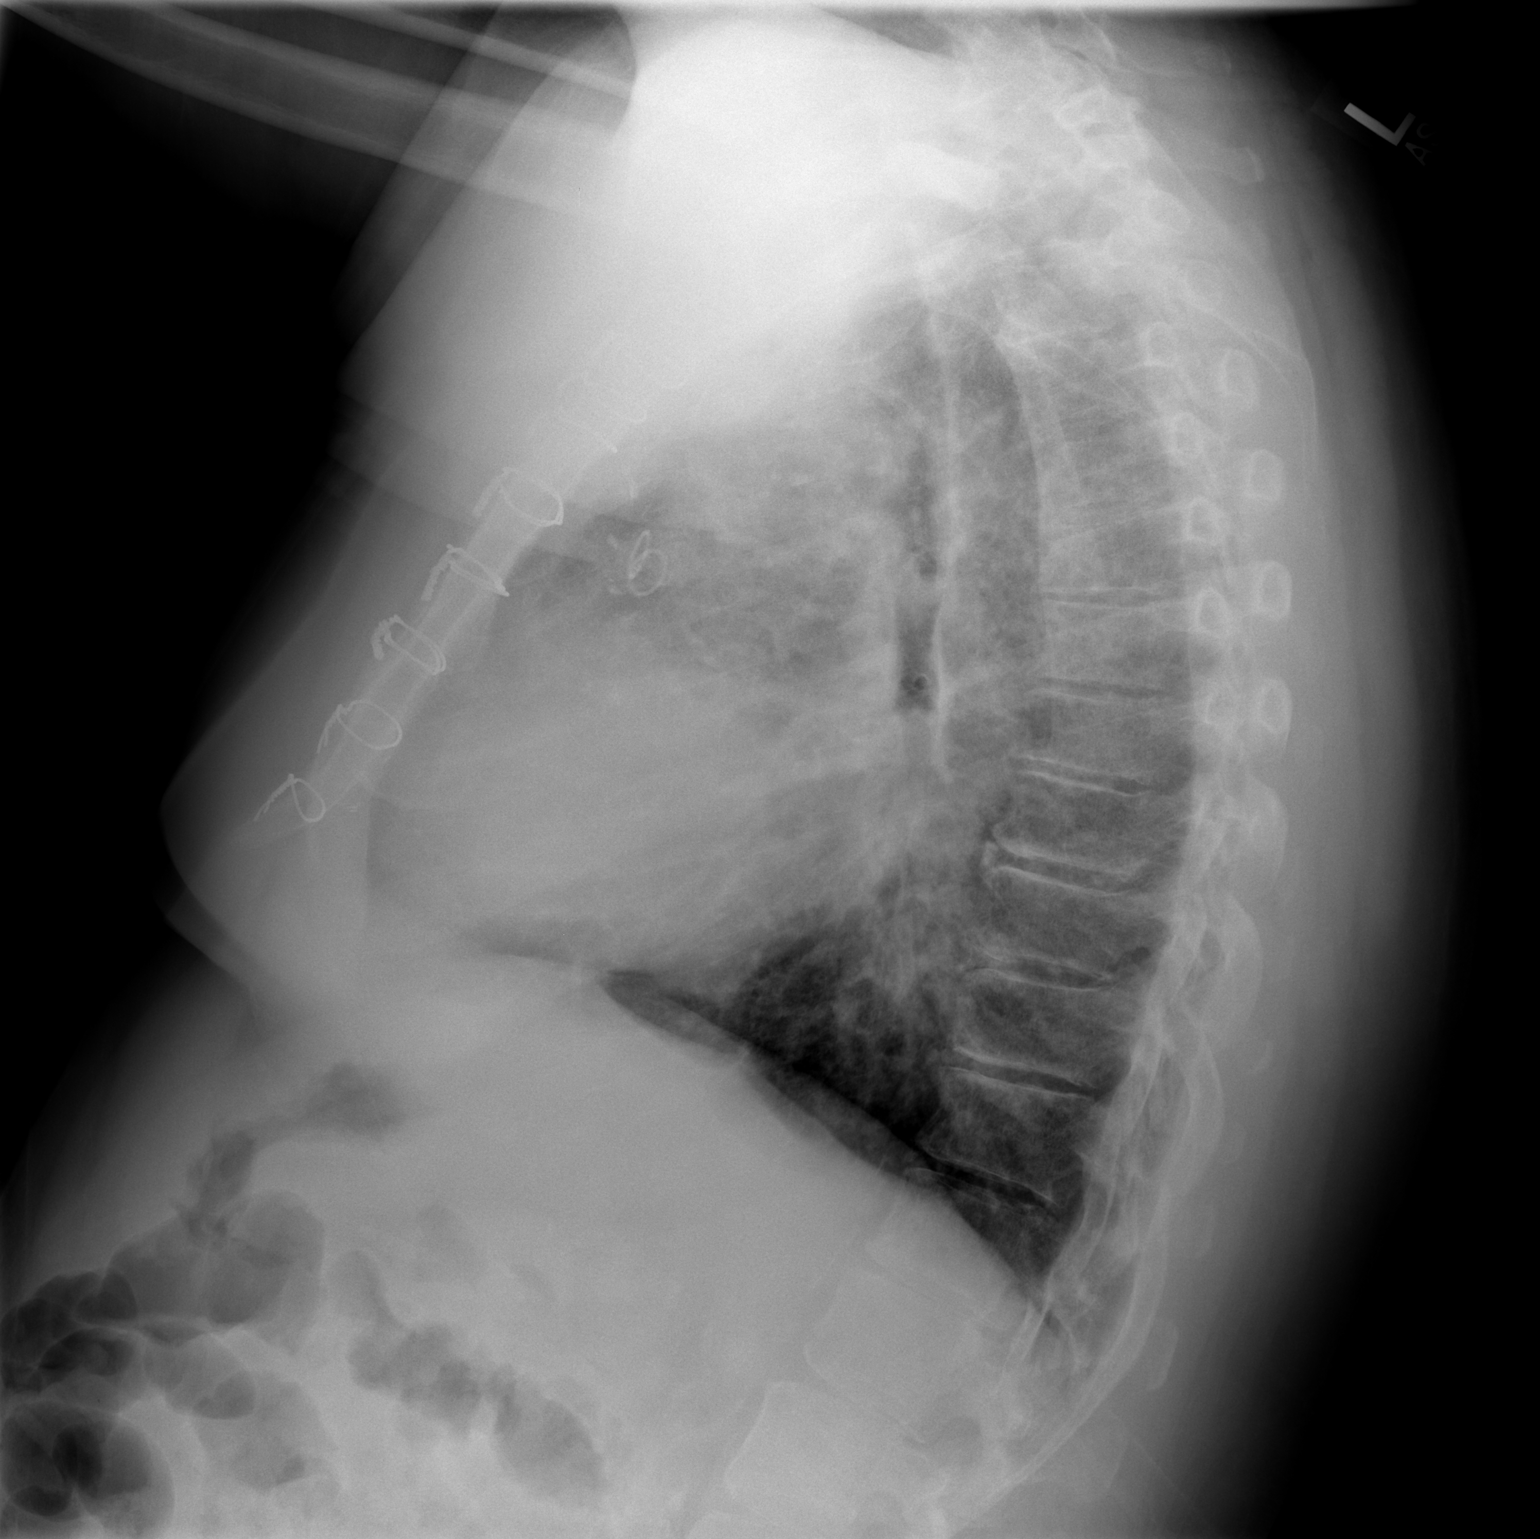

[2 of 2 positions shown; findings below may reference images not displayed]

FINDINGS: Prior median sternotomy. Lateral view degraded by patient arm
position. Midline trachea. Moderate cardiomegaly. Mediastinal
contours otherwise within normal limits. No pleural effusion or
pneumothorax. Since 06/12/2013, slight decrease in interstitial
prominence and indistinctness. No lobar consolidation.
IMPRESSION: Cardiomegaly with slight improvement in interstitial prominence,
likely related to mild interstitial edema.
